# Patient Record
Sex: Male | Born: 1940 | Race: White | Hispanic: No | Marital: Married | Smoking: Former smoker
Health system: Southern US, Community
[De-identification: ages and names within clinical notes are randomized; demographics above are authoritative.]

## PROBLEM LIST (undated history)

## (undated) DIAGNOSIS — E7800 Pure hypercholesterolemia, unspecified: Secondary | ICD-10-CM

## (undated) DIAGNOSIS — Z973 Presence of spectacles and contact lenses: Secondary | ICD-10-CM

## (undated) DIAGNOSIS — I2510 Atherosclerotic heart disease of native coronary artery without angina pectoris: Secondary | ICD-10-CM

## (undated) DIAGNOSIS — I10 Essential (primary) hypertension: Secondary | ICD-10-CM

## (undated) DIAGNOSIS — G4700 Insomnia, unspecified: Secondary | ICD-10-CM

## (undated) DIAGNOSIS — J449 Chronic obstructive pulmonary disease, unspecified: Secondary | ICD-10-CM

## (undated) DIAGNOSIS — E785 Hyperlipidemia, unspecified: Secondary | ICD-10-CM

## (undated) DIAGNOSIS — R7301 Impaired fasting glucose: Secondary | ICD-10-CM

## (undated) DIAGNOSIS — E119 Type 2 diabetes mellitus without complications: Secondary | ICD-10-CM

## (undated) DIAGNOSIS — C61 Malignant neoplasm of prostate: Secondary | ICD-10-CM

## (undated) DIAGNOSIS — Z972 Presence of dental prosthetic device (complete) (partial): Secondary | ICD-10-CM

## (undated) DIAGNOSIS — C801 Malignant (primary) neoplasm, unspecified: Secondary | ICD-10-CM

## (undated) DIAGNOSIS — N400 Enlarged prostate without lower urinary tract symptoms: Secondary | ICD-10-CM

## (undated) DIAGNOSIS — N419 Inflammatory disease of prostate, unspecified: Secondary | ICD-10-CM

## (undated) DIAGNOSIS — I4891 Unspecified atrial fibrillation: Secondary | ICD-10-CM

## (undated) DIAGNOSIS — N179 Acute kidney failure, unspecified: Secondary | ICD-10-CM

## (undated) HISTORY — PX: HX TURP: SHX73

## (undated) HISTORY — DX: Malignant neoplasm of prostate (CMS HCC): C61

## (undated) HISTORY — PX: KNEE MASS EXCISION: SHX1896

## (undated) HISTORY — DX: Inflammatory disease of prostate, unspecified: N41.9

## (undated) HISTORY — DX: Benign prostatic hyperplasia without lower urinary tract symptoms: N40.0

## (undated) HISTORY — DX: Essential (primary) hypertension: I10

## (undated) HISTORY — DX: Type 2 diabetes mellitus without complications (CMS HCC): E11.9

## (undated) HISTORY — DX: Acute kidney failure, unspecified (CMS HCC): N17.9

## (undated) HISTORY — PX: HX CORONARY ARTERY BYPASS GRAFT: SHX141

## (undated) HISTORY — DX: Impaired fasting glucose: R73.01

## (undated) HISTORY — DX: Hyperlipidemia, unspecified: E78.5

## (undated) HISTORY — DX: Insomnia, unspecified: G47.00

## (undated) HISTORY — DX: Atherosclerotic heart disease of native coronary artery without angina pectoris: I25.10

## (undated) HISTORY — DX: Chronic obstructive pulmonary disease, unspecified: J44.9

---

## 1997-09-16 ENCOUNTER — Inpatient Hospital Stay
Admission: AD | Admit: 1997-09-16 | Disposition: A | Discharge status: Home / Self Care | Payer: Self-pay | Source: Other Acute Inpatient Hospital

## 2001-09-29 ENCOUNTER — Ambulatory Visit: Payer: Self-pay

## 2002-04-13 ENCOUNTER — Ambulatory Visit: Payer: Self-pay

## 2005-10-18 ENCOUNTER — Ambulatory Visit (HOSPITAL_COMMUNITY): Payer: Self-pay | Admitting: Family Medicine

## 2009-11-01 ENCOUNTER — Inpatient Hospital Stay
Admission: RE | Admit: 2009-11-01 | Discharge: 2009-11-05 | Disposition: A | Discharge status: Home / Self Care | Payer: Self-pay | Attending: Internal Medicine | Admitting: Internal Medicine

## 2009-11-01 ENCOUNTER — Other Ambulatory Visit: Payer: Self-pay

## 2009-11-01 ENCOUNTER — Emergency Department: Admit: 2009-11-01 | Payer: Self-pay

## 2009-11-02 LAB — ACTIVATED COAGULATION TIME - BMC ONLY
ACTIVATED COAGULATION TIME: 207 s — ABNORMAL HIGH (ref 76–180)
ACTIVATED COAGULATION TIME: 247 s — ABNORMAL HIGH (ref 76–180)

## 2009-11-03 ENCOUNTER — Other Ambulatory Visit: Payer: Self-pay | Admitting: Internal Medicine

## 2009-11-03 LAB — COMPREHENSIVE METABOLIC PROFILE - BMC/JMC ONLY
ALBUMIN: 3.1 g/dL — ABNORMAL LOW (ref 3.2–5.0)
ALKALINE PHOSPHATASE: 77 IU/L (ref 35–120)
ALT (SGPT): 26 IU/L (ref 0–63)
AST (SGOT): 61 IU/L — ABNORMAL HIGH (ref 0–45)
BILIRUBIN, TOTAL: 0.7 mg/dL (ref 0.0–1.3)
BUN: 10 mg/dL (ref 6–22)
CALCIUM: 9 mg/dL (ref 8.5–10.5)
CARBON DIOXIDE: 31 mmol/L (ref 22–32)
CHLORIDE: 102 mmol/L (ref 101–111)
CREATININE: 0.89 mg/dL (ref 0.72–1.30)
ESTIMATED GLOMERULAR FILTRATION RATE: >60 mL/min (ref >60)
GLUCOSE: 94 mg/dL (ref 70–110)
POTASSIUM: 4.6 mmol/L (ref 3.5–5.0)
SODIUM: 138 mmol/L (ref 136–145)
TOTAL PROTEIN: 5.8 g/dL — ABNORMAL LOW (ref 6.0–8.0)

## 2009-11-03 LAB — CBC
BASOPHIL #: 0.09 K/uL (ref 0.00–0.10)
BASOPHILS %: 0.9 % (ref 0.0–1.4)
EOSINOPHIL #: 0.31 K/uL (ref 0.00–0.50)
EOSINOPHIL %: 3.2 % (ref 0.0–5.2)
HCT: 39.9 % (ref 39.0–50.0)
HGB: 13.4 g/dL — ABNORMAL LOW (ref 13.5–18.0)
LYMPHOCYTE #: 1.85 K/uL (ref 0.70–3.20)
LYMPHOCYTE %: 18.9 % (ref 15.0–43.0)
MCH: 30.6 pg (ref 28.0–34.0)
MCHC: 33.7 g/dL (ref 33.0–37.0)
MCV: 90.8 fL (ref 83.0–97.0)
MONOCYTE #: 0.71 K/uL (ref 0.20–0.90)
MONOCYTE %: 7.2 % (ref 4.8–12.0)
MPV: 10.6 fL — ABNORMAL HIGH (ref 7.0–9.4)
PLATELET COUNT: 166 K/uL (ref 150–400)
PMN #: 6.85 K/uL — ABNORMAL HIGH (ref 1.50–6.50)
PMN %: 69.8 % (ref 43.0–76.0)
RBC: 4.39 M/uL (ref 4.30–5.40)
RDW: 11.8 % (ref 11.0–13.0)
WBC: 9.8 K/uL (ref 4.0–11.0)

## 2009-11-03 LAB — CREATINE KINASE (CK), TOTAL, SERUM OR PLASMA: CREATINE KINASE (CK): 389 IU/L (ref 0–250)

## 2009-11-03 LAB — PHOSPHORUS: PHOSPHORUS: 2.7 mg/dL (ref 2.4–4.7)

## 2009-11-03 LAB — PT/INR
INR NORMALIZED: 1.16
PROTHROMBIN TIME: 12 s — ABNORMAL HIGH (ref 9.8–11.0)

## 2009-11-03 LAB — TROPONIN-I: TROPONIN-I: 13.58 ng/mL — ABNORMAL HIGH (ref 0.00–0.06)

## 2009-11-03 LAB — MAGNESIUM: MAGNESIUM: 1.6 mg/dL — ABNORMAL LOW (ref 1.7–2.5)

## 2009-11-03 LAB — CREATINE KINASE (CK), MB FRACTION, SERUM: CK-MB: 45.7 ng/mL (ref 0.0–6.3)

## 2009-11-03 LAB — PTT (PARTIAL THROMBOPLASTIN TIME): APTT: 30.6 s (ref 24.1–32.3)

## 2009-11-04 ENCOUNTER — Other Ambulatory Visit: Payer: Self-pay | Admitting: Internal Medicine

## 2009-11-04 ENCOUNTER — Other Ambulatory Visit: Payer: Self-pay

## 2009-11-04 LAB — CBC
BASOPHIL #: 0.06 K/uL (ref 0.00–0.10)
BASOPHILS %: 0.6 % (ref 0.0–1.4)
EOSINOPHIL #: 0.35 K/uL (ref 0.00–0.50)
EOSINOPHIL %: 3.6 % (ref 0.0–5.2)
HCT: 38.5 % — ABNORMAL LOW (ref 39.0–50.0)
HGB: 13.5 g/dL (ref 13.5–18.0)
LYMPHOCYTE #: 1.78 K/uL (ref 0.70–3.20)
LYMPHOCYTE %: 18.5 % (ref 15.0–43.0)
MCH: 31.3 pg (ref 28.0–34.0)
MCHC: 35 g/dL (ref 33.0–37.0)
MCV: 89.4 fL (ref 83.0–97.0)
MONOCYTE #: 0.87 K/uL (ref 0.20–0.90)
MONOCYTE %: 9.1 % (ref 4.8–12.0)
MPV: 10 fL — ABNORMAL HIGH (ref 7.0–9.4)
PLATELET COUNT: 149 K/uL — ABNORMAL LOW (ref 150–400)
PMN #: 6.57 K/uL — ABNORMAL HIGH (ref 1.50–6.50)
PMN %: 68.3 % (ref 43.0–76.0)
RBC: 4.3 M/uL (ref 4.30–5.40)
RDW: 11.5 % (ref 11.0–13.0)
WBC: 9.6 K/uL (ref 4.0–11.0)

## 2009-11-04 LAB — BASIC METABOLIC PROFILE - BMC/JMC ONLY
BUN: 12 mg/dL (ref 6–22)
CALCIUM: 8.7 mg/dL (ref 8.5–10.5)
CARBON DIOXIDE: 30 mmol/L (ref 22–32)
CHLORIDE: 102 mmol/L (ref 101–111)
CREATININE: 0.88 mg/dL (ref 0.72–1.30)
ESTIMATED GLOMERULAR FILTRATION RATE: >60 mL/min (ref >60)
GLUCOSE: 104 mg/dL (ref 70–110)
POTASSIUM: 4.1 mmol/L (ref 3.5–5.0)
SODIUM: 138 mmol/L (ref 136–145)

## 2009-11-04 LAB — CREATINE KINASE (CK), MB FRACTION, SERUM: CK-MB: 7.7 ng/mL (ref 0.0–6.3)

## 2009-11-04 LAB — PT/INR
INR NORMALIZED: 1.16
PROTHROMBIN TIME: 12 s — ABNORMAL HIGH (ref 9.8–11.0)

## 2009-11-04 LAB — B-TYPE NATRIURETIC PEPTIDE (BNP),PLASMA: B-TYPE NATRIURETIC PEPTIDE: 215 pg/mL — ABNORMAL HIGH (ref 0–100)

## 2009-11-04 LAB — PTT (PARTIAL THROMBOPLASTIN TIME): APTT: 31.5 s (ref 24.1–32.3)

## 2009-11-04 LAB — TROPONIN-I: TROPONIN-I: 8.18 ng/mL (ref 0.00–0.06)

## 2009-11-04 LAB — CREATINE KINASE (CK), TOTAL, SERUM OR PLASMA: CREATINE KINASE (CK): 183 IU/L — AB (ref 0–250)

## 2009-11-05 ENCOUNTER — Other Ambulatory Visit: Payer: Self-pay | Admitting: Internal Medicine

## 2009-11-05 ENCOUNTER — Other Ambulatory Visit: Payer: Self-pay

## 2009-11-05 LAB — CBC
BASOPHIL #: 0.06 K/uL (ref 0.00–0.10)
BASOPHILS %: 0.8 % (ref 0.0–1.4)
EOSINOPHIL #: 0.32 K/uL (ref 0.00–0.50)
EOSINOPHIL %: 4.3 % (ref 0.0–5.2)
HCT: 38.7 % — ABNORMAL LOW (ref 39.0–50.0)
HGB: 13.5 g/dL (ref 13.5–18.0)
LYMPHOCYTE #: 1.56 K/uL (ref 0.70–3.20)
LYMPHOCYTE %: 21.1 % (ref 15.0–43.0)
MCH: 31 pg (ref 28.0–34.0)
MCHC: 34.9 g/dL (ref 33.0–37.0)
MCV: 88.8 fL (ref 83.0–97.0)
MONOCYTE #: 0.72 K/uL (ref 0.20–0.90)
MONOCYTE %: 9.7 % (ref 4.8–12.0)
MPV: 9.9 fL — ABNORMAL HIGH (ref 7.0–9.4)
PLATELET COUNT: 147 K/uL — ABNORMAL LOW (ref 150–400)
PMN #: 4.76 K/uL (ref 1.50–6.50)
PMN %: 64.2 % (ref 43.0–76.0)
RBC: 4.36 M/uL (ref 4.30–5.40)
RDW: 11.3 % (ref 11.0–13.0)
WBC: 7.4 K/uL (ref 4.0–11.0)

## 2009-11-05 LAB — TROPONIN-I: TROPONIN-I: 4.58 ng/mL (ref 0.00–0.06)

## 2009-11-05 LAB — CREATINE KINASE (CK), MB FRACTION, SERUM: CK-MB: 4.2 ng/mL — AB (ref 0.0–6.3)

## 2009-11-05 LAB — CREATINE KINASE (CK), TOTAL, SERUM OR PLASMA: CREATINE KINASE (CK): 126 IU/L (ref 0–250)

## 2013-05-29 ENCOUNTER — Ambulatory Visit (INDEPENDENT_AMBULATORY_CARE_PROVIDER_SITE_OTHER): Payer: Self-pay | Admitting: Urology

## 2015-04-21 DIAGNOSIS — R339 Retention of urine, unspecified: Secondary | ICD-10-CM

## 2016-01-30 ENCOUNTER — Ambulatory Visit (HOSPITAL_COMMUNITY): Payer: Self-pay | Admitting: Radiation Oncology

## 2016-02-25 DIAGNOSIS — I10 Essential (primary) hypertension: Secondary | ICD-10-CM

## 2016-02-25 DIAGNOSIS — C61 Malignant neoplasm of prostate: Secondary | ICD-10-CM

## 2016-02-25 DIAGNOSIS — E119 Type 2 diabetes mellitus without complications: Secondary | ICD-10-CM

## 2016-03-23 ENCOUNTER — Encounter (INDEPENDENT_AMBULATORY_CARE_PROVIDER_SITE_OTHER): Payer: Self-pay | Admitting: Family Medicine

## 2016-04-13 ENCOUNTER — Encounter (INDEPENDENT_AMBULATORY_CARE_PROVIDER_SITE_OTHER): Payer: Self-pay

## 2016-04-15 ENCOUNTER — Encounter (INDEPENDENT_AMBULATORY_CARE_PROVIDER_SITE_OTHER): Payer: Self-pay | Admitting: Family Medicine

## 2016-04-23 ENCOUNTER — Encounter (HOSPITAL_BASED_OUTPATIENT_CLINIC_OR_DEPARTMENT_OTHER): Payer: Self-pay

## 2016-04-23 ENCOUNTER — Emergency Department (HOSPITAL_BASED_OUTPATIENT_CLINIC_OR_DEPARTMENT_OTHER)
Admission: EM | Admit: 2016-04-23 | Discharge: 2016-04-23 | Disposition: A | Discharge status: Home / Self Care | Payer: Commercial Managed Care - PPO | Attending: Emergency Medicine | Admitting: Emergency Medicine

## 2016-04-23 DIAGNOSIS — F17200 Nicotine dependence, unspecified, uncomplicated: Secondary | ICD-10-CM | POA: Insufficient documentation

## 2016-04-23 DIAGNOSIS — Z716 Tobacco abuse counseling: Secondary | ICD-10-CM | POA: Insufficient documentation

## 2016-04-23 DIAGNOSIS — Z79899 Other long term (current) drug therapy: Secondary | ICD-10-CM | POA: Insufficient documentation

## 2016-04-23 DIAGNOSIS — M545 Low back pain: Secondary | ICD-10-CM | POA: Insufficient documentation

## 2016-04-23 DIAGNOSIS — Z7902 Long term (current) use of antithrombotics/antiplatelets: Secondary | ICD-10-CM | POA: Insufficient documentation

## 2016-04-23 DIAGNOSIS — I2510 Atherosclerotic heart disease of native coronary artery without angina pectoris: Secondary | ICD-10-CM | POA: Insufficient documentation

## 2016-04-23 DIAGNOSIS — I10 Essential (primary) hypertension: Secondary | ICD-10-CM | POA: Insufficient documentation

## 2016-04-23 DIAGNOSIS — Z951 Presence of aortocoronary bypass graft: Secondary | ICD-10-CM | POA: Insufficient documentation

## 2016-04-23 DIAGNOSIS — E7800 Pure hypercholesterolemia, unspecified: Secondary | ICD-10-CM | POA: Insufficient documentation

## 2016-04-23 HISTORY — DX: Pure hypercholesterolemia, unspecified: E78.00

## 2016-04-23 HISTORY — DX: Presence of spectacles and contact lenses: Z97.3

## 2016-04-23 HISTORY — DX: Essential (primary) hypertension: I10

## 2016-04-23 HISTORY — DX: Atherosclerotic heart disease of native coronary artery without angina pectoris: I25.10

## 2016-04-23 MED ORDER — BACLOFEN 10 MG TABLET: 10 mg | Tab | Freq: Two times a day (BID) | ORAL | 0 refills | 0 days | Status: AC | PRN

## 2016-04-23 NOTE — ED Provider Notes (Signed)
Larry Kanner, PA-C  [facility]  Emergency Department Visit Note    Date: 04/23/2016  Primary care provider: Sid Falcon, MD  Means of arrival: private car  History obtained by: patient  History limited by: none      Chief Complaint: low back pain    History of Present Illness     [name], date of birth 08/02/1940, is a 76 y.o. male who presents to the Emergency Department complaining of right low back pain that was present when he woke and he attributed to a muscle strain. He does acknowledge that he had been lifting heavy objects 2 days ago. He states the pain was mild, local to right low back without radiation or distal numbness or tingling. He denies changes in his bowel or bladder pattern or saddle paresthesia.  The patient states his pain is now resolved.    Denies fever, chills, sore throat, ear drainage or pain, cough, sob, wheezing, hemoptysis, chest pain, nausea, vomiting, abdominal pain, diarrhea, urinary symptoms, back or flank pain, calf pain, peripheral edema or rash.    Review of Systems     The pertinent positive and negative symptoms are as per HPI. All other systems reviewed and are negative.    Patient History      Past Medical History:  Past Medical History:   Diagnosis Date   . Coronary artery disease    . High cholesterol    . HTN (hypertension)    . Wears glasses      Past Surgical History:  Past Surgical History:   Procedure Laterality Date   . HX CORONARY ARTERY BYPASS GRAFT       Family History:  Family Medical History     None        Social History:  Social History   Substance Use Topics   . Smoking status: Current Every Day Smoker     Packs/day: 1.00   . Smokeless tobacco: Never Used   . Alcohol use No     History   Drug Use No       Medications:  Outpatient Prescriptions Marked as Taking for the 04/23/16 encounter [facility] Encounter)   Medication Sig   . baclofen (LIORESAL) 10 mg Oral Tablet Take 1 Tab (10 mg total) by mouth Twice per day as needed (muscle spasm) for up  to 2 days   . carvedilol (COREG) 12.5 mg Oral Tablet Take 12.5 mg by mouth Twice daily with food   . clopidogrel (PLAVIX) 75 mg Oral Tablet Take 75 mg by mouth Once a day   . lisinopril (PRINIVIL) 20 mg Oral Tablet Take 20 mg by mouth Once a day   . simvastatin (ZOCOR) 40 mg Oral Tablet Take 40 mg by mouth Every evening       Allergies:   No Known Allergies    Physical Exam     Vital Signs:  ED Triage Vitals   Enc Vitals Group      BP (Non-Invasive) 04/23/16 1822 191/69      Heart Rate 04/23/16 1822 71      Respiratory Rate 04/23/16 1822 20      Temperature 04/23/16 1822 36.5 C (97.7 F)      Temp src --       SpO2-1 04/23/16 1822 97 %      Weight 04/23/16 1822 67.6 kg (149 lb)      Height --       Head Cir --  Peak Flow --       Pain Score --       Pain Loc --       Pain Edu? --       Excl. in GC? --        The initial visit vital signs are reviewed as above.     Pulse Ox: 97% on None (Room Air); interpreted by me as:  Normal  GENERAL:  This is a well appearing  76 y.o.  male  who is interactive, appropriate and showing no outward signs of distress.    HEENT:  Atraumatic, normocephalic.  Anicteric.  PERRL.  Conjunctiva normal in appearance.  Oropharynx is clear.  Mucous membranes are moist.     NECK:  Supple, no nuchal rigidity or meningeal signs. Trachea is midline. No anterior cervical adenopathy.  CHEST:  No signs of trauma.  Normal and equal expansion of the thorax while breathing.  HEART:  Regular rate and rhythm without murmurs, rubs, or gallops.  LUNGS:  Clear to ascultation bilaterally without any adventitious sounds.  ABDOMEN:  Non-distended.  Bowel sounds present. Soft, non-tender to palpation.  BACK:  Right lumbar paravertebral muscle spasm. Non-tender to palpation.  No CVAT. Full lumbar flexion, extension and rotation.  SKIN:  Warm, good color.  No obvious significant rashes noted. No diaphoresis.  EXTREMITY:  Good range of motion.  Normal strength throughout.  No calf tenderness or peripheral  edema noted.  NEURO/PSYCH:  Patient is interactive, appropriate and no gross abnormal neurological findings. Patella and Achilles tendon reflexes are brisk and symmetric.     Diagnostics     Labs:  No results found for any visits on 04/23/16.    Radiology:   None:      ED Progress Note/Medical Decision Making     Orders Placed This Encounter   . baclofen (LIORESAL) 10 mg Oral Tablet       1915: Patient was initially evaluated by me, possible etiologies for symptoms were discussed with the patient. He was educated that currently there is no indication for further testing and that his symptoms may be managed with a muscle relaxer and nsaids. The patient verbalized understanding and was in agreement with the proposed care plan at this time.      I have screened the patient for tobacco use in the patient is a tobacco user.  I have counseled the patient for less than 3 minutes to quit using tobacco products due to the multiple adverse health effects.      Pre-hypertension/Hypertension:  The patient has been informed that they may have pre-hypertension or Hypertension based on elevated blood pressure reading in the ED.  I recommend that the patient call the provider listed on the discharge instructions or a physician of their choice to arrange follow-up for further evaluation of possible pre-hypertension or Hypertension within the next week.      Pre-Disposition Vitals:  Filed Vitals:    04/23/16 1822   BP: (!) 191/69   Pulse: 71   Resp: 20   Temp: 36.5 C (97.7 F)   SpO2: 97%       Clinical Impression      1. Acute low back pain  2. Tobacco Abuse - Smoking Cessation Advisement  3.   Pre-Hypertension/Hypertension Advisement    Plan/Disposition     The Emergency Department impression was discussed in detail. There is no reasonable probability of an unstable emergency medical condition.  There is no indication for further evaluation or  testing at this time.  The patient is currently stable for discharge.  All questions and  concerns were answered to their satisfaction and they agree with the plan.  Indications for immediate return to the emergency department and the importance of timely follow up were discussed.       Discharged    Prescriptions:  Discharge Medication List as of 04/23/2016  7:21 PM      START taking these medications    Details   baclofen (LIORESAL) 10 mg Oral Tablet Take 1 Tab (10 mg total) by mouth Twice per day as needed (muscle spasm) for up to 2 days, Disp-4 Tab, R-0, Print             Follow Up:  Sid Falcon, MD  [address]  [place]  [place] [address]  [phone]    Call in 3 days      [facility]  [address]  [place] [address]  [phone]    If symptoms worsen      Condition on Disposition: Stable

## 2016-04-23 NOTE — ED Triage Notes (Signed)
Woke up today with low back pain - denies injury

## 2016-04-23 NOTE — ED Nurses Note (Signed)
Current Discharge Medication List      START taking these medications.       Details    baclofen 10 mg Tablet   Commonly known as:  LIORESAL    10 mg, Oral, 2x/day PRN   Qty:  4 Tab   Refills:  0         CONTINUE these medications - NO CHANGES were made during your visit.       Details    carvedilol 12.5 mg Tablet   Commonly known as:  COREG    12.5 mg, Oral, 2x/day-Food   Refills:  0       clopidogrel 75 mg Tablet   Commonly known as:  PLAVIX    75 mg, Oral, Daily   Refills:  0       lisinopril 20 mg Tablet   Commonly known as:  PRINIVIL    20 mg, Oral, Daily   Refills:  0       simvastatin 40 mg Tablet   Commonly known as:  ZOCOR    40 mg, Oral, QPM   Refills:  0         Patient discharged home with family.  AVS reviewed with patient/care giver.  A written copy of the AVS and discharge instructions was given to the patient/care giver.  Questions sufficiently answered as needed.  Patient/care giver encouraged to follow up with PCP as indicated.  In the event of an emergency, patient/care giver instructed to call 911 or go to the nearest emergency room.

## 2016-06-01 ENCOUNTER — Other Ambulatory Visit (INDEPENDENT_AMBULATORY_CARE_PROVIDER_SITE_OTHER): Payer: Self-pay | Admitting: Family Medicine

## 2016-06-01 MED ORDER — LOSARTAN 100 MG TABLET
100.0000 mg | ORAL_TABLET | Freq: Every day | ORAL | 3 refills | Status: DC
Start: 2016-06-01 — End: 2016-08-23

## 2016-08-23 ENCOUNTER — Other Ambulatory Visit (INDEPENDENT_AMBULATORY_CARE_PROVIDER_SITE_OTHER): Payer: Self-pay | Admitting: Family Medicine

## 2016-08-23 MED ORDER — LOSARTAN 100 MG TABLET
100.0000 mg | ORAL_TABLET | Freq: Every day | ORAL | 3 refills | Status: DC
Start: 2016-08-23 — End: 2017-05-14

## 2016-08-23 MED ORDER — AMLODIPINE 5 MG TABLET
5.0000 mg | ORAL_TABLET | Freq: Every day | ORAL | 3 refills | Status: DC
Start: 2016-08-23 — End: 2017-05-14

## 2016-11-30 ENCOUNTER — Other Ambulatory Visit (INDEPENDENT_AMBULATORY_CARE_PROVIDER_SITE_OTHER): Payer: Self-pay | Admitting: Family Medicine

## 2016-11-30 MED ORDER — JANUMET 50 MG-500 MG TABLET
ORAL_TABLET | ORAL | 3 refills | Status: AC
Start: 2016-11-30 — End: ?

## 2017-05-14 ENCOUNTER — Encounter (HOSPITAL_COMMUNITY): Payer: Self-pay

## 2017-05-14 ENCOUNTER — Emergency Department (HOSPITAL_COMMUNITY): Payer: Medicare Other

## 2017-05-14 ENCOUNTER — Inpatient Hospital Stay (HOSPITAL_COMMUNITY): Payer: Medicare Other | Admitting: Internal Medicine

## 2017-05-14 ENCOUNTER — Inpatient Hospital Stay
Admission: EM | Admit: 2017-05-14 | Discharge: 2017-05-20 | DRG: 312 | Disposition: A | Discharge status: Home / Self Care | Payer: Medicare Other | Attending: Internal Medicine | Admitting: Internal Medicine

## 2017-05-14 ENCOUNTER — Other Ambulatory Visit (HOSPITAL_BASED_OUTPATIENT_CLINIC_OR_DEPARTMENT_OTHER): Payer: Self-pay | Admitting: EXTERNAL

## 2017-05-14 ENCOUNTER — Ambulatory Visit (HOSPITAL_BASED_OUTPATIENT_CLINIC_OR_DEPARTMENT_OTHER)
Admission: RE | Admit: 2017-05-14 | Discharge: 2017-05-14 | Disposition: A | Discharge status: Home / Self Care | Payer: Commercial Managed Care - PPO | Source: Ambulatory Visit | Attending: EXTERNAL | Admitting: EXTERNAL

## 2017-05-14 DIAGNOSIS — R05 Cough: Principal | ICD-10-CM

## 2017-05-14 DIAGNOSIS — J849 Interstitial pulmonary disease, unspecified: Secondary | ICD-10-CM | POA: Insufficient documentation

## 2017-05-14 DIAGNOSIS — R059 Cough, unspecified: Secondary | ICD-10-CM

## 2017-05-14 DIAGNOSIS — E119 Type 2 diabetes mellitus without complications: Secondary | ICD-10-CM

## 2017-05-14 DIAGNOSIS — Z87891 Personal history of nicotine dependence: Secondary | ICD-10-CM

## 2017-05-14 DIAGNOSIS — R338 Other retention of urine: Secondary | ICD-10-CM

## 2017-05-14 DIAGNOSIS — C61 Malignant neoplasm of prostate: Secondary | ICD-10-CM | POA: Diagnosis present

## 2017-05-14 DIAGNOSIS — R55 Syncope and collapse: Secondary | ICD-10-CM

## 2017-05-14 DIAGNOSIS — E1143 Type 2 diabetes mellitus with diabetic autonomic (poly)neuropathy: Secondary | ICD-10-CM | POA: Diagnosis present

## 2017-05-14 DIAGNOSIS — C7951 Secondary malignant neoplasm of bone: Secondary | ICD-10-CM | POA: Diagnosis present

## 2017-05-14 DIAGNOSIS — I480 Paroxysmal atrial fibrillation: Secondary | ICD-10-CM | POA: Diagnosis present

## 2017-05-14 DIAGNOSIS — R339 Retention of urine, unspecified: Secondary | ICD-10-CM

## 2017-05-14 DIAGNOSIS — Z7984 Long term (current) use of oral hypoglycemic drugs: Secondary | ICD-10-CM

## 2017-05-14 DIAGNOSIS — Z7901 Long term (current) use of anticoagulants: Secondary | ICD-10-CM

## 2017-05-14 DIAGNOSIS — N401 Enlarged prostate with lower urinary tract symptoms: Secondary | ICD-10-CM | POA: Diagnosis present

## 2017-05-14 DIAGNOSIS — Z66 Do not resuscitate: Secondary | ICD-10-CM | POA: Diagnosis present

## 2017-05-14 DIAGNOSIS — I951 Orthostatic hypotension: Principal | ICD-10-CM | POA: Diagnosis present

## 2017-05-14 DIAGNOSIS — N390 Urinary tract infection, site not specified: Secondary | ICD-10-CM | POA: Diagnosis not present

## 2017-05-14 DIAGNOSIS — E871 Hypo-osmolality and hyponatremia: Secondary | ICD-10-CM

## 2017-05-14 HISTORY — DX: Malignant (primary) neoplasm, unspecified (CMS HCC): C80.1

## 2017-05-14 HISTORY — DX: Unspecified atrial fibrillation (CMS HCC): I48.91

## 2017-05-14 HISTORY — DX: Presence of spectacles and contact lenses: Z97.3

## 2017-05-14 HISTORY — DX: Presence of dental prosthetic device (complete) (partial): Z97.2

## 2017-05-14 LAB — URINALYSIS, MACRO/MICRO
GLUCOSE: NEGATIVE mg/dL
NITRITE: NEGATIVE
PH: 6 (ref 5.0–7.0)
SPECIFIC GRAVITY: 1.016 (ref 1.010–1.025)
UROBILINOGEN: 1 mg/dL

## 2017-05-14 LAB — MANUAL DIFFERENTIAL
LYMPHOCYTE ABSOLUTE: 0.47 10*3/uL
METAMYELOCYTE %: 6 %
METAMYELOCYTE %: 6 %
METAMYELOCYTE ABSOLUTE: 0.28 10*3/uL
MONOCYTE %: 8 % (ref 2–11)
MONOCYTE ABSOLUTE: 0.38 10*3/uL
MYELOCYTE %: 3 %
MYELOCYTE ABSOLUTE: 0.14 10*3/uL
NEUTROPHIL %: 73 % — ABNORMAL HIGH (ref 50–70)
NEUTROPHIL ABSOLUTE: 3.43 10*3/uL
WBC MORPHOLOGY COMMENT: NORMAL
WBC: 4.7 10*3/uL

## 2017-05-14 LAB — TROPONIN-I
TROPONIN I: 0.02 ng/mL (ref <=0.04)
TROPONIN I: 0.02 ng/mL (ref <=0.04)

## 2017-05-14 LAB — CBC WITH DIFF
BASOPHIL #: 0 10*3/uL (ref 0.00–0.20)
BASOPHIL %: 0 %
EOSINOPHIL #: 0 10*3/uL (ref 0.00–0.50)
EOSINOPHIL %: 0 %
HCT: 30.5 % — ABNORMAL LOW (ref 38.9–50.5)
HGB: 10.1 g/dL — ABNORMAL LOW (ref 13.4–17.3)
LYMPHOCYTE #: 0.7 10*3/uL — ABNORMAL LOW (ref 0.80–3.20)
LYMPHOCYTE %: 15 %
MCH: 32.6 pg (ref 27.9–33.1)
MCH: 32.6 pg (ref 27.9–33.1)
MCHC: 33 g/dL (ref 32.8–36.0)
MCV: 98.7 fL — ABNORMAL HIGH (ref 82.4–95.0)
MONOCYTE #: 0.5 10*3/uL (ref 0.20–0.80)
MONOCYTE %: 11 %
MPV: 7.5 fL (ref 6.0–10.2)
NEUTROPHIL #: 3.4 10*3/uL (ref 1.60–5.50)
NEUTROPHIL %: 73 %
NEUTROPHIL %: 73 %
PLATELETS: 231 10*3/uL (ref 140–440)
RBC: 3.09 10*6/uL — ABNORMAL LOW (ref 4.40–5.68)
RBC: 3.09 x10?6/uL — ABNORMAL LOW (ref 4.40–5.68)
RDW: 21.4 % — ABNORMAL HIGH (ref 10.9–15.1)
WBC: 4.7 10*3/uL (ref 3.3–9.3)

## 2017-05-14 LAB — BASIC METABOLIC PANEL
ANION GAP: 10 mmol/L
BUN/CREA RATIO: 32
BUN: 21 mg/dL (ref 10–25)
CALCIUM: 8.7 mg/dL — ABNORMAL LOW (ref 8.8–10.3)
CHLORIDE: 91 mmol/L — ABNORMAL LOW (ref 98–111)
CO2 TOTAL: 22 mmol/L (ref 21–35)
CREATININE: 0.66 mg/dL (ref <=1.30)
ESTIMATED GFR: >60 mL/min/{1.73_m2}
GLUCOSE: 155 mg/dL — ABNORMAL HIGH (ref 70–110)
POTASSIUM: 4.9 mmol/L (ref 3.5–5.0)
SODIUM: 123 mmol/L — ABNORMAL LOW (ref 135–145)

## 2017-05-14 LAB — OSMOLALITY, RANDOM URINE: OSMOLALITY URINE: 610 mOsm/kg (ref 250–900)

## 2017-05-14 LAB — OSMOLALITY: OSMOLALITY, BLOOD: 278 mOsm/kg — ABNORMAL LOW (ref 280–300)

## 2017-05-14 LAB — B-TYPE NATRIURETIC PEPTIDE
BNP: 68 pg/mL (ref 0–100)
BNP: 68 pg/mL (ref 0–100)

## 2017-05-14 LAB — PTT (PARTIAL THROMBOPLASTIN TIME): APTT: 27.7 seconds (ref 25.0–37.0)

## 2017-05-14 LAB — SODIUM, RANDOM URINE: SODIUM RANDOM URINE: 18 mmol/L

## 2017-05-14 MED ORDER — ENOXAPARIN 40 MG/0.4 ML SUBCUTANEOUS SYRINGE
40.0000 mg | INJECTION | SUBCUTANEOUS | Status: DC
Start: 2017-05-14 — End: 2017-05-20
  Administered 2017-05-14 – 2017-05-19 (×6): 0 mg via SUBCUTANEOUS
  Filled 2017-05-14 (×9): qty 0.4

## 2017-05-14 MED ORDER — SODIUM CHLORIDE 0.9 % (FLUSH) INJECTION SYRINGE
3.0000 mL | INJECTION | Freq: Three times a day (TID) | INTRAMUSCULAR | Status: DC
Start: 2017-05-14 — End: 2017-05-20
  Administered 2017-05-14 – 2017-05-15 (×2): 0 mL
  Administered 2017-05-15: 3 mL
  Administered 2017-05-15 – 2017-05-16 (×2): 0 mL
  Administered 2017-05-16: 3 mL
  Administered 2017-05-17 – 2017-05-20 (×9): 0 mL

## 2017-05-14 MED ORDER — SITAGLIPTIN PHOSPHATE 50 MG TABLET
50.00 mg | ORAL_TABLET | Freq: Two times a day (BID) | ORAL | Status: DC
Start: 2017-05-15 — End: 2017-05-18
  Administered 2017-05-15 – 2017-05-17 (×6): 50 mg via ORAL
  Administered 2017-05-18: 08:00:00 0 mg via ORAL
  Filled 2017-05-14 (×10): qty 1

## 2017-05-14 MED ORDER — METOPROLOL TARTRATE 25 MG TABLET
12.50 mg | ORAL_TABLET | Freq: Two times a day (BID) | ORAL | Status: DC
Start: 2017-05-14 — End: 2017-05-14
  Filled 2017-05-14 (×3): qty 0.5

## 2017-05-14 MED ORDER — SITAGLIPTIN PHOSPHATE 50 MG-METFORMIN 500 MG TABLET
1.00 | ORAL_TABLET | Freq: Two times a day (BID) | ORAL | Status: DC
Start: 2017-05-15 — End: 2017-05-14

## 2017-05-14 MED ORDER — PREDNISONE 5 MG TABLET
5.00 mg | ORAL_TABLET | Freq: Two times a day (BID) | ORAL | Status: DC
Start: 2017-05-15 — End: 2017-05-20
  Administered 2017-05-15 – 2017-05-20 (×12): 5 mg via ORAL
  Filled 2017-05-14 (×14): qty 1

## 2017-05-14 MED ORDER — TAMSULOSIN 0.4 MG CAPSULE
0.4000 mg | ORAL_CAPSULE | Freq: Every day | ORAL | Status: DC
Start: 2017-05-15 — End: 2017-05-18
  Administered 2017-05-15 – 2017-05-18 (×4): 0 mg via ORAL
  Filled 2017-05-14 (×5): qty 1

## 2017-05-14 MED ORDER — DEXTROSE 50 % IN WATER (D50W) INTRAVENOUS SYRINGE
25.0000 g | INJECTION | INTRAVENOUS | Status: DC | PRN
Start: 2017-05-14 — End: 2017-05-20

## 2017-05-14 MED ORDER — INSULIN LISPRO 100 UNIT/ML SUBCUTANEOUS SSIP - ~~LOC~~/GRMC
1.0000 [IU] | Freq: Four times a day (QID) | SUBCUTANEOUS | Status: DC
Start: 2017-05-14 — End: 2017-05-20
  Administered 2017-05-14 – 2017-05-15 (×5): 0 [IU] via SUBCUTANEOUS
  Administered 2017-05-16: 2 [IU] via SUBCUTANEOUS
  Administered 2017-05-16 – 2017-05-20 (×17): 0 [IU] via SUBCUTANEOUS
  Filled 2017-05-14: qty 300

## 2017-05-14 MED ORDER — SODIUM CHLORIDE 0.9 % (FLUSH) INJECTION SYRINGE
3.0000 mL | INJECTION | INTRAMUSCULAR | Status: DC | PRN
Start: 2017-05-14 — End: 2017-05-20

## 2017-05-14 MED ORDER — ASPIRIN 81 MG CHEWABLE TABLET
81.0000 mg | CHEWABLE_TABLET | Freq: Every day | ORAL | Status: DC
Start: 2017-05-15 — End: 2017-05-20
  Administered 2017-05-15 – 2017-05-20 (×3): 81 mg via ORAL
  Filled 2017-05-14 (×7): qty 1

## 2017-05-14 MED ORDER — ACETAMINOPHEN 325 MG TABLET
650.0000 mg | ORAL_TABLET | Freq: Four times a day (QID) | ORAL | Status: DC | PRN
Start: 2017-05-14 — End: 2017-05-20

## 2017-05-14 MED ORDER — METFORMIN 500 MG TABLET
500.00 mg | ORAL_TABLET | Freq: Two times a day (BID) | ORAL | Status: DC
Start: 2017-05-15 — End: 2017-05-18
  Administered 2017-05-15 – 2017-05-17 (×6): 500 mg via ORAL
  Administered 2017-05-18: 0 mg via ORAL
  Filled 2017-05-14 (×10): qty 1

## 2017-05-14 NOTE — ED Nurses Note (Signed)
Patient refused his CT head because he was already scanned at his home hospital.

## 2017-05-14 NOTE — H&P (Signed)
[place]  Hospitalist  History and Physical    Joe [place]  Date of Admission:  05/14/2017  Date of Birth:  1940-08-12    PCP: Joe Sours, MD  Chief Complaint:    Chief Complaint   Patient presents with   . Syncope       HPI: Joe [place] is a 77 y.o., White male with a past medical history of paroxysmal atrial fibrillation diagnosed 3-4 months ago who presents with complaints of recurrent syncope.  Syncopal episode was experience today after ambulating 20 ft when attempting to get into his [place].  The patient reports position changes such as standing and ambulating are directly related to his episodes.  He does believe that his heart rate is significantly elevated and his blood pressure is low during these episodes.  He has had dose adjustments made to his metoprolol from 50 mg twice daily to 75 mg twice daily with worsening of his syncopal episodes in relation to this medication change.  The patient reports he is unable to walk more than 10 or 20 feet at any given time without suffering a "blackout".  He has been seen by electrophysiologist in [place] who recommended discontinuing Flomax to avoid any further hypotension, as a result the patient has suffered urinary retention and has had to have a Foley catheter inserted last week at [place].  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  Due to his urinary issues, the patient reports that he drinks copious amounts of water daily to keep his ureteral stent and and Foley catheter draining well.  He denies dizziness, confusion, or headaches. We discussed the possibility of hypervolemic hyponatremia at the bedside as well as consulting Cardiology for evaluation of alternate treatments to beta-blockers for his underlying paroxysmal atrial fibrillation.  Will check orthostatic vital signs and reduce the dose of his metoprolol and  resume Flomax once daily.        Past Medical History:   Diagnosis Date   . Acute renal failure (CMS HCC)    . Atrial fibrillation (CMS HCC)     paroxismal   . BPH (benign prostatic hyperplasia)    . Diabetes mellitus, type 2 (CMS HCC)    . Essential hypertension    . Prostate CA (CMS [place])    . Prostatitis            Past Surgical History:   Procedure Laterality Date   . HX TURP     . KNEE MASS EXCISION             Medications Prior to Admission     Prescriptions    aspirin 81 mg Oral Tablet, Chewable    Take 81 mg by mouth Once a day    JANUMET 50-500 mg Oral Tablet    TAKE 1 TABLET BY MOUTH TWICE A DAY    metoprolol tartrate (LOPRESSOR) 50 mg Oral Tablet    Take 50 mg by mouth Twice daily    tamsulosin (FLOMAX) 0.4 mg Oral Capsule, Sust. Release 24 hr    TAKE ONE CAPSULE BY MOUTH TWICE A DAY          Allergies   Allergen Reactions   . Ciprofloxacin        Social History     Tobacco Use   . Smoking status: Former Research scientist (life sciences)   . Smokeless tobacco: Never Used  Substance Use Topics   . Alcohol use: Not Currently       Family History:   Family Medical History:     Problem Relation (Age of Onset)    Coronary Artery Disease Mother    Diabetes Mother              ROS:   Constitutional:  No fevers or chills.  Weakness associated with recurrent syncopal episodes and decreased level of activity  Skin: No rash or diaphoresis  HENT: No headaches or congestion  Eyes: No vision changes   Cardio: No chest pain, palpitations or leg swelling   Respiratory: No cough, wheezing or SOB  GI:  No nausea, vomiting or stool changes  GU:  Urinary retention requiring Foley catheter secondary to BPH/prostate cancer, scheduled to see Urology in [place] on April 1st.  Has chronic microscopic hematuria  MSK: No joint or back pain  Neuro:  Syncopal episode today without acute injury, recurrent episodes of syncope over the last 3-4 months, see HPI  Psychiatric: No depression, SI or substance abuse  All other systems reviewed and are  negative.    EXAM:  Vitals: BP (!) 131/59   Pulse 93   Temp 36.7 C (98.1 F)   Resp (!) 22   Ht 1.727 m (5\' 8" )   Wt 80.7 kg (178 lb)   SpO2 100%   BMI 27.06 kg/m       Nursing note and vitals reviewed.   Constitutional: Patient is in no acute distress.    HEENT: Head normocephalic and atraumatic.   Eyes: PERRL, conjunctiva is without erythema or discharge.  Neck: Supple.  Lungs: Clear to auscultation bilaterally without wheezes, rales or rhonchi.  Cardiovascular: Regular rate and rhythm.  Currently in sinus rhythm on telemetry. No murmur, rub, or gallop. Pulses strong and equal bilaterally.  No peripheral edema.  Abdomen: Normal bowel sounds. Soft, nontender, nondistended. No rebound, Guarding, or masses noted.   Genitourinary:  Foley catheter present to bedside drainage with medium yellow urine in collection bag  MSK/Extremities: Moves all extremities. 5/5 strength throughout.  Skin: No lesions noted.  No rashes.  Neuro: . Normal coordination. CNs 2-12 grossly intact. No facial droop noted.    Psych: Alert and oriented to person, place, and time. Normal affect    Labs:    CBC  (Last 24 hours)    Date/Time WBC HGB HCT MCV PLATELETS    05/14/17 1523 4.7    10.1 (L)    30.5 (L)    98.7 (H)    231       05/14/17 1523 4.7    -- -- -- --      WBC/Diff  (Last 24 hours)    Date/Time WBC Band PMN Lymp Mono Eos Baso Prom Myelo Meta Blasts    05/14/17 1523 4.7    -- 73    15    11    0    0    -- -- -- --    05/14/17 1523 4.7    -- 73 (H)    10 (L)    8    -- -- -- 3    6    --          BMP  (Last 24 hours)    Date/Time Na K Cl CO2 BUN CREAT Calcium Glucose    05/14/17 1523 123 (L)    4.9    91 (L)    22    21  0.66    8.7 (L)    155 (H)             Micro: No results found for any visits on 05/14/17 (from the past 96 hour(s)).    Radiology:    Results for orders placed or performed during the [place] encounter of 05/14/17 (from the past 24 hour(s))   XR CHEST PA AND LATERAL     Status: None    Narrative    [name]    PROCEDURE DESCRIPTION: XR CHEST PA AND LATERAL    PROCEDURE PERFORMED DATE AND TIME: 05/14/2017 3:33 PM    CLINICAL INDICATION: syncope    TECHNIQUE: 2 views / 2 images submitted.    COMPARISON: No prior studies were compared.      FINDINGS: No evidence of pneumonia, pneumothorax. No pulmonary edema. Trace  bilateral pleural effusions. Cardiac mediastinal silhouette is within  normal limits. Right IJ chest port with tip terminating in expected  location of the central SVC.      Impression    Trace bilateral pleural effusions.        Radiologist location ID: [id number]         Assessment/ Plan:   [place] Problems    Diagnosis   . Hyponatremia   . Syncope   . Paroxysmal atrial fibrillation (CMS HCC)   . Urinary retention due to benign prostatic hyperplasia   . Diabetes (CMS [place])       Syncope  -likely orthostatic hypotension versus postural orthostatic tachycardia  -decrease metoprolol to 12.5 mg p.o. B.i.d.  -monitor on telemetry  -check orthostatic vital signs  -consult Cardiology  -high fall risk precautions    Hyponatremia  -likely hypervolemic, patient does not appear volume depleted urine specific gravity is within normal limits  -check serum osmolality, urine osmolality and urine sodium  -fluid restrictions  -repeat BMP at 10:00 p.m. And follow closely    Paroxysmal atrial fibrillation  -as above, decrease baseline metoprolol dose to 12.5 mg p.o. B.i.d. And await recommendations from Cardiology  -monitor on telemetry  -continue aspirin    Urinary retention/BPH/prostate cancer  -maintain indwelling Foley catheter  -recommend resuming Flomax 0.4 mg p.o. At least daily  -continue follow-up with primary Urology as scheduled    Diabetes mellitus type 2  -continue baseline Janumet  -place on sliding scale insulin coverage per protocol  -diabetic diet    DVT/PE Prophylaxis: Enoxaparin  DNR Status:  Full code    Shelbie Hutching, NP  05/14/2017, 19:13      I personally saw and evaluated the patient  on 05/14/17. See mid-level's note for additional details. My findings/participation are documented in the H&P    Azucena Fallen, DO

## 2017-05-14 NOTE — ED Provider Notes (Signed)
[facility] Emergency Department      Encounter Diagnoses   Name Primary?   . Syncope, unspecified syncope type Yes   . Hyponatremia        ED Course/MDM:    77 year old male per after an episode syncope.  He has been having more frequent episodes of syncope in the past week. Recent increased dosing in his metoprolol for atrial fibrillation.    Discussion was had with attending physician, Dr. Coralee North, whom also saw and evaluated the pt.       Differential diagnosis includes, but is not limited to,  Vasovagal vs orthostatic hypotension vs cardiogenic syncope. Patient is adamant that the syncope has to do with his atrial fibrillation.  On arrival to the emergency department, patient is in sinus rhythm with a normal BP.  Will perform cardiac workup.  During initial interview, patient is adamant that he is admitted at the end of our encounter.     Patient sees multiple specialists at [place], and in [place] so gathering of history from external sources is difficult.    ED Course as of May 17 1323   Sat May 14, 2017   1527 EKG showing sinus rhythm.  No acute ischemic changes.  [MA]   9417 TROPONIN-I: 0.02 [MA]   1652 SODIUM: (!) 123 [MA]   1652 HGB: (!) 10.1 [MA]   1652 HCT: (!) 30.5 [MA]   1652 Chest x-ray showing trace bilateral pleural effusions.  [MA]   [place] contacted for admission for further syncope workup and medication adjustment.   I think the most likely etiology of his symptoms is that his beta-blocker is causing him to syncopize.  Patient refused CT brain.  [MA]      ED Course User Index  [MA] Joni Fears, MD       Patient admitted without further incident in the emergency department.     Medications given during ED stay include:  Medications   aspirin chewable tablet 81 mg (81 mg Oral Given 05/16/17 0837)   tamsulosin (FLOMAX) capsule (0 mg Oral Not Given 05/16/17 0837)   NS flush syringe (3 mL Intracatheter Given 05/16/17 0839)   NS flush syringe (not  administered)   enoxaparin PF (LOVENOX) 40 mg/0.4 mL SubQ injection (0 mg Subcutaneous Not Given 05/15/17 2100)   dextrose 50% (0.5 g/mL) injection - syringe (not administered)   SSIP insulin lispro (HUMALOG) 100 units/mL injection (2 Units Subcutaneous Given 05/16/17 1224)   acetaminophen (TYLENOL) tablet (not administered)   sitaGLIPtin (JANUVIA) tablet (50 mg Oral Given 05/16/17 0838)     And   metFORMIN (GLUCOPHAGE) tablet (500 mg Oral Given 05/16/17 0837)   predniSONE (DELTASONE) tablet (5 mg Oral Given 05/16/17 0837)   NS premix infusion ( Intravenous New Bag/New Syringe 05/15/17 2117)   docusate sodium (COLACE) capsule (100 mg Oral Given 05/16/17 0837)   metoprolol tartrate (LOPRESSOR) tablet (12.5 mg Oral Given 05/30/12 4818)   salicylic acid-sulfur 5%-6% topical shampoo ( Apply Topically Not Given 05/15/17 1700)   ondansetron (ZOFRAN) 2 mg/mL injection (4 mg Intravenous Given 05/16/17 1230)   amiodarone (CORDARONE) tablet (400 mg Oral Given 05/16/17 1224)   predniSONE (DELTASONE) tablet (5 mg Oral Given 05/15/17 0058)   digoxin (LANOXIN) 250 mcg/mL injection (250 mcg Intravenous Given 05/15/17 1445)           Disposition: Admitted        Chief Complaint:  Patient presents with     Chief Complaint  Patient presents with   . Syncope       HPI    Joe Carroll, date of birth 06-19-1940, is a 77 y.o. male with PMHx of a-fib and HTN who presents to the Emergency Department with c/o syncopal episode.  He was reaching for the handle in a "leisure [employer]i", when he lost consciousness and his son, whom was standing behind him, lowered him tot he ground. He did not strike said, no seizure-like activity.   He has had about 4 of these  Episodes all since his metoprolol dosage was increased from 50 mg BID to 75 mg BID 3 weeks ago.    Pt feels a prodrome of "neck weakness" prior to it happening.  He denies chest pain or shortness of breath. He has been seen at [facility] for the same, with a reported negative workup.     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 He has been travelling to [facility] to have his indwelling urinary catheter replaced.     The patient sees multiple specialists in a   Variety of geographic locations including the [employer], [place], and a facility in [place].      ROS:   Constitutional: No fever, chills +Fall +Weakness  Skin: No rash or diaphoresis  HENT: No headaches, or congestion  Eyes: No vision changes or photophobia   Cardio: No chest pain or leg swelling +tachycardia, syncope  Respiratory: No cough, wheezing or SOB  GI:  No nausea, vomiting or stool changes  GU:  No dysuria, hematuria, or increased frequency  MSK: No muscle aches, or back pain +Neck discomfort  Neuro: No seizures, numbness, tingling, or focal weakness +LOC  All other systems reviewed and are negative.    PE:   ED Triage Vitals [05/14/17 1337]   Enc Vitals Group      BP (Non-Invasive) 121/68      Heart Rate 89      Respiratory Rate 16      Temperature 36.7 C (98.1 F)      Temp src       SpO2 100 %      Weight 80.7 kg (178 lb)      Height 1.727 m (5\' 8" )      Head Circumference       Peak Flow       Pain Score       Pain Loc       Pain Edu?       Excl. in [place]?        Nursing notes and vital signs reviewed.    Constitutional: 77 y.o. male appears stated age in poor health, in no acute distress, normal color, no cyanosis.   HENT:   Head: Normocephalic and atraumatic.   Mouth/Throat: Oropharynx is clear and moist.   Eyes: EOMI, PERRL   Neck: Trachea midline. Neck supple.  Cardiovascular: RRR, No murmurs, rubs or gallops. Intact distal pulses.  Pulmonary/Chest: BS equal bilaterally. No respiratory distress. No wheezes, rales or chest tenderness.   Abdominal: BS +. Abdomen soft, no tenderness, rebound or guarding.  Back: No midline spinal tenderness, no paraspinal tenderness, no CVA tenderness.           Musculoskeletal: No edema, tenderness or deformity.  Skin: warm and dry. No rash, erythema, pallor or  cyanosis  Psychiatric: normal mood and affect. Behavior is normal.   Neurological: Patient keenly alert and responsive, CN II-XII grossly intact  Past Medical History:  Diagnosis     Past Medical History:   Diagnosis Date   . Acute renal failure (CMS HCC)    . Atrial fibrillation (CMS HCC)     paroxismal   . BPH (benign prostatic hyperplasia)    . Cancer (CMS [place])    . Diabetes mellitus, type 2 (CMS HCC)    . Essential hypertension    . Prostate CA (CMS [place])    . Prostatitis    . Wears dentures    . Wears glasses        Past Surgical History:  Past Surgical History:   Procedure Laterality Date   . Hx turp     . Knee mass excision         Family History:   Family History   Problem Relation Age of Onset   . Diabetes Mother    . Coronary Artery Disease Mother        Social History     Social History     Tobacco Use   . Smoking status: Former Research scientist (life sciences)   . Smokeless tobacco: Never Used   Substance Use Topics   . Alcohol use: Never     Frequency: Never   . Drug use: Never       Social History     Substance and Sexual Activity   Drug Use Never       Prescilla Sours, MD    Allergies   Allergen Reactions   . Ciprofloxacin Hives/ Urticaria           Diagnostics:    Labs:    Results for orders placed or performed during the hospital encounter of 05/14/17   URINE CULTURE,ROUTINE   Result Value Ref Range    URINE CULTURE No Growth 18-24 hrs.    BASIC METABOLIC PANEL   Result Value Ref Range    SODIUM 123 (L) 135 - 145 mmol/L    POTASSIUM 4.9 3.5 - 5.0 mmol/L    CHLORIDE 91 (L) 98 - 111 mmol/L    CO2 TOTAL 22 21 - 35 mmol/L    ANION GAP 10 mmol/L    CALCIUM 8.7 (L) 8.8 - 10.3 mg/dL    GLUCOSE 155 (H) 70 - 110 mg/dL    BUN 21 10 - 25 mg/dL    CREATININE 0.66 <=1.30 mg/dL    BUN/CREA RATIO 32     ESTIMATED GFR >60 Avg: 75 mL/min/1.68m^2   PTT (PARTIAL THROMBOPLASTIN TIME)   Result Value Ref Range    APTT 27.7 25.0 - 37.0 seconds   PT/INR   Result Value Ref Range    INR 1.18 (H) 0.80 - 1.10   TROPONIN-I   Result Value Ref Range     TROPONIN I 0.02 <=0.04 ng/mL   CBC WITH DIFF   Result Value Ref Range    WBC 4.7 3.3 - 9.3 x10^3/uL    RBC 3.09 (L) 4.40 - 5.68 x10^6/uL    HGB 10.1 (L) 13.4 - 17.3 g/dL    HCT 30.5 (L) 38.9 - 50.5 %    MCV 98.7 (H) 82.4 - 95.0 fL    MCH 32.6 27.9 - 33.1 pg    MCHC 33.0 32.8 - 36.0 g/dL    RDW 21.4 (H) 10.9 - 15.1 %    PLATELETS 231 140 - 440 x10^3/uL    MPV 7.5 6.0 - 10.2 fL    NEUTROPHIL % 73 %    LYMPHOCYTE % 15 %  MONOCYTE % 11 %    EOSINOPHIL % 0 %    BASOPHIL % 0 %    NEUTROPHIL # 3.40 1.60 - 5.50 x10^3/uL    LYMPHOCYTE # 0.70 (L) 0.80 - 3.20 x10^3/uL    MONOCYTE # 0.50 0.20 - 0.80 x10^3/uL    EOSINOPHIL # 0.00 0.00 - 0.50 x10^3/uL    BASOPHIL # 0.00 0.00 - 0.20 x10^3/uL   URINALYSIS, MACRO/MICRO   Result Value Ref Range    COLOR Yellow Yellow, Straw, Colorless    APPEARANCE Cloudy Clear, Cloudy    SPECIFIC GRAVITY 1.016 1.010 - 1.025    PH 6.0 5.0 - 7.0    LEUKOCYTES Small (A) Negative WBCs/uL    NITRITE Negative Negative    PROTEIN Small (A) Negative, Trace mg/dL    GLUCOSE Negative Negative mg/dL    KETONES Trace (A) Negative mg/dL    UROBILINOGEN 1.0 normal, 0.2 , 1.0 mg/dL    BILIRUBIN Small (A) Negative mg/dL    BLOOD Large (A) Negative mg/dL    URINALYSIS COMMENTS STOP     RBCS TNTC (A) None /hpf    WBCS 10-20 (A) None /hpf    BACTERIA None None /hpf    SQUAMOUS EPITHELIAL None None, Rare, Occasional /hpf   B-TYPE NATRIURETIC PEPTIDE   Result Value Ref Range    BNP 68 0 - 100 pg/mL   MANUAL DIFFERENTIAL   Result Value Ref Range    NEUTROPHIL % 73 (H) 50 - 70 %    LYMPHOCYTE % 10 (L) 20 - 40 %    MONOCYTE % 8 2 - 11 %    METAMYELOCYTE % 6 %    MYELOCYTE % 3 %    NEUTROPHIL ABSOLUTE 3.43 x10^3/uL    LYMPHOCYTE ABSOLUTE 0.47 x10^3/uL    MONOCYTE ABSOLUTE 0.38 x10^3/uL    METAMYELOCYTE ABSOLUTE 0.28 x10^3/uL    MYELOCYTE ABSOLUTE 0.14 x10^3/uL    NUCLEATED RBC MANUAL 3.0     MACROCYTOSIS Slight     POLYCHROMASIA Slight     TEARDROP CELLS Occasional     WBC MORPHOLOGY COMMENT Normal     WBC 4.7 x10^3/uL      SODIUM, RANDOM URINE   Result Value Ref Range    SODIUM RANDOM URINE 18 No known normals mmol/L   OSMOLALITY, RANDOM URINE   Result Value Ref Range    OSMOLALITY URINE 610 250 - 900 mOsm/kg   OSMOLALITY   Result Value Ref Range    OSMOLALITY, BLOOD 278 (L) 280 - 300 mOsm/kg   TROPONIN-I   Result Value Ref Range    TROPONIN I 0.02 <=0.04 ng/mL   BASIC METABOLIC PANEL   Result Value Ref Range    SODIUM 124 (L) 135 - 145 mmol/L    POTASSIUM 4.5 3.5 - 5.0 mmol/L    CHLORIDE 92 (L) 98 - 111 mmol/L    CO2 TOTAL 22 21 - 35 mmol/L    ANION GAP 10 mmol/L    CALCIUM 8.4 (L) 8.8 - 10.3 mg/dL    GLUCOSE 149 (H) 70 - 110 mg/dL    BUN 20 10 - 25 mg/dL    CREATININE 0.68 <=1.30 mg/dL    BUN/CREA RATIO 29     ESTIMATED GFR >60 Avg: 75 mL/min/1.68m^2   MAGNESIUM   Result Value Ref Range    MAGNESIUM 1.7 (L) 1.8 - 2.3 mg/dL   PHOSPHORUS   Result Value Ref Range    PHOSPHORUS 3.4 2.5 - 4.5 mg/dL   PT/INR  Result Value Ref Range    INR 1.17 (H) 0.80 - 1.10   CBC WITH DIFF   Result Value Ref Range    WBC 4.1 3.3 - 9.3 x10^3/uL    RBC 2.76 (L) 4.40 - 5.68 x10^6/uL    HGB 8.9 (L) 13.4 - 17.3 g/dL    HCT 27.1 (L) 38.9 - 50.5 %    MCV 98.2 (H) 82.4 - 95.0 fL    MCH 32.4 27.9 - 33.1 pg    MCHC 33.0 32.8 - 36.0 g/dL    RDW 21.0 (H) 10.9 - 15.1 %    PLATELETS 203 140 - 440 x10^3/uL    MPV 7.2 6.0 - 10.2 fL    NEUTROPHIL % 73 %    LYMPHOCYTE % 13 %    MONOCYTE % 13 %    EOSINOPHIL % 0 %    BASOPHIL % 1 %    NEUTROPHIL # 3.00 1.60 - 5.50 x10^3/uL    LYMPHOCYTE # 0.50 (L) 0.80 - 3.20 x10^3/uL    MONOCYTE # 0.50 0.20 - 0.80 x10^3/uL    EOSINOPHIL # 0.00 0.00 - 0.50 x10^3/uL    BASOPHIL # 0.00 0.00 - 0.20 Q76^1/PJ   BASIC METABOLIC PANEL, NON-FASTING   Result Value Ref Range    SODIUM 125 (L) 135 - 145 mmol/L    POTASSIUM 4.6 3.5 - 5.0 mmol/L    CHLORIDE 93 (L) 98 - 111 mmol/L    CO2 TOTAL 22 21 - 35 mmol/L    ANION GAP 10 mmol/L    CALCIUM 8.4 (L) 8.8 - 10.3 mg/dL    GLUCOSE 145 (H) 70 - 110 mg/dL    BUN 18 10 - 25 mg/dL    CREATININE 0.66  <=1.30 mg/dL    BUN/CREA RATIO 27     ESTIMATED GFR >60 Avg: 75 KD/TOI/7.12W^5   BASIC METABOLIC PANEL, NON-FASTING   Result Value Ref Range    SODIUM 126 (L) 135 - 145 mmol/L    POTASSIUM 5.0 3.5 - 5.0 mmol/L    CHLORIDE 93 (L) 98 - 111 mmol/L    CO2 TOTAL 20 (L) 21 - 35 mmol/L    ANION GAP 13 mmol/L    CALCIUM 8.7 (L) 8.8 - 10.3 mg/dL    GLUCOSE 155 (H) 70 - 110 mg/dL    BUN 17 10 - 25 mg/dL    CREATININE 0.74 <=1.30 mg/dL    BUN/CREA RATIO 23     ESTIMATED GFR >60 Avg: 75 mL/min/1.10m^2   TROPONIN-I   Result Value Ref Range    TROPONIN I 0.02 <=0.04 ng/mL   BASIC METABOLIC PANEL, NON-FASTING   Result Value Ref Range    SODIUM 125 (L) 135 - 145 mmol/L    POTASSIUM 4.7 3.5 - 5.0 mmol/L    CHLORIDE 93 (L) 98 - 111 mmol/L    CO2 TOTAL 22 21 - 35 mmol/L    ANION GAP 10 mmol/L    CALCIUM 8.3 (L) 8.8 - 10.3 mg/dL    GLUCOSE 165 (H) 70 - 110 mg/dL    BUN 18 10 - 25 mg/dL    CREATININE 0.70 <=1.30 mg/dL    BUN/CREA RATIO 26     ESTIMATED GFR >60 Avg: 75 mL/min/1.29m^2   MANUAL DIFFERENTIAL   Result Value Ref Range    NEUTROPHIL % 62 50 - 70 %    LYMPHOCYTE % 10 (L) 20 - 40 %    MONOCYTE % 7 2 - 11 %    BAND %  10 (H) 0 - 6 %    METAMYELOCYTE % 5 %    MYELOCYTE % 5 %    BLAST % 1 %    NEUTROPHIL ABSOLUTE 2.95 x10^3/uL    LYMPHOCYTE ABSOLUTE 0.41 x10^3/uL    MONOCYTE ABSOLUTE 0.29 x10^3/uL    METAMYELOCYTE ABSOLUTE 0.21 x10^3/uL    MYELOCYTE ABSOLUTE 0.21 x10^3/uL    BLAST ABSOLUTE 0.04 x10^3/uL    NUCLEATED RBC MANUAL 1.0     PLATELET ESTIMATE Adequate     ANISOCYTOSIS 4+     POLYCHROMASIA 2+     TEARDROP CELLS Rare     DOHLE BODIES Occasional     TOXIC GRANULATION 1+     WBC 4.1 D62^2/WL   BASIC METABOLIC PANEL, NON-FASTING   Result Value Ref Range    SODIUM 126 (L) 135 - 145 mmol/L    POTASSIUM 4.8 3.5 - 5.0 mmol/L    CHLORIDE 94 (L) 98 - 111 mmol/L    CO2 TOTAL 22 21 - 35 mmol/L    ANION GAP 10 mmol/L    CALCIUM 8.5 (L) 8.8 - 10.3 mg/dL    GLUCOSE 126 (H) 70 - 110 mg/dL    BUN 19 10 - 25 mg/dL    CREATININE 0.73 <=1.30  mg/dL    BUN/CREA RATIO 26     ESTIMATED GFR >60 Avg: 75 NL/GXQ/1.19E^1   BASIC METABOLIC PANEL, NON-FASTING   Result Value Ref Range    SODIUM 126 (L) 135 - 145 mmol/L    POTASSIUM 4.6 3.5 - 5.0 mmol/L    CHLORIDE 94 (L) 98 - 111 mmol/L    CO2 TOTAL 22 21 - 35 mmol/L    ANION GAP 10 mmol/L    CALCIUM 8.5 (L) 8.8 - 10.3 mg/dL    GLUCOSE 138 (H) 70 - 110 mg/dL    BUN 19 10 - 25 mg/dL    CREATININE 0.71 <=1.30 mg/dL    BUN/CREA RATIO 27     ESTIMATED GFR >60 Avg: 75 DE/YCX/4.48J^8   BASIC METABOLIC PANEL, NON-FASTING   Result Value Ref Range    SODIUM 128 (L) 135 - 145 mmol/L    POTASSIUM 4.5 3.5 - 5.0 mmol/L    CHLORIDE 95 (L) 98 - 111 mmol/L    CO2 TOTAL 23 21 - 35 mmol/L    ANION GAP 10 mmol/L    CALCIUM 8.7 (L) 8.8 - 10.3 mg/dL    GLUCOSE 150 (H) 70 - 110 mg/dL    BUN 17 10 - 25 mg/dL    CREATININE 0.61 <=1.30 mg/dL    BUN/CREA RATIO 28     ESTIMATED GFR >60 Avg: 75 HU/DJS/9.70Y^6   BASIC METABOLIC PANEL   Result Value Ref Range    SODIUM 127 (L) 135 - 145 mmol/L    POTASSIUM 4.6 3.5 - 5.0 mmol/L    CHLORIDE 95 (L) 98 - 111 mmol/L    CO2 TOTAL 21 21 - 35 mmol/L    ANION GAP 11 mmol/L    CALCIUM 8.8 8.8 - 10.3 mg/dL    GLUCOSE 164 (H) 70 - 110 mg/dL    BUN 17 10 - 25 mg/dL    CREATININE 0.63 <=1.30 mg/dL    BUN/CREA RATIO 27     ESTIMATED GFR >60 Avg: 75 mL/min/1.33m^2   MAGNESIUM   Result Value Ref Range    MAGNESIUM 1.8 1.8 - 2.3 mg/dL   PHOSPHORUS   Result Value Ref Range    PHOSPHORUS 3.8 2.5 - 4.5 mg/dL  CBC WITH DIFF   Result Value Ref Range    WBC 3.8 3.3 - 9.3 x10^3/uL    RBC 2.84 (L) 4.40 - 5.68 x10^6/uL    HGB 9.1 (L) 13.4 - 17.3 g/dL    HCT 28.4 (L) 38.9 - 50.5 %    MCV 99.9 (H) 82.4 - 95.0 fL    MCH 32.1 27.9 - 33.1 pg    MCHC 32.2 (L) 32.8 - 36.0 g/dL    RDW 21.5 (H) 10.9 - 15.1 %    PLATELETS 213 140 - 440 x10^3/uL    MPV 7.3 6.0 - 10.2 fL    NEUTROPHIL % 76 %    LYMPHOCYTE % 10 %    MONOCYTE % 13 %    EOSINOPHIL % 0 %    BASOPHIL % 1 %    NEUTROPHIL # 2.90 1.60 - 5.50 x10^3/uL    LYMPHOCYTE # 0.40  (L) 0.80 - 3.20 x10^3/uL    MONOCYTE # 0.50 0.20 - 0.80 x10^3/uL    EOSINOPHIL # 0.00 0.00 - 0.50 x10^3/uL    BASOPHIL # 0.00 0.00 - 0.20 x10^3/uL   MANUAL DIFFERENTIAL   Result Value Ref Range    NEUTROPHIL % 71 (H) 50 - 70 %    LYMPHOCYTE % 10 (L) 20 - 40 %    MONOCYTE % 12 (H) 2 - 11 %    BAND % 4 0 - 6 %    METAMYELOCYTE % 1 %    MYELOCYTE % 2 %    NEUTROPHIL ABSOLUTE 2.85 x10^3/uL    LYMPHOCYTE ABSOLUTE 0.38 x10^3/uL    MONOCYTE ABSOLUTE 0.46 x10^3/uL    METAMYELOCYTE ABSOLUTE 0.04 x10^3/uL    MYELOCYTE ABSOLUTE 0.08 x10^3/uL    PLATELET ESTIMATE Adequate     ANISOCYTOSIS 3+     WBC MORPHOLOGY COMMENT Normal     WBC 3.8 x10^3/uL   COMPREHENSIVE METABOLIC PANEL, NON-FASTING   Result Value Ref Range    SODIUM 128 (L) 135 - 145 mmol/L    POTASSIUM 4.5 3.5 - 5.0 mmol/L    CHLORIDE 98 98 - 111 mmol/L    CO2 TOTAL 23 21 - 35 mmol/L    ANION GAP 7 mmol/L    BUN 18 10 - 25 mg/dL    CREATININE 0.61 <=1.30 mg/dL    BUN/CREA RATIO 30     ESTIMATED GFR >60 Avg: 75 mL/min/1.16m^2    ALBUMIN 2.8 (L) 3.2 - 4.6 g/dL    CALCIUM 8.6 (L) 8.8 - 10.3 mg/dL    GLUCOSE 142 (H) 70 - 110 mg/dL    ALKALINE PHOSPHATASE 355 (H) 20 - 130 U/L    ALT (SGPT) 86 (H) <=52 U/L    AST (SGOT) 154 (H) <=35 U/L    BILIRUBIN TOTAL 0.9 0.3 - 1.2 mg/dL    PROTEIN TOTAL 5.1 (L) 6.0 - 8.3 g/dL   THYROID STIMULATING HORMONE WITH FREE T4 REFLEX   Result Value Ref Range    TSH 1.805 0.450 - 5.330 uIU/mL   ECG 12 LEAD - ED USE   Result Value Ref Range    Heart Rate 85 BPM    PR Interval 152 ms    QRS Duration 91 ms    QT Interval 371 ms    QTC Calculation 442 ms    Calculated P Axis 40 deg    QRS Axis -7 deg    Calculated T Axis 57 deg    I 40 Axis -11 deg    T 40 Axis -  17 deg    ST Axis 74 deg    EKG Severity - NORMAL ECG -    ECG 12 LEAD - ADULT   Result Value Ref Range    Heart Rate 103 BPM    PR Interval 144 ms    QRS Duration 86 ms    QT Interval 320 ms    QTC Calculation 419 ms    Calculated P Axis 44 deg    QRS Axis -18 deg    Calculated T Axis 50 deg     I 40 Axis -55 deg    T 40 Axis -23 deg    ST Axis 92 deg    EKG Severity - NORMAL ECG -    POC BLOOD GLUCOSE (RESULTS)   Result Value Ref Range    GLUCOSE, POC 146 (H) 70 - 110 mg/dl   POC BLOOD GLUCOSE (RESULTS)   Result Value Ref Range    GLUCOSE, POC 133 (H) 70 - 110 mg/dl   POC BLOOD GLUCOSE (RESULTS)   Result Value Ref Range    GLUCOSE, POC 140 (H) 70 - 110 mg/dl   POC BLOOD GLUCOSE (RESULTS)   Result Value Ref Range    GLUCOSE, POC 151 (H) 70 - 110 mg/dl   POC BLOOD GLUCOSE (RESULTS)   Result Value Ref Range    GLUCOSE, POC 166 (H) 70 - 110 mg/dl   POC BLOOD GLUCOSE (RESULTS)   Result Value Ref Range    GLUCOSE, POC 157 (H) 70 - 110 mg/dl     Labs reviewed and interpreted by me.    Radiology:    Results for orders placed or performed during the hospital encounter of 05/14/17   XR CHEST PA AND LATERAL     Status: None    Narrative    Ingvald D Pokorski    PROCEDURE DESCRIPTION: XR CHEST PA AND LATERAL    PROCEDURE PERFORMED DATE AND TIME: 05/14/2017 3:33 PM    CLINICAL INDICATION: syncope    TECHNIQUE: 2 views / 2 images submitted.    COMPARISON: No prior studies were compared.      FINDINGS: No evidence of pneumonia, pneumothorax. No pulmonary edema. Trace  bilateral pleural effusions. Cardiac mediastinal silhouette is within  normal limits. Right IJ chest port with tip terminating in expected  location of the central SVC.      Impression    Trace bilateral pleural effusions.        Radiologist location ID: [id number]         EKG:  12 lead EKG interpreted by me shows normal EKG, normal sinus rhythm      Orders:  Orders Placed This Encounter   . URINE CULTURE,ROUTINE   . XR CHEST PA AND LATERAL   . CANCELED: CT BRAIN WO IV CONTRAST   . CBC/DIFF   . BASIC METABOLIC PANEL   . PTT (PARTIAL THROMBOPLASTIN TIME)   . PT/INR   . TROPONIN-I   . URINALYSIS WITH REFLEX MICROSCOPIC AND CULTURE IF POSITIVE   . CBC WITH DIFF   . URINALYSIS, MACRO/MICRO   . B-TYPE NATRIURETIC PEPTIDE   . MANUAL DIFFERENTIAL   . CBC/DIFF   .  CANCELED: BASIC METABOLIC PANEL, NON-FASTING   . MAGNESIUM   . PHOSPHORUS   . PT/INR   . SODIUM, RANDOM URINE   . OSMOLALITY, RANDOM URINE   . OSMOLALITY   . TROPONIN-I   . BASIC METABOLIC PANEL   . CANCELED: CORTISOL, SERUM   . CBC  WITH DIFF   . CANCELED: BASIC METABOLIC PANEL, NON-FASTING   . TROPONIN-I   . MANUAL DIFFERENTIAL   . BASIC METABOLIC PANEL, NON-FASTING   . BASIC METABOLIC PANEL   . CBC/DIFF   . MAGNESIUM   . PHOSPHORUS   . CBC WITH DIFF   . MANUAL DIFFERENTIAL   . COMPREHENSIVE METABOLIC PANEL, NON-FASTING   . THYROID STIMULATING HORMONE WITH FREE T4 REFLEX   . OXYGEN - NASAL CANNULA   . ECG 12 LEAD - ED USE   . ECG 12 LEAD - ADULT   . PERFORM POC WHOLE BLOOD GLUCOSE   . CANCELED: EEG AWAKE/DROWSY HYPERVENTILATION/PHOTOSTIMULATION   . INSERT & MAINTAIN PERIPHERAL IV ACCESS   . PATIENT CLASS/LEVEL OF CARE DESIGNATION - [facility]   . aspirin chewable tablet 81 mg   . tamsulosin (FLOMAX) capsule   . NS flush syringe   . NS flush syringe   . enoxaparin PF (LOVENOX) 40 mg/0.4 mL SubQ injection   . dextrose 50% (0.5 g/mL) injection - syringe   . SSIP insulin lispro (HUMALOG) 100 units/mL injection   . acetaminophen (TYLENOL) tablet   . AND Linked Order Group    . sitaGLIPtin (JANUVIA) tablet    . metFORMIN (GLUCOPHAGE) tablet   . predniSONE (DELTASONE) tablet   . predniSONE (DELTASONE) tablet   . NS premix infusion   . docusate sodium (COLACE) capsule   . digoxin (LANOXIN) 250 mcg/mL injection   . metoprolol tartrate (LOPRESSOR) tablet   . salicylic acid-sulfur 1%-3% topical shampoo   . ondansetron (ZOFRAN) 2 mg/mL injection   . amiodarone (CORDARONE) tablet         I am scribing for, and in the presence of, Dr. Jonn Shingles for services provided on 05/14/2017.  Vickii Penna, SCRIBE   Vickii Penna, SCRIBE  05/14/2017, 14:25    I personally performed the services described in this documentation, as scribed  in my presence, and it is both accurate  and complete.       Joni Fears, MD 05/16/2017 14:25  PGY-2,  Department of Emergency [place] [facility]  Pager: [phone]      Parts of this patients chart were completed in a retrospective fashion due to simultaneous direct patient care activities in the Emergency Department. This note was partially generated using MModal Fluency Direct System, and there may be some incorrect words, spellings, and punctuation that were not noted in proofreading the note prior to saving.

## 2017-05-14 NOTE — ED Attending Note (Signed)
I was physically present and directly supervised this patient's care seen on 05/14/2017 Patient was seen and examined by me. The resident, Dr. Langston Masker history and exam were reviewed. Key elements in addition to and/or correction of that documentation are as follows:    Chief Complaint   Patient presents with   . Syncope       History of Present Illness  Joe Carroll is a 77 y.o. male brought by EMS accompanied by wife and son presents with episode of passing out.  Patient reports he was stepping up into "a leisure bus" when he passed out.  Patient's son reports that he caught him and the patient was unresponsive for approximately 60 seconds.  Patient had no loss of bowel or bladder.  No tongue biting.  No seizure activity was iwtnessed.  Patient was not confused after the episode.  According to the patient and his family, this is his fourth episode of passing out this week.  Patient had an episode of passing out while at [facility] Emergency Department triage.  The patient reports that he was at [facility] Emergency Department secondary to a urological issue when he passed out last weekend.  He reports that he had a negative workup and was discharged from the emergency department.  Patient strongly believes that he is having these episodes of passing out secondary to atrial fibrillation.  He reports that when his neck is positioned a certain way he goes into atrial fibrillation and then he "cardioverts"on his own, going back into normal rhythm and symptoms resolve.  Patient reports that he takes metoprolol.    Patient denies any chest pain.  He denies any shortness of breath.  He denies any focal weakness of his arms or legs.  He denies any sensory changes of his arms or legs.  He denies any lightheadedness.  He denies any acute visual changes.  Denies any acute changes in his hearing.      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    Patient reports he is a [employer] patient as well as having physicians in [place] where he resides part of the year.    Past Medical History:  Past Medical History:   Diagnosis Date   . Acute renal failure (CMS HCC)    . BPH (benign prostatic hyperplasia)    . Diabetes mellitus, type 2 (CMS HCC)    . Essential hypertension    . Prostate CA (CMS [place])    . Prostatitis        Past Surgical History:  History reviewed. No pertinent surgical history.    Social History:  Social History     Tobacco Use   . Smoking status: Former Research scientist (life sciences)   . Smokeless tobacco: Never Used   Substance Use Topics   . Alcohol use: Not Currently   . Drug use: Never     Social History     Substance and Sexual Activity   Drug Use Never       Family History:  No family history on file.    Medications Prior to Admission     Prescriptions    amLODIPine (NORVASC) 5 mg Oral Tablet    Take 1 Tab (5 mg total) by mouth Once a day    JANUMET 50-500 mg Oral Tablet    TAKE 1 TABLET BY MOUTH TWICE A DAY    losartan (COZAAR) 100 mg Oral Tablet    Take 1 Tab (100 mg  total) by mouth Once a day    tamsulosin (FLOMAX) 0.4 mg Oral Capsule, Sust. Release 24 hr    TAKE ONE CAPSULE BY MOUTH TWICE A DAY        Above history reviewed with patient.    Physical Examination  Vitals:    05/14/17 1337 05/14/17 1617 05/14/17 1745   BP: 121/68 132/68 (!) 131/59   Pulse: 89 90 93   Resp: 16 17 (!) 22   Temp: 36.7 C (98.1 F)     SpO2: 100% 100% 100%   Weight: 80.7 kg (178 lb)     Height: 1.727 m (5\' 8" )     BMI: 27.12       No acute distress  CN II-X II grossly intact  HENT-PERRL, TMs are intact and clear without injection bilaterally.  No nystagmus bilaterally.  Extraocular movements are intact and non-painful bilaterally  HEART-RRR  LUNGS-CTAB  ABD-soft, non tender, chronic indwelling Foley catheter  EXT-no pedal edema  5/5 motor strength of the upper extremities  5/5 motor strength of lower extremities  No significant focal sensory deficits of the upper extremities  No significant focal sensory  deficits of the lower extremities  SKIN- warm, dry, no signs of acute cellulitis or ischemia    Laboratory Studies  Results for orders placed or performed during the hospital encounter of 62/22/97   BASIC METABOLIC PANEL   Result Value Ref Range    SODIUM 123 (L) 135 - 145 mmol/L    POTASSIUM 4.9 3.5 - 5.0 mmol/L    CHLORIDE 91 (L) 98 - 111 mmol/L    CO2 TOTAL 22 21 - 35 mmol/L    ANION GAP 10 mmol/L    CALCIUM 8.7 (L) 8.8 - 10.3 mg/dL    GLUCOSE 155 (H) 70 - 110 mg/dL    BUN 21 10 - 25 mg/dL    CREATININE 0.66 <=1.30 mg/dL    BUN/CREA RATIO 32     ESTIMATED GFR >60 Avg: 75 mL/min/1.79m^2   PTT (PARTIAL THROMBOPLASTIN TIME)   Result Value Ref Range    APTT 27.7 25.0 - 37.0 seconds   PT/INR   Result Value Ref Range    INR 1.18 (H) 0.80 - 1.10   TROPONIN-I   Result Value Ref Range    TROPONIN I 0.02 <=0.04 ng/mL   CBC WITH DIFF   Result Value Ref Range    WBC 4.7 3.3 - 9.3 x10^3/uL    RBC 3.09 (L) 4.40 - 5.68 x10^6/uL    HGB 10.1 (L) 13.4 - 17.3 g/dL    HCT 30.5 (L) 38.9 - 50.5 %    MCV 98.7 (H) 82.4 - 95.0 fL    MCH 32.6 27.9 - 33.1 pg    MCHC 33.0 32.8 - 36.0 g/dL    RDW 21.4 (H) 10.9 - 15.1 %    PLATELETS 231 140 - 440 x10^3/uL    MPV 7.5 6.0 - 10.2 fL    NEUTROPHIL % 73 %    LYMPHOCYTE % 15 %    MONOCYTE % 11 %    EOSINOPHIL % 0 %    BASOPHIL % 0 %    NEUTROPHIL # 3.40 1.60 - 5.50 x10^3/uL    LYMPHOCYTE # 0.70 (L) 0.80 - 3.20 x10^3/uL    MONOCYTE # 0.50 0.20 - 0.80 x10^3/uL    EOSINOPHIL # 0.00 0.00 - 0.50 x10^3/uL    BASOPHIL # 0.00 0.00 - 0.20 x10^3/uL   URINALYSIS, MACRO/MICRO   Result Value Ref Range  COLOR Yellow Yellow, Straw, Colorless    APPEARANCE Cloudy Clear, Cloudy    SPECIFIC GRAVITY 1.016 1.010 - 1.025    PH 6.0 5.0 - 7.0    LEUKOCYTES Small (A) Negative WBCs/uL    NITRITE Negative Negative    PROTEIN Small (A) Negative, Trace mg/dL    GLUCOSE Negative Negative mg/dL    KETONES Trace (A) Negative mg/dL    UROBILINOGEN 1.0 normal, 0.2 , 1.0 mg/dL    BILIRUBIN Small (A) Negative mg/dL    BLOOD Large  (A) Negative mg/dL    URINALYSIS COMMENTS STOP     RBCS TNTC (A) None /hpf    WBCS 10-20 (A) None /hpf    BACTERIA None None /hpf    SQUAMOUS EPITHELIAL None None, Rare, Occasional /hpf   B-TYPE NATRIURETIC PEPTIDE   Result Value Ref Range    BNP 68 0 - 100 pg/mL   MANUAL DIFFERENTIAL   Result Value Ref Range    NEUTROPHIL % 73 (H) 50 - 70 %    LYMPHOCYTE % 10 (L) 20 - 40 %    MONOCYTE % 8 2 - 11 %    METAMYELOCYTE % 6 %    MYELOCYTE % 3 %    NEUTROPHIL ABSOLUTE 3.43 x10^3/uL    LYMPHOCYTE ABSOLUTE 0.47 x10^3/uL    MONOCYTE ABSOLUTE 0.38 x10^3/uL    METAMYELOCYTE ABSOLUTE 0.28 x10^3/uL    MYELOCYTE ABSOLUTE 0.14 x10^3/uL    NUCLEATED RBC MANUAL 3.0     MACROCYTOSIS Slight     POLYCHROMASIA Slight     TEARDROP CELLS Occasional     WBC MORPHOLOGY COMMENT Normal     WBC 4.7 x10^3/uL   ECG 12 LEAD - ED USE   Result Value Ref Range    Heart Rate 85 BPM    PR Interval 152 ms    QRS Duration 91 ms    QT Interval 371 ms    QTC Calculation 442 ms    Calculated P Axis 40 deg    QRS Axis -7 deg    Calculated T Axis 57 deg    I 40 Axis -11 deg    T 40 Axis -17 deg    ST Axis 74 deg    EKG Severity - NORMAL ECG -        Imaging Studies  XR CHEST PA AND LATERAL   Final Result   Trace bilateral pleural effusions.            Radiologist location ID: [id number]           ECG:  Please refer to trace master for reading.  No previous ECGs in system for comparison.  Sinus rhythm, rate 85. No significant acute ST elevation.    Previous medical records reviewed.    All labs, imaging studies and UA reviewed.    Medical intervention during emergency department stay:  Medications - No data to display    Medical Decision Making/ED Course  Patient underwent-ECG, blood work, urinalysis, chest x-ray  Repeat examination-unchanged  Results reviewed.   All results were discussed with patient and patient's family. Patient denies any history hyponatremia.   Patient voiced understanding and comfort with plan of care.  Patient capable of medical  decision making.    Consults  On call hospitalist-Dr. Kandice Robinsons    Impressions   Encounter Diagnoses   Name Primary?   . Syncope, unspecified syncope type Yes   . Hyponatremia         Disposition  Admission  [place], DO 05/14/2017, 17:58  Emergency Medicine Attending    This chart may have been completed after the conclusion of this patient's care due to the time constraints of simultaneous responsiltibites of direct patient care activities during the clinical shift in the emergency department.   This note was partially generated using MModal Fluency Direct system, and there may be some incorrect words, spellings, and punctuation that were not noted in checking the note before saving.

## 2017-05-15 ENCOUNTER — Encounter (HOSPITAL_COMMUNITY): Payer: Self-pay

## 2017-05-15 DIAGNOSIS — R55 Syncope and collapse: Secondary | ICD-10-CM

## 2017-05-15 DIAGNOSIS — I480 Paroxysmal atrial fibrillation: Secondary | ICD-10-CM

## 2017-05-15 DIAGNOSIS — Z7901 Long term (current) use of anticoagulants: Secondary | ICD-10-CM

## 2017-05-15 DIAGNOSIS — E871 Hypo-osmolality and hyponatremia: Secondary | ICD-10-CM

## 2017-05-15 DIAGNOSIS — C61 Malignant neoplasm of prostate: Secondary | ICD-10-CM

## 2017-05-15 DIAGNOSIS — Z87891 Personal history of nicotine dependence: Secondary | ICD-10-CM

## 2017-05-15 DIAGNOSIS — E119 Type 2 diabetes mellitus without complications: Secondary | ICD-10-CM

## 2017-05-15 DIAGNOSIS — R339 Retention of urine, unspecified: Secondary | ICD-10-CM

## 2017-05-15 DIAGNOSIS — Z7984 Long term (current) use of oral hypoglycemic drugs: Secondary | ICD-10-CM

## 2017-05-15 DIAGNOSIS — N401 Enlarged prostate with lower urinary tract symptoms: Secondary | ICD-10-CM

## 2017-05-15 LAB — POC BLOOD GLUCOSE (RESULTS)
GLUCOSE, POC: 146 mg/dl — ABNORMAL HIGH (ref 70–110)
GLUCOSE, POC: 133 mg/dl — ABNORMAL HIGH (ref 70–110)
GLUCOSE, POC: 140 mg/dl — ABNORMAL HIGH (ref 70–110)
GLUCOSE, POC: 151 mg/dl — ABNORMAL HIGH (ref 70–110)

## 2017-05-15 LAB — CBC WITH DIFF
BASOPHIL #: 0 x10ˆ3/uL (ref 0.00–0.20)
BASOPHIL %: 1 %
EOSINOPHIL #: 0 x10ˆ3/uL (ref 0.00–0.50)
EOSINOPHIL %: 0 %
HCT: 27.1 % — ABNORMAL LOW (ref 38.9–50.5)
HGB: 8.9 g/dL — ABNORMAL LOW (ref 13.4–17.3)
LYMPHOCYTE #: 0.5 x10ˆ3/uL — ABNORMAL LOW (ref 0.80–3.20)
LYMPHOCYTE %: 13 %
MCH: 32.4 pg (ref 27.9–33.1)
MCHC: 33 g/dL (ref 32.8–36.0)
MCV: 98.2 fL — ABNORMAL HIGH (ref 82.4–95.0)
MONOCYTE #: 0.5 x10ˆ3/uL (ref 0.20–0.80)
MONOCYTE %: 13 %
MPV: 7.2 fL (ref 6.0–10.2)
NEUTROPHIL #: 3 x10ˆ3/uL (ref 1.60–5.50)
NEUTROPHIL %: 73 %
PLATELETS: 203 x10ˆ3/uL (ref 140–440)
RBC: 2.76 x10ˆ6/uL — ABNORMAL LOW (ref 4.40–5.68)
RDW: 21 % — ABNORMAL HIGH (ref 10.9–15.1)
WBC: 4.1 x10ˆ3/uL (ref 3.3–9.3)

## 2017-05-15 LAB — BASIC METABOLIC PANEL
ANION GAP: 10 mmol/L
ANION GAP: 10 mmol/L
ANION GAP: 13 mmol/L
ANION GAP: 10 mmol/L
ANION GAP: 10 mmol/L
ANION GAP: 10 mmol/L
ANION GAP: 10 mmol/L
BUN/CREA RATIO: 29
BUN/CREA RATIO: 27
BUN/CREA RATIO: 23
BUN/CREA RATIO: 26
BUN/CREA RATIO: 26
BUN/CREA RATIO: 27
BUN: 20 mg/dL (ref 10–25)
BUN: 18 mg/dL (ref 10–25)
BUN: 18 mg/dL (ref 10–25)
BUN: 17 mg/dL (ref 10–25)
BUN: 18 mg/dL (ref 10–25)
BUN: 19 mg/dL (ref 10–25)
BUN: 19 mg/dL (ref 10–25)
CALCIUM: 8.4 mg/dL — ABNORMAL LOW (ref 8.8–10.3)
CALCIUM: 8.4 mg/dL — ABNORMAL LOW (ref 8.8–10.3)
CALCIUM: 8.7 mg/dL — ABNORMAL LOW (ref 8.8–10.3)
CALCIUM: 8.3 mg/dL — ABNORMAL LOW (ref 8.8–10.3)
CALCIUM: 8.5 mg/dL — ABNORMAL LOW (ref 8.8–10.3)
CALCIUM: 8.5 mg/dL — ABNORMAL LOW (ref 8.8–10.3)
CHLORIDE: 92 mmol/L — ABNORMAL LOW (ref 98–111)
CHLORIDE: 93 mmol/L — ABNORMAL LOW (ref 98–111)
CHLORIDE: 93 mmol/L — ABNORMAL LOW (ref 98–111)
CHLORIDE: 93 mmol/L — ABNORMAL LOW (ref 98–111)
CHLORIDE: 93 mmol/L — ABNORMAL LOW (ref 98–111)
CHLORIDE: 94 mmol/L — ABNORMAL LOW (ref 98–111)
CHLORIDE: 94 mmol/L — ABNORMAL LOW (ref 98–111)
CO2 TOTAL: 22 mmol/L (ref 21–35)
CO2 TOTAL: 22 mmol/L (ref 21–35)
CO2 TOTAL: 20 mmol/L — ABNORMAL LOW (ref 21–35)
CO2 TOTAL: 22 mmol/L (ref 21–35)
CO2 TOTAL: 22 mmol/L (ref 21–35)
CO2 TOTAL: 22 mmol/L (ref 21–35)
CREATININE: 0.68 mg/dL (ref <=1.30)
CREATININE: 0.66 mg/dL (ref <=1.30)
CREATININE: 0.74 mg/dL (ref <=1.30)
CREATININE: 0.7 mg/dL (ref <=1.30)
CREATININE: 0.73 mg/dL (ref <=1.30)
CREATININE: 0.71 mg/dL (ref <=1.30)
ESTIMATED GFR: >60 mL/min/1.73m?2
ESTIMATED GFR: >60 mL/min/{1.73_m2}
ESTIMATED GFR: >60 mL/min/1.73mˆ2
ESTIMATED GFR: >60 mL/min/1.73mˆ2
ESTIMATED GFR: >60 mL/min/{1.73_m2}
ESTIMATED GFR: >60 mL/min/1.73mˆ2
ESTIMATED GFR: >60 mL/min/1.73mˆ2
ESTIMATED GFR: >60 mL/min/1.73mˆ2
GLUCOSE: 149 mg/dL — ABNORMAL HIGH (ref 70–110)
GLUCOSE: 145 mg/dL — ABNORMAL HIGH (ref 70–110)
GLUCOSE: 155 mg/dL — ABNORMAL HIGH (ref 70–110)
GLUCOSE: 165 mg/dL — ABNORMAL HIGH (ref 70–110)
GLUCOSE: 126 mg/dL — ABNORMAL HIGH (ref 70–110)
GLUCOSE: 138 mg/dL — ABNORMAL HIGH (ref 70–110)
POTASSIUM: 4.5 mmol/L (ref 3.5–5.0)
POTASSIUM: 4.6 mmol/L (ref 3.5–5.0)
POTASSIUM: 5 mmol/L (ref 3.5–5.0)
POTASSIUM: 4.7 mmol/L (ref 3.5–5.0)
POTASSIUM: 4.8 mmol/L (ref 3.5–5.0)
POTASSIUM: 4.6 mmol/L (ref 3.5–5.0)
SODIUM: 124 mmol/L — ABNORMAL LOW (ref 135–145)
SODIUM: 125 mmol/L — ABNORMAL LOW (ref 135–145)
SODIUM: 126 mmol/L — ABNORMAL LOW (ref 135–145)
SODIUM: 126 mmol/L — ABNORMAL LOW (ref 135–145)
SODIUM: 125 mmol/L — ABNORMAL LOW (ref 135–145)
SODIUM: 126 mmol/L — ABNORMAL LOW (ref 135–145)
SODIUM: 126 mmol/L — ABNORMAL LOW (ref 135–145)

## 2017-05-15 LAB — ECG 12 LEAD - ED USE
Calculated P Axis: 40 deg
Calculated T Axis: 57 deg
EKG Severity: NORMAL
EKG Severity: NORMAL
Heart Rate: 85 {beats}/min
I 40 Axis: -11 deg
PR Interval: 152 ms
QRS Axis: -7 deg
QRS Duration: 91 ms
QT Interval: 371 ms
QTC Calculation: 442 ms
ST Axis: 74 deg
T 40 Axis: -17 deg

## 2017-05-15 LAB — MANUAL DIFFERENTIAL
BAND %: 10 % — ABNORMAL HIGH (ref 0–6)
BLAST %: 1 %
BLAST ABSOLUTE: 0.04 x10ˆ3/uL
LYMPHOCYTE %: 10 % — ABNORMAL LOW (ref 20–40)
LYMPHOCYTE ABSOLUTE: 0.41 x10ˆ3/uL
METAMYELOCYTE %: 5 %
METAMYELOCYTE ABSOLUTE: 0.21 x10ˆ3/uL
MONOCYTE %: 7 % (ref 2–11)
MONOCYTE %: 7 % (ref 2–11)
MONOCYTE ABSOLUTE: 0.29 x10ˆ3/uL
MYELOCYTE %: 5 %
MYELOCYTE ABSOLUTE: 0.21 x10ˆ3/uL
NEUTROPHIL %: 62 % (ref 50–70)
NEUTROPHIL ABSOLUTE: 2.95 x10?3/uL
NEUTROPHIL ABSOLUTE: 2.95 x10ˆ3/uL
NUCLEATED RBC MANUAL: 1
PLATELET ESTIMATE: ADEQUATE
WBC: 4.1 x10ˆ3/uL

## 2017-05-15 LAB — TROPONIN-I
TROPONIN I: 0.02 ng/mL (ref <=0.04)
TROPONIN I: 0.02 ng/mL (ref <=0.04)

## 2017-05-15 LAB — PHOSPHORUS
PHOSPHORUS: 3.4 mg/dL (ref 2.5–4.5)
PHOSPHORUS: 3.4 mg/dL (ref 2.5–4.5)

## 2017-05-15 LAB — PT/INR: INR: 1.17 — ABNORMAL HIGH (ref 0.80–1.10)

## 2017-05-15 LAB — MAGNESIUM: MAGNESIUM: 1.7 mg/dL — ABNORMAL LOW (ref 1.8–2.3)

## 2017-05-15 MED ORDER — SODIUM CHLORIDE 0.9 % INTRAVENOUS SOLUTION
INTRAVENOUS | Status: DC
Start: 2017-05-15 — End: 2017-05-19

## 2017-05-15 MED ORDER — DIGOXIN 250 MCG/ML (0.25 MG/ML) INJECTION SOLUTION
250.0000 ug | INTRAMUSCULAR | Status: AC
Start: 2017-05-15 — End: 2017-05-15
  Administered 2017-05-15: 250 ug via INTRAVENOUS
  Filled 2017-05-15: qty 2

## 2017-05-15 MED ORDER — METOPROLOL TARTRATE 25 MG TABLET
12.50 mg | ORAL_TABLET | Freq: Two times a day (BID) | ORAL | Status: DC
Start: 2017-05-15 — End: 2017-05-20
  Administered 2017-05-15 – 2017-05-20 (×9): 12.5 mg via ORAL
  Filled 2017-05-15 (×15): qty 0.5

## 2017-05-15 MED ORDER — SALICYLIC ACID-SULFUR 2 %-2 % SHAMPOO
MEDICATED_SHAMPOO | Freq: Once | CUTANEOUS | Status: DC
Start: 2017-05-15 — End: 2017-05-20
  Administered 2017-05-15: 0 via TOPICAL
  Filled 2017-05-15: qty 118

## 2017-05-15 MED ORDER — DOCUSATE SODIUM 100 MG CAPSULE
100.00 mg | ORAL_CAPSULE | Freq: Two times a day (BID) | ORAL | Status: DC
Start: 2017-05-15 — End: 2017-05-20
  Administered 2017-05-15: 0 mg via ORAL
  Administered 2017-05-15 – 2017-05-16 (×2): 100 mg via ORAL
  Administered 2017-05-16: 0 mg via ORAL
  Administered 2017-05-17 – 2017-05-20 (×6): 100 mg via ORAL
  Filled 2017-05-15 (×15): qty 1

## 2017-05-15 MED ORDER — PREDNISONE 5 MG TABLET
5.00 mg | ORAL_TABLET | ORAL | Status: AC
Start: 2017-05-15 — End: 2017-05-15
  Administered 2017-05-15 (×2): 5 mg via ORAL
  Filled 2017-05-15: qty 1

## 2017-05-15 MED ORDER — ONDANSETRON HCL (PF) 4 MG/2 ML INJECTION SOLUTION
4.00 mg | Freq: Three times a day (TID) | INTRAMUSCULAR | Status: DC | PRN
Start: 2017-05-15 — End: 2017-05-20
  Administered 2017-05-16 – 2017-05-17 (×2): 4 mg via INTRAVENOUS
  Filled 2017-05-15 (×3): qty 2

## 2017-05-15 MED ADMIN — losartan 50 mg tablet: INTRAVENOUS | @ 21:00:00

## 2017-05-15 MED ADMIN — enoxaparin 30 mg/0.3 mL subcutaneous syringe: ORAL | @ 08:00:00

## 2017-05-15 NOTE — Nurses Notes (Signed)
This patient currently meets requirements for low to mid level nursing care. The patient will continue to be monitored and re-evaluated for any changes in condition. Gerron Guidotti, RN

## 2017-05-15 NOTE — Consults (Signed)
PATIENT NAME: Joe Carroll NUMBER:  [id number]  DATE OF SERVICE: 05/15/2017  DATE OF BIRTH:  18-Jan-1941    CONSULTATION    REQUESTING PHYSICIAN:  Azucena Fallen, DO    REASON FOR CONSULT:  Syncope, atrial fibrillation.    HISTORY OF PRESENT ILLNESS:  This is a 77 year old white male with a history of paroxysmal atrial fibrillation and also recurrent syncopal episodes, thought secondary to drop in blood pressure.  The patient has prostate cancer.  The patient is also DNR.  The patient went to [facility], had extensive workup for prostate cancer.  Also he was told he has atrial fibrillation.  They put him on blood thinner.  He stopped this on his own because of bleeding.  The patient was seen at [facility] recently and they did increase metoprolol from 50-75 b.i.d.  The patient yesterday while he was walking, he got dizzy and he passed out.  He has dizzy spells mostly when he does stand.  When I saw him, he was alert and awake. The whole family was at the bedside.  The patient does have chronic prostate problem.  He was told to stop Flomax.    PAST MEDICAL HISTORY:  1. History of atrial fibrillation.    2. Diabetes.  3. Prostate cancer.    PAST SURGERY HISTORY:  History of knee mass excision.    HOME MEDICATIONS:  1. Aspirin.  2. Janumet.  3. Metoprolol.  4. Flomax.    ALLERGIES:  CIPRO.    SOCIAL HISTORY:  He used to smoke in the past.    FAMILY HISTORY:  No history of CAD.    REVIEW OF SYSTEM:  HEENT showed no hearing problem.  Lungs:  History of COPD.  Cardiovascular:  History of atrial fibrillation.  No MI.  GI:  History of acid reflux disease.  CNS:  History of recurrent dizzy spells.  Vascular:  No history of any carotid disease, abdominal aneurysm.  Urology:  History of prostate cancer.  Musculoskeletal:  History of arthritis.    PHYSICAL EXAMINATION:  General:  Elderly white male, alert, awake, oriented x3.  Not in significant distress.  Vital signs:  Heart rate 130-140, blood  pressure 140/80, respirations 20.  HEENT showed no JVD.  Lungs:  Good air entry.  No obvious crackles.  Cardiovascular:  Heart sounds are irregular.  He has a systolic murmur at the apex.  Abdomen:  Soft, nontender.  Extremities:  No edema.  CNS:  No obvious focal deficits.    LABORATORY AND X-RAY DATA:  White count 4100, hemoglobin 8.9, platelet count 203,000, potassium 4.7, creatinine 0.7.  Troponin 0.02.    IMPRESSION:  1. Syncope.   history of drop in blood pressure, autonomic nervous dysfunction.  2. Atrial fibrillation.  Rate is rapid.   3. History of prostate cancer.  4. History of anemia.    PLAN:  The patient refused anticoagulation.  He stopped on his own.  The patient does not want to take too much medication, which will help to control the heart rate, but he thinks did drop blood pressure.  I told him if he has issues with rapid heartbeat and blood pressure is , he should consider AV nodal ablation and pacemaker.  He did not want to jump into that.  We will try digoxin.  We will give metoprolol.  The patient is a DNR.  He does not want anticoagulation.  He did understand the risks of stroke.  Con Memos, MD                DD:  05/15/2017 12:24:44  DT:  05/15/2017 14:55:40 ES  D#:  [id number]

## 2017-05-15 NOTE — Care Plan (Signed)
Problem: Adult Inpatient Plan of Care  Goal: Plan of Care Review  Outcome: Ongoing (see interventions/notes)  Goal: Patient-Specific Goal (Individualization)  Outcome: Ongoing (see interventions/notes)  Goal: Absence of Hospital-Acquired Illness or Injury  Outcome: Ongoing (see interventions/notes)  Goal: Optimal Comfort and Wellbeing  Outcome: Ongoing (see interventions/notes)  Goal: Rounds/Family Conference  Outcome: Ongoing (see interventions/notes)     Problem: Syncope  Goal: Absence of Syncopal Symptoms  Outcome: Ongoing (see interventions/notes)     Problem: Fall Injury Risk  Goal: Absence of Fall and Fall-Related Injury  Outcome: Ongoing (see interventions/notes)     Problem: Pain Acute  Goal: Optimal Pain Control  Outcome: Ongoing (see interventions/notes)

## 2017-05-15 NOTE — Consults (Signed)
Syncopal episodes secondary to drop in blood pressure all this episodes happened while standing suggestive of diabetic autonomic dysfunction    Atrial fibrillation patient refused anticoagulation he was seen at [facility] this cine home anticoagulation he stopped on his own because of bleeding    Metastatic prostate cancer  Plan as per urology doctor they do go to [place] spoke to the patient and family at length for a long time I told if he have trouble controlling the heart rate and can give medication to start on he may have to consider AV nodal ablation and a pacemaker he does not want at this time.  He does not want anticoagulation he do understand risk of stroke.  Will give digoxin and add metoprolol watch his blood pressure in heart rate

## 2017-05-15 NOTE — Progress Notes (Signed)
[facility]  HOSPITALIST PROGRESS NOTE      Joe Carroll, Joe Carroll, 77 y.o. male  Date of Admission:  05/14/2017  Date of service: 05/15/2017  Date of Birth:  1941-01-04    Assessment/Plan:    Syncope  -still having symptoms   -likely orthostatic hypotension, orthostatics grossly positive on admission   -metoprolol 12.5 mg p.o. B.i.d.  -monitor on telemetry  -consult Cardiology and neurology   -high fall risk precautions    Hyponatremia  -slowly improving with saline  -continue to monitor Q6H   -continue NS    Paroxysmal atrial fibrillation  -HR has been elevated all day into 150's  -restart metoprolol 12.5 mg p.o. B.i.d.  -IV digoxin 250 mcg once per cardiology   -monitor on telemetry   -continue aspirin    Urinary retention/BPH/prostate cancer  -maintain indwelling Foley catheter  -recommend resuming Flomax 0.4 mg p.o. At least daily  -continue follow-up with primary Urology as scheduled    Diabetes mellitus type 2  -continue baseline Janumet  -place on sliding scale insulin coverage per protocol  -diabetic diet      Subjective   Having some dizziness and lightheadedness today when moving around. Has no passed out. No chest pain. No shortness of breath. Has been in Afib with RVR today most of the day but he hasn't noticed it.       Current Medications:    Current Facility-Administered Medications:  acetaminophen (TYLENOL) tablet 650 mg Oral Q6H PRN   aspirin chewable tablet 81 mg 81 mg Oral Daily   dextrose 50% (0.5 g/mL) injection - syringe 25 g Intravenous Q15 Min PRN   digoxin (LANOXIN) 250 mcg/mL injection 250 mcg Intravenous Now   docusate sodium (COLACE) capsule 100 mg Oral 2x/day   enoxaparin PF (LOVENOX) 40 mg/0.4 mL SubQ injection 40 mg Subcutaneous Q24H   sitaGLIPtin (JANUVIA) tablet 50 mg Oral 2x/day-Food   And      metFORMIN (GLUCOPHAGE) tablet 500 mg Oral 2x/day-Food   NS flush syringe 3 mL Intracatheter Q8HRS   NS flush syringe 3 mL Intracatheter Q1H PRN   NS premix infusion  Intravenous  Continuous   predniSONE (DELTASONE) tablet 5 mg Oral 2x/day-Food   SSIP insulin lispro (HUMALOG) 100 units/mL injection 1-12 Units Subcutaneous 4x/day AC   tamsulosin (FLOMAX) capsule 0.4 mg Oral Daily       Objective    Vital Signs:  Temperature: 36.8 C (98.2 F)  Heart Rate: (!) 110  BP (Non-Invasive): (!) 146/88  Respiratory Rate: 18  SpO2: 98 %  Pain Score (Numeric, Faces): 0  Liter Flow (L/Min):       Intake & Output      Intake/Output Summary (Last 24 hours) at 05/15/2017 1304  Last data filed at 05/15/2017 0800  Gross per 24 hour   Intake 720 ml   Output 750 ml   Net -30 ml       Today's Physical Exam  Constitutional:  No distress and alert and oriented.  Eyes:  Conjunctiva clear, Pupils equal and round.   Neck:  Supple, symmetrical, trachea midline.  Respiratory:  Clear to auscultation bilaterally, no wheezes.  Cardiovascular:  Irregularly irregular, no murmurs.  Gastrointestinal:  Soft, non-tender, Bowel sounds normal, non-distended.  Musculoskeletal:  Moves all 4 limbs  Integumentary:  Skin warm and dry, No rashes or lesions.  Neurologic:  CN II - XII grossly intact, No tremor.   Extremities: No BLE edema       Labs   CBC  (  Last 48 hours)    Date/Time WBC HGB HCT MCV PLATELETS    05/15/17 0405 4.1    8.9 (L)    27.1 (L)    98.2 (H)    203       05/15/17 0405 4.1    -- -- -- --    05/14/17 1523 4.7    10.1 (L)    30.5 (L)    98.7 (H)    231       05/14/17 1523 4.7    -- -- -- --            BMP  (Last 48 hours)    Date/Time Na K Cl CO2 BUN CREAT Calcium Glucose    05/15/17 1221 126 (L)    4.8    94 (L)    22    19    0.73    8.5 (L)    126 (H)       05/15/17 1012 125 (L)    4.7    93 (L)    22    18    0.70    8.3 (L)    165 (H)       05/15/17 0623 126 (L)    5.0    93 (L)    20 (L)    17    0.74    8.7 (L)    155 (H)       05/15/17 0405 125 (L)    4.6    93 (L)    22    18    0.66    8.4 (L)    145 (H)       05/14/17 2323 124 (L)    4.5    92 (L)    22    20    0.68    8.4 (L)    149 (H)       05/14/17 1523  123 (L)    4.9    91 (L)    22    21    0.66    8.7 (L)    155 (H)           Evynn Boutelle L. [place]iple[place], [place]   05/15/2017 13:04

## 2017-05-15 NOTE — Care Plan (Signed)
END OF SHIFT NOTE: patient was a new admit to [place] with complaints of syncope. Orthostatic vitals x 1 was ordered. Patient refused to stand. Ramon Dredge was notified. Nurse educated patient on the importance of these vitals. Explained how three staff members would keep patient safe during the procedure. Nurse and PCA held patient in a standing position beside bed while a third staff member stood in front of patient and took the vital signs. 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 He also refused Lovenox for the same reason. Minette Brine was notified and prednisone was ordered. Patient has lab work ordered every two hours due to hyponatremia. Will continue to monitor. Diamond Nickel, RN

## 2017-05-15 NOTE — Consults (Signed)
A/P:  Syncope due to hypotension and Afib  Patient didn't have a sz or a TIA.  All sxs all related to Afib.  On maximal medical Mx.      HPI:   Joe Carroll is a 77 y.o., White male with a past medical history of paroxysmal atrial fibrillation diagnosed 3-4 months ago who presents with complaints of recurrent syncope.  There was no tongue biting or bladder incontinence. The patient reports position changes such as standing and ambulating are directly related to his episodes.  He does believe that his heart rate is significantly elevated and his blood pressure is low during these episodes.      He recently was at the [facility] clinic and brain image showed no evidence and any neurological disease.      The patient reports he is unable to walk more than 10 or 20 feet at any given time without suffering a "blackout".  He has been seen by electrophysiologist in [place] who recommended discontinuing Flomax to avoid any further hypotension, as a result the patient has suffered urinary retention and has had to have a Foley catheter inserted last week at [facility].      No sz or stroke like activity.             Past Medical History:   Diagnosis Date   . Acute renal failure (CMS HCC)    . Atrial fibrillation (CMS HCC)     paroxismal   . BPH (benign prostatic hyperplasia)    . Diabetes mellitus, type 2 (CMS HCC)    . Essential hypertension    . Prostate CA (CMS [place])    . Prostatitis                  Past Surgical History:   Procedure Laterality Date   . HX TURP     . KNEE MASS EXCISION             Medications Prior to Admission     Prescriptions    aspirin 81 mg Oral Tablet, Chewable    Take 81 mg by mouth Once a day    JANUMET 50-500 mg Oral Tablet    TAKE 1 TABLET BY MOUTH TWICE A DAY    metoprolol tartrate (LOPRESSOR) 50 mg Oral Tablet    Take 50 mg by mouth Twice daily    tamsulosin (FLOMAX) 0.4 mg Oral Capsule, Sust. Release 24 hr    TAKE ONE CAPSULE BY MOUTH TWICE A DAY                Allergies   Allergen Reactions   . Ciprofloxacin        Social History          Tobacco Use   . Smoking status: Former Research scientist (life sciences)   . Smokeless tobacco: Never Used   Substance Use Topics   . Alcohol use: Not Currently       Family History:        Family Medical History:        Problem Relation (Age of Onset)    Coronary Artery Disease Mother    Diabetes Mother                 ROS:   Constitutional:  No fevers or chills.  Weakness associated with recurrent syncopal episodes and decreased level of activity  Skin: No rash or diaphoresis  HENT: No headaches or congestion  Eyes: No vision changes   Cardio:  No chest pain, palpitations or leg swelling   Respiratory: No cough, wheezing or SOB  GI:  No nausea, vomiting or stool changes  GU:  Urinary retention requiring Foley catheter secondary to BPH/prostate cancer, scheduled to see Urology in [place] on April 1st.  Has chronic microscopic hematuria  MSK: No joint or back pain  Neuro:  Syncopal episode today without acute injury, recurrent episodes of syncope over the last 3-4 months, see HPI  Psychiatric: No depression, SI or substance abuse  All other systems reviewed and are negative.    EXAM:  Vitals: BP (!) 131/59   Pulse 93   Temp 36.7 C (98.1 F)   Resp (!) 22   Ht 1.727 m (5\' 8" )   Wt 80.7 kg (178 lb)   SpO2 100%   BMI 27.06 kg/m       Nursing note and vitals reviewed.   Constitutional: Patient is in no acute distress.    HEENT: Head normocephalic and atraumatic.   Eyes: PERRL, conjunctiva is without erythema or discharge.  Neck: Supple.  Lungs: Clear to auscultation bilaterally without wheezes, rales or rhonchi.  Cardiovascular: Regular rate and rhythm.  Currently in sinus rhythm on telemetry. No murmur, rub, or gallop. Pulses strong and equal bilaterally.  No peripheral edema.      Objective     Vital Signs:  Temp (24hrs) Max:37 C (69.6 F)      Systolic (78LFY), BOF:751 , Min:98 , WCH:852     Diastolic (77OEU), MPN:36, Min:59,  Max:88    Temp  Avg: 36.9 C (98.4 F)  Min: 36.8 C (98.2 F)  Max: 37 C (98.6 F)  MAP (Non-Invasive)  Avg: 81 mmHG  Min: 81 mmHG  Max: 81 mmHG  Pulse  Avg: 110.2  Min: 93  Max: 134  Resp  Avg: 18.8  Min: 17  Max: 22  SpO2  Avg: 99.2 %  Min: 98 %  Max: 100 %  Pain Score (Numeric, Faces): 0    Today's Physical Exam:  Today's Physical Exam:  General: In non acute distress  Mental status: AAOx3  Language and Speech: No dysarthria or aphasia.  Cranial nerves: EOMI, PERRLA, Face symmetric, no nystagmus, tongue midline, normal hearing, normal facial sensation.  Muscle tone: normal tone.  Motor strength: 5/5 throughout.  Sensory: vibration, proprioception, pinprick and temperature are all normal.   Gait: normal gait, able.  Coordination: normal finger to nose and rapid alternating hand movements.  Reflexes: 2+ throughout.      Labs:              CBC  (Last 24 hours)    Date/Time WBC HGB HCT MCV PLATELETS    05/14/17 1523 4.7    10.1 (L)    30.5 (L)    98.7 (H)    231       05/14/17 1523 4.7    -- -- -- --                                      WBC/Diff  (Last 24 hours)    Date/Time WBC Band PMN Lymp Mono Eos Baso Prom Myelo Meta Blasts    05/14/17 1523 4.7    -- 73    15    11    0    0    -- -- -- --    05/14/17 1523 4.7    --  73 (H)    10 (L)    8    -- -- -- 3    6    --                       BMP  (Last 24 hours)    Date/Time Na K Cl CO2 BUN CREAT Calcium Glucose    05/14/17 1523 123 (L)    4.9    91 (L)    22    21    0.66    8.7 (L)    155 (H)             Micro: No results found for any visits on 05/14/17 (from the past 96 hour(s)).    Radiology:        Results for orders placed or performed during the hospital encounter of 05/14/17 (from the past 24 hour(s))   XR CHEST PA AND LATERAL     Status: None    Narrative    Dodger D Vanriper    PROCEDURE DESCRIPTION: XR CHEST PA AND LATERAL    PROCEDURE PERFORMED DATE AND TIME: 05/14/2017 3:33 PM    CLINICAL INDICATION: syncope    TECHNIQUE: 2 views / 2  images submitted.    COMPARISON: No prior studies were compared.      FINDINGS: No evidence of pneumonia, pneumothorax. No pulmonary edema. Trace  bilateral pleural effusions. Cardiac mediastinal silhouette is within  normal limits. Right IJ chest port with tip terminating in expected  location of the central SVC.      Impression    Trace bilateral pleural effusions.

## 2017-05-15 NOTE — Nurses Notes (Signed)
Pt is very argumentative regarding the meds he is receiving and does not remember which ones he has taken right when he is taking them. Repeatedly showed him the computer screen revealing the meds he has just taken and he will question multiple times. Wife is at bedside and does not attempt to intervene in the conversations.

## 2017-05-16 LAB — BASIC METABOLIC PANEL
ANION GAP: 10 mmol/L
ANION GAP: 11 mmol/L
BUN/CREA RATIO: 28
BUN/CREA RATIO: 27
BUN: 17 mg/dL (ref 10–25)
BUN: 17 mg/dL (ref 10–25)
CALCIUM: 8.7 mg/dL — ABNORMAL LOW (ref 8.8–10.3)
CALCIUM: 8.8 mg/dL (ref 8.8–10.3)
CHLORIDE: 95 mmol/L — ABNORMAL LOW (ref 98–111)
CHLORIDE: 95 mmol/L — ABNORMAL LOW (ref 98–111)
CHLORIDE: 95 mmol/L — ABNORMAL LOW (ref 98–111)
CO2 TOTAL: 23 mmol/L (ref 21–35)
CO2 TOTAL: 21 mmol/L (ref 21–35)
CREATININE: 0.61 mg/dL (ref <=1.30)
CREATININE: 0.61 mg/dL (ref <=1.30)
CREATININE: 0.63 mg/dL (ref <=1.30)
ESTIMATED GFR: >60 mL/min/1.73mˆ2
ESTIMATED GFR: >60 mL/min/1.73mˆ2
GLUCOSE: 150 mg/dL — ABNORMAL HIGH (ref 70–110)
GLUCOSE: 164 mg/dL — ABNORMAL HIGH (ref 70–110)
POTASSIUM: 4.5 mmol/L (ref 3.5–5.0)
POTASSIUM: 4.6 mmol/L (ref 3.5–5.0)
SODIUM: 128 mmol/L — ABNORMAL LOW (ref 135–145)
SODIUM: 127 mmol/L — ABNORMAL LOW (ref 135–145)

## 2017-05-16 LAB — MANUAL DIFFERENTIAL
BAND %: 4 % (ref 0–6)
METAMYELOCYTE %: 1 %
METAMYELOCYTE %: 1 %
METAMYELOCYTE ABSOLUTE: 0.04 10*3/uL
MONOCYTE %: 12 % — ABNORMAL HIGH (ref 2–11)
MYELOCYTE %: 2 %
MYELOCYTE ABSOLUTE: 0.08 10*3/uL
NEUTROPHIL %: 71 % — ABNORMAL HIGH (ref 50–70)
NEUTROPHIL ABSOLUTE: 2.85 10*3/uL
WBC: 3.8 x10ˆ3/uL

## 2017-05-16 LAB — ECG 12 LEAD - ADULT
Calculated P Axis: 44 deg
Calculated T Axis: 50 deg
EKG Severity: NORMAL
Heart Rate: 103 {beats}/min
I 40 Axis: -55 deg
PR Interval: 144 ms
QRS Axis: -18 deg
QRS Duration: 86 ms
QT Interval: 320 ms
QTC Calculation: 419 ms
ST Axis: 92 deg
T 40 Axis: -23 deg

## 2017-05-16 LAB — COMPREHENSIVE METABOLIC PANEL, NON-FASTING
ALBUMIN: 2.8 g/dL — ABNORMAL LOW (ref 3.2–4.6)
ALKALINE PHOSPHATASE: 355 U/L — ABNORMAL HIGH (ref 20–130)
ALKALINE PHOSPHATASE: 355 U/L — ABNORMAL HIGH (ref 20–130)
ALT (SGPT): 86 U/L — ABNORMAL HIGH (ref <=52)
ANION GAP: 7 mmol/L
AST (SGOT): 154 U/L — ABNORMAL HIGH (ref <=35)
BILIRUBIN TOTAL: 0.9 mg/dL (ref 0.3–1.2)
BUN/CREA RATIO: 30
BUN/CREA RATIO: 30
BUN: 18 mg/dL (ref 10–25)
CALCIUM: 8.6 mg/dL — ABNORMAL LOW (ref 8.8–10.3)
CHLORIDE: 98 mmol/L (ref 98–111)
CO2 TOTAL: 23 mmol/L (ref 21–35)
CREATININE: 0.61 mg/dL (ref <=1.30)
ESTIMATED GFR: >60 mL/min/1.73mˆ2
GLUCOSE: 142 mg/dL — ABNORMAL HIGH (ref 70–110)
POTASSIUM: 4.5 mmol/L (ref 3.5–5.0)
PROTEIN TOTAL: 5.1 g/dL — ABNORMAL LOW (ref 6.0–8.3)
SODIUM: 128 mmol/L — ABNORMAL LOW (ref 135–145)

## 2017-05-16 LAB — CBC WITH DIFF
BASOPHIL #: 0 x10ˆ3/uL (ref 0.00–0.20)
BASOPHIL %: 1 %
EOSINOPHIL #: 0 x10ˆ3/uL (ref 0.00–0.50)
EOSINOPHIL %: 0 %
HCT: 28.4 % — ABNORMAL LOW (ref 38.9–50.5)
HGB: 9.1 g/dL — ABNORMAL LOW (ref 13.4–17.3)
LYMPHOCYTE #: 0.4 10*3/uL — ABNORMAL LOW (ref 0.80–3.20)
LYMPHOCYTE %: 10 %
MCH: 32.1 pg (ref 27.9–33.1)
MCHC: 32.2 g/dL — ABNORMAL LOW (ref 32.8–36.0)
MCV: 99.9 fL — ABNORMAL HIGH (ref 82.4–95.0)
MONOCYTE #: 0.5 10*3/uL (ref 0.20–0.80)
MONOCYTE %: 13 %
MPV: 7.3 fL (ref 6.0–10.2)
NEUTROPHIL #: 2.9 x10ˆ3/uL (ref 1.60–5.50)
NEUTROPHIL %: 76 %
PLATELETS: 213 10*3/uL (ref 140–440)
RBC: 2.84 10*6/uL — ABNORMAL LOW (ref 4.40–5.68)
RDW: 21.5 % — ABNORMAL HIGH (ref 10.9–15.1)
WBC: 3.8 10*3/uL (ref 3.3–9.3)

## 2017-05-16 LAB — MAGNESIUM: MAGNESIUM: 1.8 mg/dL (ref 1.8–2.3)

## 2017-05-16 LAB — POC BLOOD GLUCOSE (RESULTS)
GLUCOSE, POC: 166 mg/dl — ABNORMAL HIGH (ref 70–110)
GLUCOSE, POC: 157 mg/dl — ABNORMAL HIGH (ref 70–110)
GLUCOSE, POC: 118 mg/dl — ABNORMAL HIGH (ref 70–110)
GLUCOSE, POC: 140 mg/dl — ABNORMAL HIGH (ref 70–110)

## 2017-05-16 LAB — THYROID STIMULATING HORMONE WITH FREE T4 REFLEX: TSH: 1.805 u[IU]/mL (ref 0.450–5.330)

## 2017-05-16 LAB — PHOSPHORUS: PHOSPHORUS: 3.8 mg/dL (ref 2.5–4.5)

## 2017-05-16 MED ORDER — LACTULOSE 20 GRAM/30 ML ORAL SOLUTION
30.0000 mL | Freq: Once | ORAL | Status: AC
Start: 2017-05-16 — End: 2017-05-16
  Administered 2017-05-16 (×2): 30 mL via ORAL
  Filled 2017-05-16: qty 30

## 2017-05-16 MED ORDER — AMIODARONE 200 MG TABLET
400.00 mg | ORAL_TABLET | Freq: Two times a day (BID) | ORAL | Status: DC
Start: 2017-05-16 — End: 2017-05-19
  Administered 2017-05-16 – 2017-05-18 (×6): 400 mg via ORAL
  Filled 2017-05-16 (×8): qty 2

## 2017-05-16 MED ORDER — SODIUM PHOSPHATES 19 GRAM-7 GRAM/118 ML ENEMA
133.00 mL | ENEMA | Freq: Once | RECTAL | Status: DC | PRN
Start: 2017-05-16 — End: 2017-05-20

## 2017-05-16 NOTE — Care Plan (Signed)
[facility]  Care Management Note    Patient Name: Joe Carroll  Date of Birth: [birth year]  Sex: male  Date/Time of Admission: 05/14/2017  1:38 PM  Room/Bed: 6128/A  Payor: MEDICARE / Plan: MEDICARE PART A AND B / Product Type: Medicare /    LOS: 2 days   PCP: Prescilla Sours, MD    Admitting Diagnosis:  Syncope [R55]    Assessment:   No d/c needs identified at this time. Will continue to monitor for developing needs.    Discharge Plan:    Home    The patient will continue to be evaluated for developing discharge needs.     Case Manager: Joya Gaskins, CASE MANAGER  Phone: [phone]

## 2017-05-16 NOTE — Nurses Notes (Signed)
Pt request a enema paged hospitalist. Order received.Ignatius Specking, RN

## 2017-05-16 NOTE — Consults (Signed)
[facility]  Cardiology   Progress Note      Adarsh, Mundorf  Date of Admission:  05/14/2017  Date of service: 05/16/2017  Date of Birth:  02-16-1941  MRN:  [id number]  Hospital Day:  LOS: 2 days     Subjective:     Patient is feeling much better per denies any chest pain, short of breath, PND, orthopnea    Objective:     Vital Signs:  Filed Vitals:    05/15/17 1445 05/15/17 1500 05/15/17 2108 05/16/17 0000   BP:  (!) 166/81 (!) 159/76 (!) 157/74   Pulse: (!) 132 (!) 107 (!) 116 (!) 112   Resp:  18 18 18    Temp:  36.7 C (98.1 F) 37.2 C (99 F) 37.2 C (99 F)   SpO2:   99% 97%       Physical Exam:    General: Patient not in any acute distress.   HEENT: No JVD, No carotid bruit b/l  Lungs: Clear to auscultation and percussion.  Cardiovascular: Heart sounds are regular, systolic murmur.  [place]  Extremities: No edema, good peripheral pulses.  Neurology: Alert, awake and oriented x3     Telemetry reviewed:  Normal sinus rhythm 70-80    Labs:  No results found for: CHOLESTEROL, HDLCHOL, LDLCHOL, LDLCHOLDIR, TRIG  CBC Results   Recent Labs     05/14/17  1523 05/15/17  0405   WBC 4.7  4.7 4.1  4.1   HGB 10.1* 8.9*   HCT 30.5* 27.1*   PLTCNT 231 203   BANDS  --  10*      BMP Results   Recent Labs     05/15/17  1012 05/15/17  1221 05/15/17  1826 05/16/17  0201   SODIUM 125* 126* 126* 128*   POTASSIUM 4.7 4.8 4.6 4.5   CHLORIDE 93* 94* 94* 95*   CO2 22 22 22 23    BUN 18 19 19 17    CREATININE 0.70 0.73 0.71 0.61   GFR >60 >60 >60 >60   ANIONGAP 10 10 10 10      Recent Labs     05/15/17  0405  05/15/17  1012 05/15/17  1221 05/15/17  1826 05/16/17  0201   CALCIUM 8.4*   < > 8.3* 8.5* 8.5* 8.7*   MAGNESIUM 1.7*  --   --   --   --   --     < > = values in this interval not displayed.      Coag Results   Recent Labs     05/14/17  1523 05/15/17  0404   INR 1.18* 1.17*   APTT 27.7  --       Cardiac Results      Recent Labs     05/14/17  1523 05/14/17  2323 05/15/17  0405   UHCEASTTROPI 0.02 0.02 0.02   BNP 68  --   --         Imaging:  XR CHEST  PA AND LATERAL   Final Result   Trace bilateral pleural effusions.            Radiologist location ID: [id number]             Current Medications:    Current Facility-Administered Medications:  acetaminophen (TYLENOL) tablet 650 mg Oral Q6H PRN   aspirin chewable tablet 81 mg 81 mg Oral Daily   dextrose 50% (0.5 g/mL) injection - syringe 25 g Intravenous Q15 Min PRN   docusate sodium (  COLACE) capsule 100 mg Oral 2x/day   enoxaparin PF (LOVENOX) 40 mg/0.4 mL SubQ injection 40 mg Subcutaneous Q24H   sitaGLIPtin (JANUVIA) tablet 50 mg Oral 2x/day-Food   And      metFORMIN (GLUCOPHAGE) tablet 500 mg Oral 2x/day-Food   metoprolol tartrate (LOPRESSOR) tablet 12.5 mg Oral 2x/day   NS flush syringe 3 mL Intracatheter Q8HRS   NS flush syringe 3 mL Intracatheter Q1H PRN   NS premix infusion  Intravenous Continuous   ondansetron (ZOFRAN) 2 mg/mL injection 4 mg Intravenous Q8H PRN   predniSONE (DELTASONE) tablet 5 mg Oral 2x/day-Food   salicylic acid-sulfur 0%-1% topical shampoo  Apply Topically Once   SSIP insulin lispro (HUMALOG) 100 units/mL injection 1-12 Units Subcutaneous 4x/day AC   tamsulosin (FLOMAX) capsule 0.4 mg Oral Daily     Allergies   Allergen Reactions   . Ciprofloxacin Hives/ Urticaria       Assessment/ Plan:    Atrial fibrillation, paroxysmal patient is back in normal sinus rhythm patient refused anticoagulation he has bleeding he has low hemoglobin.  Spoke risks and benefits of amiodarone he like to try  Plan will give amiodarone 400 p.o. B.i.d. 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

## 2017-05-16 NOTE — Care Plan (Signed)
.  Patient remains alert and oriented. complained of not having BM since 05/13/17 obtained order for lactulose, pt has been tachycardic most of shift, Telemetry monitored.  Medication administered per order.  Patients vitals signs remain stable.  Skin dry.  Patient has been free of falls.  Patients mood has been appropriate.  Education done per patient care plan.Cy Blamer, RN

## 2017-05-16 NOTE — Care Plan (Signed)
Pt is resting in bed. BP (!) 158/72   Pulse (!) 107   Temp 36.8 C (98.2 F)   Resp 18   Ht 1.74 m (5' 8.5")   Wt 82.8 kg (182 lb 9.6 oz)   SpO2 99%   BMI 27.36 kg/m pt assisted to bathroom by staff to reduce fall risk. S/s of syncope assessed to reduce fall risk. Pain assessed to maintain optimal pain level. Pt was fall free throughout the shift. Pt is in no distress. Ignatius Specking, RN    Problem: Adult Inpatient Plan of Care  Goal: Plan of Care Review  Outcome: Ongoing (see interventions/notes)  Goal: Patient-Specific Goal (Individualization)  Outcome: Ongoing (see interventions/notes)  Goal: Absence of Hospital-Acquired Illness or Injury  Outcome: Ongoing (see interventions/notes)  Goal: Optimal Comfort and Wellbeing  Outcome: Ongoing (see interventions/notes)  Goal: Rounds/Family Conference  Outcome: Ongoing (see interventions/notes)     Problem: Syncope  Goal: Absence of Syncopal Symptoms  Outcome: Ongoing (see interventions/notes)     Problem: Fall Injury Risk  Goal: Absence of Fall and Fall-Related Injury  Outcome: Ongoing (see interventions/notes)     Problem: Pain Acute  Goal: Optimal Pain Control  Outcome: Ongoing (see interventions/notes)

## 2017-05-16 NOTE — Care Plan (Signed)
Problem: Adult Inpatient Plan of Care  Goal: Plan of Care Review  Outcome: Ongoing (see interventions/notes)  Goal: Patient-Specific Goal (Individualization)  Outcome: Ongoing (see interventions/notes)  Goal: Absence of Hospital-Acquired Illness or Injury  Outcome: Ongoing (see interventions/notes)  Goal: Optimal Comfort and Wellbeing  Outcome: Ongoing (see interventions/notes)  Goal: Rounds/Family Conference  Outcome: Ongoing (see interventions/notes)

## 2017-05-16 NOTE — Progress Notes (Signed)
[facility]  Neurology Progress Note      Darry, Kelnhofer, 77 y.o. male  Date of Admission:  05/14/2017  Date of service: 05/16/2017  Date of Birth:  12-18-1940    Assessment/Plan:  Syncope due to hypotension and Afib  Patient didn't have a sz or a TIA.  All sxs all related to Afib.  On maximal medical Mx.  Doing well.     Subjective   Patient doing well, without complaints. He denies new neurological symptoms. He has not had any syncope or near syncope. No loss of consciousness or seizure activity. Denies dysphagia, headaches, double vision.     Review of Systems: negative      Objective   Vital Signs:  Temp (24hrs) Max:37.2 C (99 F)      Systolic (16XWR), UEA:540 , Min:140 , JWJ:191     Diastolic (47WGN), FAO:13, Min:71, Max:81    Temp  Avg: 37 C (98.6 F)  Min: 36.7 C (98.1 F)  Max: 37.2 C (99 F)  MAP (Non-Invasive)  Avg: 96.7 mmHG  Min: 94 mmHG  Max: 99 mmHG  Pulse  Avg: 107.7  Min: 97  Max: 116  Resp  Avg: 18.7  Min: 18  Max: 20  SpO2  Avg: 98.2 %  Min: 97 %  Max: 99 %  Pain Score (Numeric, Faces): 0    Today's Physical Exam:  General: In non acute distress  HEENT: Normocephalic, atraumatic, no vision changes, normal hearing, mucous membranes intact, moist.   Neck: Supple, symmetrical, trachea midline  Abdomen: SNTND  Extremities: No cyanosis or edema  Mental status: AAOx3  Language and Speech: No dysarthria or aphasia.  Cranial nerves: EOMI, PERRLA, Face symmetric, no nystagmus, tongue midline, normal hearing, normal facial sensation.  Muscle tone: normal tone.  Motor strength: 5/5 throughout.  Sensory: vibration, proprioception, pinprick and temperature are all normal.   Gait: normal gait, able, Reason:   Coordination: normal finger to nose and rapid alternating hand movements, without tremor  Reflexes: 2+ throughout.    Current Facility-Administered Medications:   .  acetaminophen (TYLENOL) tablet, 650 mg, Oral, Q6H PRN, Shelbie Hutching, NP  .  amiodarone (CORDARONE) tablet, 400 mg, Oral,  2x/day, Riley Churches, MD, 400 mg at 05/16/17 1224  .  aspirin chewable tablet 81 mg, 81 mg, Oral, Daily, Shelbie Hutching, NP, 81 mg at 05/16/17 0865  .  dextrose 50% (0.5 g/mL) injection - syringe, 25 g, Intravenous, Q15 Min PRN, Shelbie Hutching, NP  .  docusate sodium (COLACE) capsule, 100 mg, Oral, 2x/day, Azucena Fallen, DO, 100 mg at 05/16/17 7846  .  enoxaparin PF (LOVENOX) 40 mg/0.4 mL SubQ injection, 40 mg, Subcutaneous, Q24H, Shelbie Hutching, NP, Stopped at 05/14/17 2100  .  lactulose (ENULOSE) 20g per 15mL oral liquid, 30 mL, Oral, Once, Azucena Fallen, DO  .  sitaGLIPtin (JANUVIA) tablet, 50 mg, Oral, 2x/day-Food, 50 mg at 05/16/17 0838 **AND** metFORMIN (GLUCOPHAGE) tablet, 500 mg, Oral, 2x/day-Food, Azucena Fallen, DO, 500 mg at 05/16/17 9629  .  metoprolol tartrate (LOPRESSOR) tablet, 12.5 mg, Oral, 2x/day, Azucena Fallen, DO, 12.5 mg at 05/16/17 5284  .  NS flush syringe, 3 mL, Intracatheter, Q8HRS, Shelbie Hutching, NP, Stopped at 05/16/17 1400  .  NS flush syringe, 3 mL, Intracatheter, Q1H PRN, Shelbie Hutching, NP  .  NS premix infusion, , Intravenous, Continuous, Shelbie Hutching, NP, Last Rate: 50 mL/hr at 05/15/17 2117  .  ondansetron (ZOFRAN)  2 mg/mL injection, 4 mg, Intravenous, Q8H PRN, Azucena Fallen, DO, 4 mg at 05/16/17 1230  .  predniSONE (DELTASONE) tablet, 5 mg, Oral, 2x/day-Food, Shelbie Hutching, NP, 5 mg at 05/16/17 2229  .  salicylic acid-sulfur 7%-9% topical shampoo, , Apply Topically, Once, Gracelyn Nurse, MD, Stopped at 05/15/17 1700  .  SSIP insulin lispro (HUMALOG) 100 units/mL injection, 1-12 Units, Subcutaneous, 4x/day AC, Shelbie Hutching, NP, 2 Units at 05/16/17 1224  .  tamsulosin (FLOMAX) capsule, 0.4 mg, Oral, Daily, Shelbie Hutching, NP, Stopped at 05/15/17 0813    Review of reports and notes reveal: complete.  BMP  (Last 24 hours)    Date/Time Na K Cl CO2 BUN CREAT Calcium Glucose    05/16/17 1223 128 (L)     4.5    98    23    18    0.61    8.6 (L)    142 (H)       05/16/17 0443 127 (L)    4.6    95 (L)    21    17    0.63    8.8    164 (H)       05/16/17 0201 128 (L)    4.5    95 (L)    23    17    0.61    8.7 (L)    150 (H)       05/15/17 1826 126 (L)    4.6    94 (L)    22    19    0.71    8.5 (L)    138 (H)           CBC  (Last 24 hours)    Date/Time WBC HGB HCT MCV PLATELETS    05/16/17 0443 3.8    9.1 (L)    28.4 (L)    99.9 (H)    213       05/16/17 0443 3.8    -- -- -- --      WBC/Diff  (Last 24 hours)    Date/Time WBC Band PMN Lymp Mono Eos Baso Prom Myelo Meta Blasts    05/16/17 0443 3.8    -- 76    10    13    0    1    -- -- -- --    05/16/17 0443 3.8    4    71 (H)    10 (L)    12 (H)    -- -- -- 2    1    --          Results for orders placed or performed during the hospital encounter of 05/14/17 (from the past 72 hour(s))   XR CHEST PA AND LATERAL     Status: None    Narrative    Kaelon D Modisette    PROCEDURE DESCRIPTION: XR CHEST PA AND LATERAL    PROCEDURE PERFORMED DATE AND TIME: 05/14/2017 3:33 PM    CLINICAL INDICATION: syncope    TECHNIQUE: 2 views / 2 images submitted.    COMPARISON: No prior studies were compared.      FINDINGS: No evidence of pneumonia, pneumothorax. No pulmonary edema. Trace  bilateral pleural effusions. Cardiac mediastinal silhouette is within  normal limits. Right IJ chest port with tip terminating in expected  location of the central SVC.      Impression    Trace bilateral pleural effusions.  Radiologist location ID: [id number]       No results found for this visit on 05/14/17 (from the past 720 hour(s)).  Recent Results (from the past 720 hour(s))   XR CHEST PA AND LATERAL    Collection Time: 05/14/17  3:33 PM    Narrative    Louann Liv    PROCEDURE DESCRIPTION: XR CHEST PA AND LATERAL    PROCEDURE PERFORMED DATE AND TIME: 05/14/2017 3:33 PM    CLINICAL INDICATION: syncope    TECHNIQUE: 2 views / 2 images submitted.    COMPARISON: No prior studies were  compared.      FINDINGS: No evidence of pneumonia, pneumothorax. No pulmonary edema. Trace  bilateral pleural effusions. Cardiac mediastinal silhouette is within  normal limits. Right IJ chest port with tip terminating in expected  location of the central SVC.      Impression    Trace bilateral pleural effusions.        Radiologist location ID: [id number]         Ledora Bottcher, APRN,NP-C  05/16/2017, 16:24

## 2017-05-16 NOTE — Progress Notes (Signed)
[facility]  HOSPITALIST PROGRESS NOTE      Joe Carroll, 77 y.o. male  Date of Admission:  05/14/2017  Date of service: 05/16/2017  Date of Birth:  12-Dec-1940    Assessment/Plan:    Syncope  -stable  -likely orthostatic hypotension and paroxysmal atrial fibrillation, orthostatics grossly positive on admission   -metoprolol 12.5 mg p.o. B.i.d.  -monitor on telemetry, heart rate improving  -Cardiology and neurology   -high fall risk precautions    Hyponatremia  -slowly improving with saline  -continue to monitor  -continue NS    Paroxysmal atrial fibrillation  -HR improving  -restart metoprolol 12.5 mg p.o. B.i.d.  - s/p IV digoxin 250 mcg once per cardiology   - patient started on amiodarone 400 milligrams b.i.d. per Cardiology today  -monitor on telemetry   -continue aspirin    Urinary retention/BPH/prostate cancer  -maintain indwelling Foley catheter  -recommend resuming Flomax 0.4 mg p.o. daily  -continue follow-up with primary Urology as scheduled    Diabetes mellitus type 2  -continue baseline Janumet  -place on sliding scale insulin coverage per protocol  -diabetic diet      Subjective   Feeling a little better today.  Having some constipation.  No palpitations or lightheadedness.  No nausea vomiting or diarrhea.      Current Medications:    Current Facility-Administered Medications:  acetaminophen (TYLENOL) tablet 650 mg Oral Q6H PRN   amiodarone (CORDARONE) tablet 400 mg Oral 2x/day   aspirin chewable tablet 81 mg 81 mg Oral Daily   dextrose 50% (0.5 g/mL) injection - syringe 25 g Intravenous Q15 Min PRN   docusate sodium (COLACE) capsule 100 mg Oral 2x/day   enoxaparin PF (LOVENOX) 40 mg/0.4 mL SubQ injection 40 mg Subcutaneous Q24H   sitaGLIPtin (JANUVIA) tablet 50 mg Oral 2x/day-Food   And      metFORMIN (GLUCOPHAGE) tablet 500 mg Oral 2x/day-Food   metoprolol tartrate (LOPRESSOR) tablet 12.5 mg Oral 2x/day   NS flush syringe 3 mL Intracatheter Q8HRS   NS flush syringe 3 mL Intracatheter Q1H  PRN   NS premix infusion  Intravenous Continuous   ondansetron (ZOFRAN) 2 mg/mL injection 4 mg Intravenous Q8H PRN   predniSONE (DELTASONE) tablet 5 mg Oral 2x/day-Food   salicylic acid-sulfur 3%-0% topical shampoo  Apply Topically Once   SSIP insulin lispro (HUMALOG) 100 units/mL injection 1-12 Units Subcutaneous 4x/day AC   tamsulosin (FLOMAX) capsule 0.4 mg Oral Daily       Objective    Vital Signs:  Temperature: 36.7 C (98.1 F)  Heart Rate: (!) 104  BP (Non-Invasive): 140/81  Respiratory Rate: 19  SpO2: 98 %  Pain Score (Numeric, Faces): 0  Liter Flow (L/Min):       Intake & Output      Intake/Output Summary (Last 24 hours) at 05/16/2017 1646  Last data filed at 05/16/2017 1633  Gross per 24 hour   Intake 999 ml   Output 1050 ml   Net -51 ml       Today's Physical Exam  Constitutional:  No distress and alert and oriented.  Eyes:  Conjunctiva clear, Pupils equal and round.   Neck:  Supple, symmetrical, trachea midline.  Respiratory:  Clear to auscultation bilaterally, no wheezes.  Cardiovascular:  Irregularly irregular, no murmurs.  Gastrointestinal:  Soft, non-tender, Bowel sounds normal, non-distended.  Musculoskeletal:  Moves all 4 limbs  Integumentary:  Skin warm and dry, No rashes or lesions.  Neurologic:  CN II - XII  grossly intact, No tremor.   Extremities: No BLE edema       Labs   CBC  (Last 48 hours)    Date/Time WBC HGB HCT MCV PLATELETS    05/16/17 0443 3.8    9.1 (L)    28.4 (L)    99.9 (H)    213       05/16/17 0443 3.8    -- -- -- --    05/15/17 0405 4.1    8.9 (L)    27.1 (L)    98.2 (H)    203       05/15/17 0405 4.1    -- -- -- --            BMP  (Last 48 hours)    Date/Time Na K Cl CO2 BUN CREAT Calcium Glucose    05/16/17 1223 128 (L)    4.5    98    23    18    0.61    8.6 (L)    142 (H)       05/16/17 0443 127 (L)    4.6    95 (L)    21    17    0.63    8.8    164 (H)       05/16/17 0201 128 (L)    4.5    95 (L)    23    17    0.61    8.7 (L)    150 (H)       05/15/17 1826 126 (L)    4.6     94 (L)    22    19    0.71    8.5 (L)    138 (H)       05/15/17 1221 126 (L)    4.8    94 (L)    22    19    0.73    8.5 (L)    126 (H)       05/15/17 1012 125 (L)    4.7    93 (L)    22    18    0.70    8.3 (L)    165 (H)       05/15/17 0623 126 (L)    5.0    93 (L)    20 (L)    17    0.74    8.7 (L)    155 (H)       05/15/17 0405 125 (L)    4.6    93 (L)    22    18    0.66    8.4 (L)    145 (H)       05/14/17 2323 124 (L)    4.5    92 (L)    22    20    0.68    8.4 (L)    149 (H)           Joe Carroll Joe Carroll, Joe Carroll   05/16/2017 16:46

## 2017-05-17 LAB — MANUAL DIFFERENTIAL
BAND %: 12 % — ABNORMAL HIGH (ref 0–6)
BAND %: 12 % — ABNORMAL HIGH (ref 0–6)
BASOPHIL %: 2 % (ref 0–2)
BASOPHIL ABSOLUTE: 0.07 x10ˆ3/uL
LYMPHOCYTE %: 8 % — ABNORMAL LOW (ref 20–40)
LYMPHOCYTE ABSOLUTE: 0.3 x10ˆ3/uL
METAMYELOCYTE %: 1 %
METAMYELOCYTE ABSOLUTE: 0.04 x10ˆ3/uL
MONOCYTE %: 7 % (ref 2–11)
MONOCYTE ABSOLUTE: 0.26 10*3/uL
MONOCYTE ABSOLUTE: 0.26 x10ˆ3/uL
MYELOCYTE %: 3 %
MYELOCYTE ABSOLUTE: 0.11 x10ˆ3/uL
NEUTROPHIL %: 67 % (ref 50–70)
NEUTROPHIL ABSOLUTE: 2.92 x10ˆ3/uL
PLATELET ESTIMATE: ADEQUATE
WBC: 3.7 x10ˆ3/uL

## 2017-05-17 LAB — CBC WITH DIFF
BASOPHIL #: 0 x10ˆ3/uL (ref 0.00–0.20)
BASOPHIL %: 1 %
EOSINOPHIL #: 0 x10ˆ3/uL (ref 0.00–0.50)
EOSINOPHIL %: 0 %
HCT: 26.1 % — ABNORMAL LOW (ref 38.9–50.5)
HGB: 8.5 g/dL — ABNORMAL LOW (ref 13.4–17.3)
LYMPHOCYTE #: 0.6 x10ˆ3/uL — ABNORMAL LOW (ref 0.80–3.20)
LYMPHOCYTE %: 16 %
MCH: 32.5 pg (ref 27.9–33.1)
MCHC: 32.4 g/dL — ABNORMAL LOW (ref 32.8–36.0)
MCV: 100.4 fL — ABNORMAL HIGH (ref 82.4–95.0)
MONOCYTE #: 0.4 x10ˆ3/uL (ref 0.20–0.80)
MONOCYTE %: 11 %
MPV: 7.3 fL (ref 6.0–10.2)
NEUTROPHIL #: 2.7 x10ˆ3/uL (ref 1.60–5.50)
NEUTROPHIL %: 73 %
PLATELETS: 192 x10ˆ3/uL (ref 140–440)
RBC: 2.61 x10?6/uL — ABNORMAL LOW (ref 4.40–5.68)
RBC: 2.61 x10ˆ6/uL — ABNORMAL LOW (ref 4.40–5.68)
RDW: 21.8 % — ABNORMAL HIGH (ref 10.9–15.1)
WBC: 3.7 x10ˆ3/uL (ref 3.3–9.3)

## 2017-05-17 LAB — POC BLOOD GLUCOSE (RESULTS)
GLUCOSE, POC: 104 mg/dl (ref 70–110)
GLUCOSE, POC: 98 mg/dl (ref 70–110)
GLUCOSE, POC: 94 mg/dl (ref 70–110)

## 2017-05-17 LAB — BASIC METABOLIC PANEL
ANION GAP: 9 mmol/L
BUN/CREA RATIO: 28
BUN: 17 mg/dL (ref 10–25)
CALCIUM: 8.5 mg/dL — ABNORMAL LOW (ref 8.8–10.3)
CHLORIDE: 98 mmol/L (ref 98–111)
CO2 TOTAL: 21 mmol/L (ref 21–35)
CREATININE: 0.6 mg/dL (ref <=1.30)
ESTIMATED GFR: >60 mL/min/1.73m?2
ESTIMATED GFR: >60 mL/min/{1.73_m2}
GLUCOSE: 103 mg/dL (ref 70–110)
POTASSIUM: 4.3 mmol/L (ref 3.5–5.0)
SODIUM: 128 mmol/L — ABNORMAL LOW (ref 135–145)

## 2017-05-17 LAB — MAGNESIUM: MAGNESIUM: 1.9 mg/dL (ref 1.8–2.3)

## 2017-05-17 LAB — URINE CULTURE,ROUTINE: URINE CULTURE: NO GROWTH

## 2017-05-17 LAB — PHOSPHORUS: PHOSPHORUS: 3.6 mg/dL (ref 2.5–4.5)

## 2017-05-17 MED ADMIN — lactated Ringers intravenous solution: SUBCUTANEOUS | @ 21:00:00 | NDC 00264775000

## 2017-05-17 MED ADMIN — sodium chloride 0.9 % intravenous solution: INTRAVENOUS | @ 11:00:00 | NDC 00338004904

## 2017-05-17 NOTE — Care Plan (Signed)
[place]  Physical Therapy Initial Evaluation    Patient Name: Joe Carroll  Date of Birth: [birth year]  Height: Height: 174 cm (5' 8.5")  Weight: Weight: 82.9 kg (182 lb 12.2 oz)  Room/Bed: 6128/A  Payor: MEDICARE / Plan: MEDICARE PART A AND B / Product Type: Medicare /     Assessment:      (P) PT eval completed as allowed by pt anxiety. Pt refuses ambulation d/t fear of syncope however does march in place w/ RN monitoring telemetry and w/o LOB or assist required. Pt does not demonstrate strength or balance deficits and no PT needs are anticipated at d/c. PT to f/u 1-2 visits to ensure pt begins ambulating while admitted and to assess if FWW is truly required as his strength and balance do not currently indicate a need.    Discharge Needs:    Equipment Recommendation: (P) front wheeled walker(if continuing to use at d/c)        Discharge Disposition: (P) home    JUSTIFICATION OF DISCHARGE RECOMMENDATION   Based on current diagnosis, functional performance prior to admission, and current functional performance, this patient requires continued PT services in (P) home in order to achieve significant functional improvements in these deficit areas:  .        Plan:   Current Intervention: (P) gait training, home exercise program, patient/family education  To provide physical therapy services (P) minimum of 2x/week, minimum of 3x/week  for duration of (P) other (comment)(1-2 f/u visits).    The risks/benefits of therapy have been discussed with the patient/caregiver and he/she is in agreement with the established plan of care.       Subjective & Objective        05/17/17 1546   Therapist Pager   PT Assigned/ Pager # [place] [phone]   Rehab Session   Document Type evaluation   Total PT Minutes: 23   Patient Effort good   Symptoms Noted During/After Treatment other (see comments)   Symptoms Noted Comment pt very anxious and fixated on HR   General Information   Pertinent History of  Current Functional Problem 77 y/o male admitted d/t syncopal episode w/ recently dx PAF and suspected orthostatic hypotension w/ pt now extremely fearful of falling/syncope. PMH: PAF, acute renal failure (follows at [facility] in [place] per pt w/ chronic indwelling catheter), BPH, T2DM, prostate CA   Respiratory Status room air   Existing Precautions/Restrictions fall precautions   Mutuality/Individual Preferences   Individualized Care Needs assist of one w/FWW   Living Environment   Lives With spouse   Living Arrangements house   Home Accessibility stairs to enter home   Number of Stairs to [place] 2   Transportation Available car   Functional Level Prior   Ambulation 0 - independent   Transferring 0 - independent   Toileting 0 - independent   Bathing 0 - independent   Dressing 0 - independent   Eating 0 - independent   Communication 0 - understands/communicates without difficulty   Swallowing 0-->swallows foods/liquids without difficulty   Self-Care   Current Activity Tolerance fair   Equipment Currently Used at Home no   Pre Treatment Status   Pre Treatment Patient Status Patient supine in bed;Call light within reach;Telephone within reach   Support Present Pre Treatment  None   Communication Pre Treatment  Nurse   Communication Pre Treatment Comment pt very anxious about HR and possibility of syncope and insistent that MD  does not want him OOB. RN enters to assist PT and assures pt, as PT has, that MD ordered PT and that OOB w/ staff supervision is appropriate and safe   Cognitive Assessment/Interventions   Behavior/Mood Observations anxious;cooperative   Orientation Status oriented x 4   Attention WNL/WFL   Follows Commands WNL   Vital Signs   Vitals Comment RN monitors HR t/o session, pt asymptomatic however HR does elevate into the 140's w/ marching   Pain Assessment   Pain Scale: Numbers, Pretreatment 0/10 - no pain   Pain Scale: Numbers, Post-Treatment 0/10 - no pain   General Extremity Assessment   Comment  grossly wfl   RUE Assessment   RUE Assessment WFL for stated baseline   LUE Assessment   LUE Assessment WFL for stated baseline   RLE Assessment   RLE Assessment WFL for stated baseline   LLE Assessment   LLE Assessment WFL for stated baseline   Trunk Assessment   Trunk Assessment WFL for stated baseline   Bed Mobility Assessment/Treatment   Supine-Sit Independence independent   Transfer Assessment/Treatment   Sit-Stand Independence modified independence   Stand-Sit Independence modified independence   Transfer Comment initially w/ stedy d/t pt request, subsequently w/ FWW, pt demonstrates strength and balance to attempt w/o device however is extremely anxious about this reulting in syncope or fall therefore not attempted this date   Gait Assessment/Treatment   Comment pt refuses ambulation d/t fear of falling/syncope however is able to march in place ww/ stedy then FWW w/o assist 2 reps of 5 min w/ seated rest between   Balance Skill Training   Standing Balance: Dynamic good balance   Therapeutic Exercise/Activity   Comment reviewed PT tx rationale, educated t/o that MD has requested PT and feels pt is stable for PT, encouraged t/o that all safety precautions possible have been taken to prevent falling/syncope   Post Treatment Status   Post Treatment Patient Status Patient sitting in bedside chair or w/c;Call light within reach;Telephone within reach   Support Present Post Treatment  None   Communication [place] Management  (nurse manager who is seated by care management)   Communication Post Treatment Comment no PT needs however extremely anxious about another syncopal episode which is limiting mobility   Physical Therapy Clinical Impression   Assessment PT eval completed as allowed by pt anxiety. Pt refuses ambulation d/t fear of syncope however does march in place w/ RN monitoring telemetry and w/o LOB or assist required. Pt does not demonstrate strength or balance deficits and no PT needs are  anticipated at d/c. PT to f/u 1-2 visits to ensure pt begins ambulating while admitted and to assess if FWW is truly required as his strength and balance do not currently indicate a need.   Rehab Potential good, to achieve stated therapy goals   Therapy Frequency minimum of 2x/week;minimum of 3x/week   Predicted Duration of Therapy Intervention (days/wks) other (comment)  (1-2 f/u visits)   Anticipated Equipment Needs at Discharge (PT) front wheeled walker  (if continuing to use at d/c)   Anticipated Discharge Disposition home   Evaluation Complexity Justification   Patient History: Co-morbidity/factors that impact Plan of Care 3 or more that impact Plan of Care   Examination Components 3 or more Exam elements addressed   Presentation Stable: Uncomplicated, straight-forward, problem focused   Clinical Decision Making Low complexity   Care Plan Goals   PT Rehab Goals Gait Training Goal;Physical Therapy Goal  Physical Therapy Goal   PT  Goal, Date Established 05/17/17   PT Goal, Time to Achieve by discharge   PT Goal, Activity Type HEP   PT Goal, Independence Level independent   Gait Training  Goal, Distance to Achieve   Gait Training  Goal, Date Established 05/17/17   Gait Training  Goal, Time to Achieve by discharge   Gait Training  Goal, Independence Level independent   Gait Training  Goal, Distance to Achieve 300   Planned Therapy Interventions, PT Eval   Planned Therapy Interventions (PT) gait training;home exercise program;patient/family education   [place] Patient Lifting and Movement Equipment    Lifting equipment used (see row detail for guideline): None       Therapist:   Vincent Peyer, PT 05/17/2017 16:00  Phone: [phone]

## 2017-05-17 NOTE — Care Plan (Addendum)
Problem: Adult Inpatient Plan of Care  Goal: Plan of Care Review  Outcome: Ongoing (see interventions/notes)  Goal: Patient-Specific Goal (Individualization)  Outcome: Ongoing (see interventions/notes)  Goal: Absence of Hospital-Acquired Illness or Injury  Outcome: Ongoing (see interventions/notes)  Goal: Optimal Comfort and Wellbeing  Outcome: Ongoing (see interventions/notes)  Goal: Rounds/Family Conference  Outcome: Ongoing (see interventions/notes)     Problem: Syncope  Goal: Absence of Syncopal Symptoms  Outcome: Ongoing (see interventions/notes)     Problem: Fall Injury Risk  Goal: Absence of Fall and Fall-Related Injury  Outcome: Ongoing (see interventions/notes)     Problem: Pain Acute  Goal: Optimal Pain Control  Outcome: Ongoing (see interventions/notes)     Problem: Discharge Needs Assessment  Goal: Discharge Needs Assessment  Outcome: Ongoing (see interventions/notes)     Problem: Skin Injury Risk Increased  Goal: Skin Health and Integrity  Outcome: Ongoing (see interventions/notes)     Pt remains in sinus rhythm throughout the night.  Fall precautions maintained.  Pt denies any c/o pain at this time.  Foley catheter draining to gravity with tea-colored urine with sediment. Anne Fu, RN  05/18/2017, 05:08

## 2017-05-17 NOTE — Progress Notes (Signed)
[facility]  Neurology Progress Note      Jahdiel, Krol, 77 y.o. male  Date of Admission:  05/14/2017  Date of service: 05/17/2017  Date of Birth:  1940/06/20    Assessment/Plan:  Syncope due to hypotension and Afib  Patient didn't have a sz or a TIA.  All sxs all related to Afib.  On maximal medical Mx.  Doing well    Subjective   Patient tells me he is feeling well without new neurological complaints. He denies syncope or near syncope. No loss of consciousness or seizure activity. Denies dysphagia, headaches, double vision.      Objective   Vital Signs:  Temp (24hrs) Max:36.9 C (44.3 F)      Systolic (15QMG), QQP:619 , Min:140 , JKD:326     Diastolic (71IWP), YKD:98, Min:68, Max:91    Temp  Avg: 36.7 C (98.1 F)  Min: 36.6 C (97.9 F)  Max: 36.9 C (98.4 F)  Pulse  Avg: 96.3  Min: 72  Max: 109  Resp  Avg: 18.3  Min: 18  Max: 19  SpO2  Avg: 98.4 %  Min: 98 %  Max: 99 %  Pain Score (Numeric, Faces): 0    Today's Physical Exam:  General: In non acute distress  HEENT: Normocephalic, atraumatic, no vision changes, normal hearing, mucous membranes intact, moist.   Neck: Supple, symmetrical, trachea midline  Abdomen: SNTND  Extremities: No cyanosis or edema  Mental status: AAOx3  Language and Speech: No dysarthria or aphasia.  Cranial nerves: EOMI, PERRLA, Face symmetric, no nystagmus, tongue midline, normal hearing, normal facial sensation.  Muscle tone: normal tone.  Motor strength: 5/5 throughout.  Sensory: vibration, proprioception, pinprick and temperature are all normal.   Gait: normal gait, able, Reason:   Coordination: normal finger to nose and rapid alternating hand movements, without tremor  Reflexes: 2+ throughout.    Current Facility-Administered Medications:   .  acetaminophen (TYLENOL) tablet, 650 mg, Oral, Q6H PRN, Shelbie Hutching, NP  .  amiodarone (CORDARONE) tablet, 400 mg, Oral, 2x/day, Riley Churches, MD, 400 mg at 05/17/17 0837  .  aspirin chewable tablet 81 mg, 81 mg, Oral,  Daily, Shelbie Hutching, NP, 81 mg at 05/17/17 3382  .  dextrose 50% (0.5 g/mL) injection - syringe, 25 g, Intravenous, Q15 Min PRN, Shelbie Hutching, NP  .  docusate sodium (COLACE) capsule, 100 mg, Oral, 2x/day, Azucena Fallen, DO, 100 mg at 05/17/17 5053  .  enoxaparin PF (LOVENOX) 40 mg/0.4 mL SubQ injection, 40 mg, Subcutaneous, Q24H, Shelbie Hutching, NP, Stopped at 05/14/17 2100  .  sitaGLIPtin (JANUVIA) tablet, 50 mg, Oral, 2x/day-Food, 50 mg at 05/17/17 0837 **AND** metFORMIN (GLUCOPHAGE) tablet, 500 mg, Oral, 2x/day-Food, Azucena Fallen, DO, 500 mg at 05/17/17 9767  .  metoprolol tartrate (LOPRESSOR) tablet, 12.5 mg, Oral, 2x/day, Azucena Fallen, DO, 12.5 mg at 05/17/17 3419  .  NS flush syringe, 3 mL, Intracatheter, Q8HRS, Shelbie Hutching, NP, Stopped at 05/16/17 1400  .  NS flush syringe, 3 mL, Intracatheter, Q1H PRN, Shelbie Hutching, NP  .  NS premix infusion, , Intravenous, Continuous, Shelbie Hutching, NP, Last Rate: 50 mL/hr at 05/15/17 2117  .  ondansetron (ZOFRAN) 2 mg/mL injection, 4 mg, Intravenous, Q8H PRN, Azucena Fallen, DO, 4 mg at 05/16/17 1230  .  predniSONE (DELTASONE) tablet, 5 mg, Oral, 2x/day-Food, Shelbie Hutching, NP, 5 mg at 05/17/17 0837  .  salicylic acid-sulfur 3%-7% topical shampoo, ,  Apply Topically, Once, Gracelyn Nurse, MD, Stopped at 05/15/17 1700  .  sodium phosphates (FLEET) 133 mL enema, 133 mL, Rectal, Once PRN, Tedra Coupe, MD  .  SSIP insulin lispro (HUMALOG) 100 units/mL injection, 1-12 Units, Subcutaneous, 4x/day AC, Shelbie Hutching, NP, Stopped at 05/16/17 1600  .  tamsulosin (FLOMAX) capsule, 0.4 mg, Oral, Daily, Shelbie Hutching, NP, Stopped at 05/15/17 0813    Review of reports and notes reveal: complete.  BMP  (Last 24 hours)    Date/Time Na K Cl CO2 BUN CREAT Calcium Glucose    05/17/17 0443 128 (L)    4.3    98    21    17    0.60    8.5 (L)    103       05/16/17 1223 128 (L)    4.5    98    23    18     0.61    8.6 (L)    142 (H)           CBC  (Last 24 hours)    Date/Time WBC HGB HCT MCV PLATELETS    05/17/17 0443 3.7    8.5 (L)    26.1 (L)    100.4 (H)    192       05/17/17 0443 3.7    -- -- -- --      WBC/Diff  (Last 24 hours)    Date/Time WBC Band PMN Lymp Mono Eos Baso Prom Myelo Meta Blasts    05/17/17 0443 3.7    -- 73    16    11    0    1    -- -- -- --    05/17/17 0443 3.7    12 (H)    67    8 (L)    7    -- 2    -- 3    1    --          Results for orders placed or performed during the hospital encounter of 05/14/17 (from the past 72 hour(s))   XR CHEST PA AND LATERAL     Status: None    Narrative    Nkosi D Beehler    PROCEDURE DESCRIPTION: XR CHEST PA AND LATERAL    PROCEDURE PERFORMED DATE AND TIME: 05/14/2017 3:33 PM    CLINICAL INDICATION: syncope    TECHNIQUE: 2 views / 2 images submitted.    COMPARISON: No prior studies were compared.      FINDINGS: No evidence of pneumonia, pneumothorax. No pulmonary edema. Trace  bilateral pleural effusions. Cardiac mediastinal silhouette is within  normal limits. Right IJ chest port with tip terminating in expected  location of the central SVC.      Impression    Trace bilateral pleural effusions.        Radiologist location ID: [id number]       No results found for this visit on 05/14/17 (from the past 720 hour(s)).  Recent Results (from the past 720 hour(s))   XR CHEST PA AND LATERAL    Collection Time: 05/14/17  3:33 PM    Narrative    Louann Liv    PROCEDURE DESCRIPTION: XR CHEST PA AND LATERAL    PROCEDURE PERFORMED DATE AND TIME: 05/14/2017 3:33 PM    CLINICAL INDICATION: syncope    TECHNIQUE: 2 views / 2 images submitted.    COMPARISON: No prior studies were compared.  FINDINGS: No evidence of pneumonia, pneumothorax. No pulmonary edema. Trace  bilateral pleural effusions. Cardiac mediastinal silhouette is within  normal limits. Right IJ chest port with tip terminating in expected  location of the central SVC.      Impression    Trace bilateral  pleural effusions.        Radiologist location ID: [id number]         Ledora Bottcher, APRN,NP-C  05/17/2017, 10:00

## 2017-05-17 NOTE — Consults (Signed)
[facility]  Cardiology   Progress Note      Joe Carroll, Joe Carroll  Date of Admission:  05/14/2017  Date of service: 05/17/2017  Date of Birth:  1940-11-12  MRN:  [id number]  Hospital Day:  LOS: 3 days     Subjective:     Patient has no symptoms he slept well he is very happy .Marland Kitchen  He wants the name of the pill [place] went Administration minute CAD this medication era so that he can get from [facility].  I told     Objective:     Vital Signs:  Filed Vitals:    05/16/17 1500 05/16/17 2041 05/17/17 0000 05/17/17 0339   BP: 140/81 (!) 165/91 (!) 157/75 (!) 151/69   Pulse: (!) 104 72 95 (!) 101   Resp: 19 18 18 18    Temp: 36.7 C (98.1 F) 36.7 C (98.1 F) 36.7 C (98.1 F) 36.9 C (98.4 F)   SpO2:  98% 98% 99%       Physical Exam:    General: Patient not in any acute distress.   HEENT: No JVD, No carotid bruit b/l  Lungs: Clear to auscultation and percussion.  Cardiovascular: Heart sounds are regular, systolic murmur.  [place]  Extremities: No edema, good peripheral pulses.  Neurology: Alert, awake and oriented x3     Telemetry reviewed:  Normal sinus rhythm 70-80    Labs:  No results found for: CHOLESTEROL, HDLCHOL, LDLCHOL, LDLCHOLDIR, TRIG  CBC Results   Recent Labs     05/14/17  1523 05/15/17  0405 05/16/17  0443   WBC 4.7  4.7 4.1  4.1 3.8  3.8   HGB 10.1* 8.9* 9.1*   HCT 30.5* 27.1* 28.4*   PLTCNT 231 203 213   BANDS  --  10* 4      BMP Results   Recent Labs     05/15/17  1826 05/16/17  0201 05/16/17  0443 05/16/17  1223   SODIUM 126* 128* 127* 128*   POTASSIUM 4.6 4.5 4.6 4.5   CHLORIDE 94* 95* 95* 98   CO2 22 23 21 23    BUN 19 17 17 18    CREATININE 0.71 0.61 0.63 0.61   GFR >60 >60 >60 >60   ANIONGAP 10 10 11 7      Recent Labs     05/15/17  0405  05/15/17  1826 05/16/17  0201 05/16/17  0443 05/16/17  1223   CALCIUM 8.4*   < > 8.5* 8.7* 8.8 8.6*   ALBUMIN  --   --   --   --   --  2.8*   MAGNESIUM 1.7*  --   --   --  1.8  --     < > = values in this interval not displayed.      Coag Results   Recent  Labs     05/14/17  1523 05/15/17  0404   INR 1.18* 1.17*   APTT 27.7  --       Cardiac Results      Recent Labs     05/14/17  1523 05/14/17  2323 05/15/17  0405   UHCEASTTROPI 0.02 0.02 0.02   BNP 68  --   --         Imaging:  XR CHEST PA AND LATERAL   Final Result   Trace bilateral pleural effusions.            Radiologist location ID: [id number]  Current Medications:    Current Facility-Administered Medications:  acetaminophen (TYLENOL) tablet 650 mg Oral Q6H PRN   amiodarone (CORDARONE) tablet 400 mg Oral 2x/day   aspirin chewable tablet 81 mg 81 mg Oral Daily   dextrose 50% (0.5 g/mL) injection - syringe 25 g Intravenous Q15 Min PRN   docusate sodium (COLACE) capsule 100 mg Oral 2x/day   enoxaparin PF (LOVENOX) 40 mg/0.4 mL SubQ injection 40 mg Subcutaneous Q24H   sitaGLIPtin (JANUVIA) tablet 50 mg Oral 2x/day-Food   And      metFORMIN (GLUCOPHAGE) tablet 500 mg Oral 2x/day-Food   metoprolol tartrate (LOPRESSOR) tablet 12.5 mg Oral 2x/day   NS flush syringe 3 mL Intracatheter Q8HRS   NS flush syringe 3 mL Intracatheter Q1H PRN   NS premix infusion  Intravenous Continuous   ondansetron (ZOFRAN) 2 mg/mL injection 4 mg Intravenous Q8H PRN   predniSONE (DELTASONE) tablet 5 mg Oral 2x/day-Food   salicylic acid-sulfur 3%-7% topical shampoo  Apply Topically Once   sodium phosphates (FLEET) 133 mL enema 133 mL Rectal Once PRN   SSIP insulin lispro (HUMALOG) 100 units/mL injection 1-12 Units Subcutaneous 4x/day AC   tamsulosin (FLOMAX) capsule 0.4 mg Oral Daily     Allergies   Allergen Reactions   . Ciprofloxacin Hives/ Urticaria       Assessment/ Plan:    Atrial fibrillation, paroxysmal patient is back in normal sinus rhythm .  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

## 2017-05-17 NOTE — Care Plan (Signed)
Pt is resting in bed. BP (!) 151/69   Pulse (!) 101   Temp 36.9 C (98.4 F)   Resp 18   Ht 1.74 m (5' 8.5")   Wt 82.8 kg (182 lb 9.6 oz)   SpO2 99%   BMI 27.36 kg/m . Pain assessed to maintain optimal pain control. S/s of syncope assessed. Frequently used items kept within reach to reduce fall risk. Ignatius Specking, RN    Problem: Adult Inpatient Plan of Care  Goal: Plan of Care Review  Outcome: Ongoing (see interventions/notes)  Goal: Patient-Specific Goal (Individualization)  Outcome: Ongoing (see interventions/notes)  Goal: Absence of Hospital-Acquired Illness or Injury  Outcome: Ongoing (see interventions/notes)  Goal: Optimal Comfort and Wellbeing  Outcome: Ongoing (see interventions/notes)  Goal: Rounds/Family Conference  Outcome: Ongoing (see interventions/notes)     Problem: Syncope  Goal: Absence of Syncopal Symptoms  Outcome: Ongoing (see interventions/notes)     Problem: Fall Injury Risk  Goal: Absence of Fall and Fall-Related Injury  Outcome: Ongoing (see interventions/notes)     Problem: Pain Acute  Goal: Optimal Pain Control  Outcome: Ongoing (see interventions/notes)     Problem: Discharge Needs Assessment  Goal: Discharge Needs Assessment  Outcome: Ongoing (see interventions/notes)

## 2017-05-17 NOTE — Progress Notes (Signed)
[facility]  HOSPITALIST PROGRESS NOTE      Joe Carroll, Joe Carroll, 77 y.o. male  Date of Admission:  05/14/2017  Date of service: 05/17/2017  Date of Birth:  11-Feb-1941    Assessment/Plan:    Syncope  -stable  -likely secondary to orthostatic hypotension and paroxysmal atrial fibrillation, orthostatics grossly positive on admission   -metoprolol 12.5 mg p.o. B.i.d., tolerating   -Cardiology and neurology   -high fall risk precautions    Hyponatremia  -stable, 128 today  -slowly improving with saline  -continue to monitor  -continue NS    Paroxysmal atrial fibrillation  -HR improving, in NSR today   -continue metoprolol 12.5 mg p.o. B.i.d., s/p IV digoxin 250 mcg once per cardiology   -patient started on amiodarone 400 milligrams b.i.d. On 05/16/17 which he is thus far tolerating   -monitor on telemetry   -continue aspirin    Urinary retention/BPH/prostate cancer  -maintain indwelling Foley catheter  -Flomax 0.4 mg p.o. daily  -continue follow-up with primary Urology as scheduled    Diabetes mellitus type 2  -continue baseline Janumet  -place on sliding scale insulin coverage per protocol  -diabetic diet      Subjective   No acute events overnight. No nausea or vomiting. Had a large BM after lactulose. No chest pain or palpitations. No lightheadedness or dizziness. Hasn't been up and moving much thus far.     Current Medications:    Current Facility-Administered Medications:  acetaminophen (TYLENOL) tablet 650 mg Oral Q6H PRN   amiodarone (CORDARONE) tablet 400 mg Oral 2x/day   aspirin chewable tablet 81 mg 81 mg Oral Daily   dextrose 50% (0.5 g/mL) injection - syringe 25 g Intravenous Q15 Min PRN   docusate sodium (COLACE) capsule 100 mg Oral 2x/day   enoxaparin PF (LOVENOX) 40 mg/0.4 mL SubQ injection 40 mg Subcutaneous Q24H   sitaGLIPtin (JANUVIA) tablet 50 mg Oral 2x/day-Food   And      metFORMIN (GLUCOPHAGE) tablet 500 mg Oral 2x/day-Food   metoprolol tartrate (LOPRESSOR) tablet 12.5 mg Oral 2x/day   NS flush  syringe 3 mL Intracatheter Q8HRS   NS flush syringe 3 mL Intracatheter Q1H PRN   NS premix infusion  Intravenous Continuous   ondansetron (ZOFRAN) 2 mg/mL injection 4 mg Intravenous Q8H PRN   predniSONE (DELTASONE) tablet 5 mg Oral 2x/day-Food   salicylic acid-sulfur 7%-6% topical shampoo  Apply Topically Once   sodium phosphates (FLEET) 133 mL enema 133 mL Rectal Once PRN   SSIP insulin lispro (HUMALOG) 100 units/mL injection 1-12 Units Subcutaneous 4x/day AC   tamsulosin (FLOMAX) capsule 0.4 mg Oral Daily       Objective    Vital Signs:  Temperature: 36.7 C (98.1 F)  Heart Rate: 89  BP (Non-Invasive): 139/69  Respiratory Rate: 18  SpO2: 98 %  Pain Score (Numeric, Faces): 0  Liter Flow (L/Min):       Intake & Output      Intake/Output Summary (Last 24 hours) at 05/17/2017 1457  Last data filed at 05/17/2017 1400  Gross per 24 hour   Intake 2017 ml   Output 1020 ml   Net 997 ml       Today's Physical Exam  Constitutional:  No distress and alert and oriented.  Eyes:  Conjunctiva clear, Pupils equal and round.   Neck:  Supple, symmetrical, trachea midline.  Respiratory:  Clear to auscultation bilaterally, no wheezes.  Cardiovascular:  Regular rate and rythym, no murmurs.  Gastrointestinal:  Soft,  non-tender, Bowel sounds normal, non-distended.  Musculoskeletal:  Moves all 4 limbs  Integumentary:  Skin warm and dry, No rashes or lesions.  Neurologic:  CN II - XII grossly intact, No tremor.   Extremities: No BLE edema     Labs   CBC  (Last 48 hours)    Date/Time WBC HGB HCT MCV PLATELETS    05/17/17 0443 3.7    8.5 (L)    26.1 (L)    100.4 (H)    192       05/17/17 0443 3.7    -- -- -- --    05/16/17 0443 3.8    9.1 (L)    28.4 (L)    99.9 (H)    213       05/16/17 0443 3.8    -- -- -- --        BMP  (Last 48 hours)    Date/Time Na K Cl CO2 BUN CREAT Calcium Glucose    05/17/17 0443 128 (L)    4.3    98    21    17    0.60    8.5 (L)    103       05/16/17 1223 128 (L)    4.5    98    23    18    0.61    8.6 (L)    142  (H)       05/16/17 0443 127 (L)    4.6    95 (L)    21    17    0.63    8.8    164 (H)       05/16/17 0201 128 (L)    4.5    95 (L)    23    17    0.61    8.7 (L)    150 (H)       05/15/17 1826 126 (L)    4.6    94 (L)    22    19    0.71    8.5 (L)    138 (H)           [name], [place]   05/17/2017 14:57

## 2017-05-17 NOTE — Care Plan (Addendum)
Up with PT today.  While marching in place, HR reached 148 bpm; returning to 105 after short rest.  Pt. Had no c/o dizziness or feeling light headed.  No c/o pain.  No syncopal episodes.  Anxiety verbalized by pt. And observed by staff; pt. Reassured of cardiac monitoring.  Family at bedside. Dellie Catholic, RN  05/17/2017, 18:42      Problem: Adult Inpatient Plan of Care  Goal: Plan of Care Review  Outcome: Ongoing (see interventions/notes)  Goal: Patient-Specific Goal (Individualization)  Outcome: Ongoing (see interventions/notes)  Goal: Absence of Hospital-Acquired Illness or Injury  Outcome: Ongoing (see interventions/notes)  Goal: Optimal Comfort and Wellbeing  Outcome: Ongoing (see interventions/notes)  Goal: Rounds/Family Conference  Outcome: Ongoing (see interventions/notes)     Problem: Syncope  Goal: Absence of Syncopal Symptoms  Outcome: Ongoing (see interventions/notes)     Problem: Fall Injury Risk  Goal: Absence of Fall and Fall-Related Injury  Outcome: Ongoing (see interventions/notes)     Problem: Pain Acute  Goal: Optimal Pain Control  Outcome: Ongoing (see interventions/notes)     Problem: Discharge Needs Assessment  Goal: Discharge Needs Assessment  Outcome: Ongoing (see interventions/notes)     Problem: Skin Injury Risk Increased  Goal: Skin Health and Integrity  Outcome: Ongoing (see interventions/notes)     Problem: Acute Rehab Services Goal & Intervention Plan  Goal: Gait Training Goal  Description  Stand Alone Therapy Goal  Outcome: Ongoing (see interventions/notes)  Goal: Physical Therapy Goal  Description  Stand Alone Therapy Goal  Outcome: Ongoing (see interventions/notes)

## 2017-05-18 LAB — CBC WITH DIFF
BASOPHIL #: 0 x10ˆ3/uL (ref 0.00–0.20)
BASOPHIL %: 1 %
EOSINOPHIL #: 0 10*3/uL (ref 0.00–0.50)
EOSINOPHIL %: 1 %
HCT: 25.3 % — ABNORMAL LOW (ref 38.9–50.5)
HGB: 8.2 g/dL — ABNORMAL LOW (ref 13.4–17.3)
LYMPHOCYTE #: 0.5 x10ˆ3/uL — ABNORMAL LOW (ref 0.80–3.20)
LYMPHOCYTE %: 13 %
MCH: 32.9 pg (ref 27.9–33.1)
MCHC: 32.4 g/dL — ABNORMAL LOW (ref 32.8–36.0)
MCHC: 32.4 g/dL — ABNORMAL LOW (ref 32.8–36.0)
MCV: 101.5 fL — ABNORMAL HIGH (ref 82.4–95.0)
MONOCYTE #: 0.4 x10ˆ3/uL (ref 0.20–0.80)
MONOCYTE %: 11 %
MPV: 7.2 fL (ref 6.0–10.2)
NEUTROPHIL #: 2.9 x10ˆ3/uL (ref 1.60–5.50)
NEUTROPHIL %: 74 %
PLATELETS: 177 10*3/uL (ref 140–440)
RBC: 2.49 x10ˆ6/uL — ABNORMAL LOW (ref 4.40–5.68)
RDW: 21.8 % — ABNORMAL HIGH (ref 10.9–15.1)
WBC: 3.8 10*3/uL (ref 3.3–9.3)

## 2017-05-18 LAB — URINALYSIS, MACRO/MICRO
GLUCOSE: NEGATIVE mg/dL
NITRITE: NEGATIVE
PH: 5.5 (ref 5.0–7.0)
PROTEIN: 100 mg/dL — AB
SPECIFIC GRAVITY: 1.022 (ref 1.010–1.025)
SPECIFIC GRAVITY: 1.022 (ref 1.010–1.025)
UROBILINOGEN: 1 mg/dL

## 2017-05-18 LAB — MANUAL DIFFERENTIAL
BAND %: 3 % (ref 0–6)
BASOPHIL %: 1 % (ref 0–2)
BASOPHIL ABSOLUTE: 0.04 x10ˆ3/uL
EOSINOPHIL %: 1 % (ref 0–7)
EOSINOPHIL ABSOLUTE: 0.04 x10ˆ3/uL
LYMPHOCYTE %: 6 % — ABNORMAL LOW (ref 20–40)
LYMPHOCYTE ABSOLUTE: 0.23 10*3/uL
LYMPHOCYTE ABSOLUTE: 0.23 x10ˆ3/uL
METAMYELOCYTE %: 1 %
METAMYELOCYTE %: 1 %
METAMYELOCYTE ABSOLUTE: 0.04 x10ˆ3/uL
MONOCYTE %: 10 % (ref 2–11)
MONOCYTE ABSOLUTE: 0.38 x10ˆ3/uL
MYELOCYTE ABSOLUTE: 0.11 x10ˆ3/uL
NEUTROPHIL %: 75 % — ABNORMAL HIGH (ref 50–70)
NEUTROPHIL ABSOLUTE: 2.96 x10ˆ3/uL
NUCLEATED RBC MANUAL: 1
PLATELET ESTIMATE: ADEQUATE
WBC MORPHOLOGY COMMENT: NORMAL
WBC: 3.8 x10ˆ3/uL

## 2017-05-18 LAB — POC BLOOD GLUCOSE (RESULTS)
GLUCOSE, POC: 79 mg/dl (ref 70–110)
GLUCOSE, POC: 143 mg/dl — ABNORMAL HIGH (ref 70–110)
GLUCOSE, POC: 143 mg/dl — ABNORMAL HIGH (ref 70–110)
GLUCOSE, POC: 144 mg/dl — ABNORMAL HIGH (ref 70–110)
GLUCOSE, POC: 133 mg/dl — ABNORMAL HIGH (ref 70–110)

## 2017-05-18 LAB — ECG 12 LEAD - ADULT
Calculated P Axis: 43 deg
Calculated T Axis: 56 deg
EKG Severity: BORDERLINE
Heart Rate: 94 {beats}/min
I 40 Axis: -10 deg
PR Interval: 155 ms
QRS Axis: 12 deg
QRS Duration: 91 ms
QT Interval: 346 ms
QTC Calculation: 433 ms
ST Axis: 89 deg
T 40 Axis: 18 deg
T 40 Axis: 18 deg

## 2017-05-18 LAB — BASIC METABOLIC PANEL
ANION GAP: 9 mmol/L
BUN/CREA RATIO: 33
BUN: 20 mg/dL (ref 10–25)
CALCIUM: 8.2 mg/dL — ABNORMAL LOW (ref 8.8–10.3)
CHLORIDE: 99 mmol/L (ref 98–111)
CO2 TOTAL: 20 mmol/L — ABNORMAL LOW (ref 21–35)
CREATININE: 0.61 mg/dL (ref <=1.30)
ESTIMATED GFR: >60 mL/min/1.73mˆ2
GLUCOSE: 80 mg/dL (ref 70–110)
POTASSIUM: 4.5 mmol/L (ref 3.5–5.0)
SODIUM: 128 mmol/L — ABNORMAL LOW (ref 135–145)

## 2017-05-18 LAB — MAGNESIUM
MAGNESIUM: 1.7 mg/dL — ABNORMAL LOW (ref 1.8–2.3)
MAGNESIUM: 1.7 mg/dL — ABNORMAL LOW (ref 1.8–2.3)

## 2017-05-18 LAB — PHOSPHORUS: PHOSPHORUS: 3.3 mg/dL (ref 2.5–4.5)

## 2017-05-18 MED ORDER — MAGNESIUM OXIDE 400 MG (241.3 MG MAGNESIUM) TABLET
400.00 mg | ORAL_TABLET | Freq: Two times a day (BID) | ORAL | Status: DC
Start: 2017-05-18 — End: 2017-05-20
  Administered 2017-05-18 – 2017-05-20 (×5): 400 mg via ORAL
  Filled 2017-05-18 (×8): qty 1

## 2017-05-18 MED ORDER — PHENAZOPYRIDINE 100 MG TABLET
100.00 mg | ORAL_TABLET | Freq: Three times a day (TID) | ORAL | Status: DC
Start: 2017-05-18 — End: 2017-05-20
  Administered 2017-05-18 – 2017-05-20 (×6): 100 mg via ORAL
  Filled 2017-05-18 (×10): qty 1

## 2017-05-18 MED ORDER — OXYBUTYNIN CHLORIDE 5 MG TABLET
5.00 mg | ORAL_TABLET | Freq: Three times a day (TID) | ORAL | Status: DC
Start: 2017-05-18 — End: 2017-05-20
  Administered 2017-05-18 – 2017-05-20 (×5): 5 mg via ORAL
  Filled 2017-05-18 (×12): qty 1

## 2017-05-18 MED ORDER — MAGNESIUM SULFATE 500 MG/ML (50 %) INJECTION SOLUTION
2.00 g | Freq: Once | INTRAMUSCULAR | Status: AC
Start: 2017-05-18 — End: 2017-05-18
  Administered 2017-05-18: 0 g via INTRAVENOUS
  Administered 2017-05-18: 2 g via INTRAVENOUS
  Filled 2017-05-18: qty 4

## 2017-05-18 MED ADMIN — insulin lispro 100 unit/mL subcutaneous solution: SUBCUTANEOUS | @ 11:00:00

## 2017-05-18 MED ADMIN — erythromycin 5 mg/gram (0.5 %) eye ointment: @ 14:00:00 | NDC 17478007031

## 2017-05-18 NOTE — Consults (Signed)
[facility]  Cardiology   Progress Note      Joe Carroll, Joe Carroll  Date of Admission:  05/14/2017  Date of service: 05/18/2017  Date of Birth:  09/26/1940  MRN:  [id number]  Hospital Day:  LOS: 4 days     Subjective:     Patient is feeling much better.  He is worried about low blood sugars breath patient dizzy spell resolved.  Patient in fact was able to walk with no dizzy spell her drop in blood pressure    Objective:     Vital Signs:  Filed Vitals:    05/17/17 1426 05/17/17 1900 05/17/17 2300 05/18/17 0400   BP: 139/69 (!) 164/76 (!) 163/73 (!) 145/76   Pulse: 89 98 93 80   Resp: 18 18 18 18    Temp: 36.7 C (98.1 F) 36.8 C (98.2 F) 37.1 C (98.8 F) 36.5 C (97.7 F)   SpO2: 98% 100%  98%       Physical Exam:    General: Patient not in any acute distress.   HEENT: No JVD, No carotid bruit b/l  Lungs: Clear to auscultation and percussion.  Cardiovascular: Heart sounds are regular, systolic murmur.  [place]  Extremities: No edema, good peripheral pulses.  Neurology: Alert, awake and oriented x3     Telemetry reviewed:  Sinus rhythm 70-80    Labs:  No results found for: CHOLESTEROL, HDLCHOL, LDLCHOL, LDLCHOLDIR, TRIG  CBC Results   Recent Labs     05/16/17  0443 05/17/17  0443   WBC 3.8  3.8 3.7  3.7   HGB 9.1* 8.5*   HCT 28.4* 26.1*   PLTCNT 213 192   BANDS 4 12*      BMP Results   Recent Labs     05/16/17  0443 05/16/17  1223 05/17/17  0443 05/18/17  0345   SODIUM 127* 128* 128* 128*   POTASSIUM 4.6 4.5 4.3 4.5   CHLORIDE 95* 98 98 99   CO2 21 23 21  20*   BUN 17 18 17 20    CREATININE 0.63 0.61 0.60 0.61   GFR >60 >60 >60 >60   ANIONGAP 11 7 9 9      Recent Labs     05/16/17  0443 05/16/17  1223 05/17/17  0443 05/18/17  0345   CALCIUM 8.8 8.6* 8.5* 8.2*   ALBUMIN  --  2.8*  --   --    MAGNESIUM 1.8  --  1.9 1.7*      Coag Results   No results for input(s): INR, PROTHROMTME, APTT in the last 72 hours.    Invalid input(s): PTT, PT, CREACTPROTIE   Cardiac Results      No results for input(s): UHCEASTTROPI, CKMB, MBINDEX, BNP in the last  72 hours.     Imaging:  XR CHEST PA AND LATERAL   Final Result   Trace bilateral pleural effusions.            Radiologist location ID: [id number]             Current Medications:    Current Facility-Administered Medications:  acetaminophen (TYLENOL) tablet 650 mg Oral Q6H PRN   amiodarone (CORDARONE) tablet 400 mg Oral 2x/day   aspirin chewable tablet 81 mg 81 mg Oral Daily   dextrose 50% (0.5 g/mL) injection - syringe 25 g Intravenous Q15 Min PRN   docusate sodium (COLACE) capsule 100 mg Oral 2x/day   enoxaparin PF (LOVENOX) 40 mg/0.4 mL SubQ injection 40 mg Subcutaneous Q24H  sitaGLIPtin (JANUVIA) tablet 50 mg Oral 2x/day-Food   And      metFORMIN (GLUCOPHAGE) tablet 500 mg Oral 2x/day-Food   metoprolol tartrate (LOPRESSOR) tablet 12.5 mg Oral 2x/day   NS flush syringe 3 mL Intracatheter Q8HRS   NS flush syringe 3 mL Intracatheter Q1H PRN   NS premix infusion  Intravenous Continuous   ondansetron (ZOFRAN) 2 mg/mL injection 4 mg Intravenous Q8H PRN   predniSONE (DELTASONE) tablet 5 mg Oral 2x/day-Food   salicylic acid-sulfur 8%-4% topical shampoo  Apply Topically Once   sodium phosphates (FLEET) 133 mL enema 133 mL Rectal Once PRN   SSIP insulin lispro (HUMALOG) 100 units/mL injection 1-12 Units Subcutaneous 4x/day AC   tamsulosin (FLOMAX) capsule 0.4 mg Oral Daily     Allergies   Allergen Reactions   . Ciprofloxacin Hives/ Urticaria       Assessment/ Plan:    Atrial fibrillation, paroxysmal patient back in normal sinus rhythm he is on amiodarone tolerating very well he does not want anticoagulation because of anemia and also fall risk he quit on his own.  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  Again spoke to patient risks and benefits of Northera he took the name and want to check whether [employer] going to pay.  I told unlikely they have this drug.  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

## 2017-05-18 NOTE — Progress Notes (Signed)
[facility]  Neurology Progress Note      Castin, Donaghue, 77 y.o. male  Date of Admission:  05/14/2017  Date of service: 05/18/2017  Date of Birth:  May 02, 1940    Assessment/Plan:  Syncope due to hypotension and Afib  Patient didn't have a sz or a TIA.  All sxs all related to Afib.  On maximal medical Mx.  Doing well    Subjective   Denies neurological complaints today. He denies syncope or near syncope. No loss of consciousness or seizure activity. Denies dysphagia, headaches, double vision.    Review of Systems: back pain, leaking foley catheter.      Objective   Vital Signs:  Temp (24hrs) Max:37.1 C (64.6 F)      Systolic (80HOZ), YYQ:825 , Min:139 , OIB:704     Diastolic (88QBV), QXI:50, Min:63, Max:76    Temp  Avg: 36.8 C (98.2 F)  Min: 36.5 C (97.7 F)  Max: 37.1 C (98.8 F)  MAP (Non-Invasive)  Avg: 96.5 mmHG  Min: 94 mmHG  Max: 99 mmHG  Pulse  Avg: 91.2  Min: 80  Max: 101  Resp  Avg: 18  Min: 18  Max: 18  SpO2  Avg: 99.2 %  Min: 98 %  Max: 100 %  Pain Score (Numeric, Faces): 0    Today's Physical Exam:  General: In non acute distress  HEENT: Normocephalic, atraumatic, no vision changes, normal hearing, mucous membranes intact, moist.   Neck: Supple, symmetrical, trachea midline  Abdomen: SNTND  Extremities: No cyanosis or edema  Mental status: AAOx3  Language and Speech: No dysarthria or aphasia.  Cranial nerves: EOMI, PERRLA, Face symmetric, no nystagmus, tongue midline, normal hearing, normal facial sensation.  Muscle tone: normal tone.  Motor strength: 5/5 throughout.  Sensory: vibration, proprioception, pinprick and temperature are all normal.   Gait: normal gait, able  Coordination: normal finger to nose and rapid alternating hand movements, without tremor  Reflexes: 2+ throughout.    Current Facility-Administered Medications:   .  acetaminophen (TYLENOL) tablet, 650 mg, Oral, Q6H PRN, Shelbie Hutching, NP  .  amiodarone (CORDARONE) tablet, 400 mg, Oral, 2x/day, Riley Churches,  MD, 400 mg at 05/18/17 0936  .  aspirin chewable tablet 81 mg, 81 mg, Oral, Daily, Shelbie Hutching, NP, 81 mg at 05/18/17 0936  .  dextrose 50% (0.5 g/mL) injection - syringe, 25 g, Intravenous, Q15 Min PRN, Shelbie Hutching, NP  .  docusate sodium (COLACE) capsule, 100 mg, Oral, 2x/day, Azucena Fallen, DO, 100 mg at 05/18/17 0936  .  enoxaparin PF (LOVENOX) 40 mg/0.4 mL SubQ injection, 40 mg, Subcutaneous, Q24H, Shelbie Hutching, NP, Stopped at 05/14/17 2100  .  magnesium oxide (MAG-OX) tablet, 400 mg, Oral, 2x/day, Azucena Fallen, DO, 400 mg at 05/18/17 1217  .  metoprolol tartrate (LOPRESSOR) tablet, 12.5 mg, Oral, 2x/day, Azucena Fallen, DO, 12.5 mg at 05/18/17 0936  .  NS flush syringe, 3 mL, Intracatheter, Q8HRS, Shelbie Hutching, NP, Stopped at 05/16/17 1400  .  NS flush syringe, 3 mL, Intracatheter, Q1H PRN, Shelbie Hutching, NP  .  NS premix infusion, , Intravenous, Continuous, Shelbie Hutching, NP, Last Rate: 50 mL/hr at 05/17/17 2115  .  ondansetron (ZOFRAN) 2 mg/mL injection, 4 mg, Intravenous, Q8H PRN, Azucena Fallen, DO, 4 mg at 05/18/17 1126  .  predniSONE (DELTASONE) tablet, 5 mg, Oral, 2x/day-Food, Shelbie Hutching, NP, 5 mg at 05/18/17 0936  .  salicylic acid-sulfur 4%-6% topical shampoo, , Apply Topically, Once, Gracelyn Nurse, MD, Stopped at 05/15/17 1700  .  sodium phosphates (FLEET) 133 mL enema, 133 mL, Rectal, Once PRN, Tedra Coupe, MD  .  SSIP insulin lispro (HUMALOG) 100 units/mL injection, 1-12 Units, Subcutaneous, 4x/day AC, Shelbie Hutching, NP, Stopped at 05/16/17 1600    Review of reports and notes reveal: complete.  BMP  (Last 24 hours)    Date/Time Na K Cl CO2 BUN CREAT Calcium Glucose    05/18/17 0345 128 (L)    4.5    99    20 (L)    20    0.61    8.2 (L)    80           CBC  (Last 24 hours)    Date/Time WBC HGB HCT MCV PLATELETS    05/18/17 0345 3.8    8.2 (L)    25.3 (L)    101.5 (H)    177       05/18/17 0345 3.8    -- --  -- --      WBC/Diff  (Last 24 hours)    Date/Time WBC Band PMN Lymp Mono Eos Baso Prom Myelo Meta Blasts    05/18/17 0345 3.8    -- 74    13    11    1    1     -- -- -- --    05/18/17 0345 3.8    3    75 (H)    6 (L)    10    1    1     -- 3    1    --             No results found for this visit on 05/14/17 (from the past 720 hour(s)).  Recent Results (from the past 720 hour(s))   XR CHEST PA AND LATERAL    Collection Time: 05/14/17  3:33 PM    Narrative    Louann Liv    PROCEDURE DESCRIPTION: XR CHEST PA AND LATERAL    PROCEDURE PERFORMED DATE AND TIME: 05/14/2017 3:33 PM    CLINICAL INDICATION: syncope    TECHNIQUE: 2 views / 2 images submitted.    COMPARISON: No prior studies were compared.      FINDINGS: No evidence of pneumonia, pneumothorax. No pulmonary edema. Trace  bilateral pleural effusions. Cardiac mediastinal silhouette is within  normal limits. Right IJ chest port with tip terminating in expected  location of the central SVC.      Impression    Trace bilateral pleural effusions.        Radiologist location ID: [id number]         Ledora Bottcher, APRN,NP-C  05/18/2017, 14:16

## 2017-05-18 NOTE — Care Plan (Signed)
[place]  Physical Therapy Progress Note    Patient Name: Joe Carroll  Date of Birth: 1940/09/13  Height:  174 cm (5' 8.5")  Weight:  85.8 kg (189 lb 1.6 oz)  Room/Bed: 6128/A  Payor: MEDICARE / Plan: MEDICARE PART A AND B / Product Type: Medicare /     Assessment:     (P) Pt able to ambulate in hallway MI with FWW, pt limited mainly by anxiety. Will cont to follow    Discharge Needs:   Equipment Recommendation: front wheeled walker(if continuing to use at d/c)    Discharge Disposition: home    [place]   Based on current diagnosis, functional performance prior to admission, and current functional performance, this patient requires continued PT services in home in order to achieve significant functional improvements in these deficit areas:  .      Plan:   Continue to follow patient according to established plan of care.  The risks/benefits of therapy have been discussed with the patient/caregiver and he/she is in agreement with the established plan of care.     Subjective & Objective:        05/18/17 1043   Therapist Pager   PT Assigned/ Pager # [phone]   Rehab Session   Document Type therapy progress note (daily note)   Total PT Minutes: 15   Patient Effort good   Symptoms Noted During/After Treatment other (see comments)  (anxiety)   Symptoms Noted Comment Pt anxious    General Information   Patient Profile Reviewed? yes   Patient/Family/Caregiver Comments/Observations Pt agreeable to PT session however anxious about ambulating in hallway   Respiratory Status room air   Existing Precautions/Restrictions fall precautions   Mutuality/Individual Preferences   Individualized Care Needs assist x 1 with FWW   Pre Treatment Status   Pre Treatment Patient Status Patient supine in bed;Call light within reach;Telephone within reach;Sitter select activated   Support Present Pre Treatment  Family present   Communication Pre Treatment  Nurse      Communication Pre Treatment Comment regarding pt anxiety and if she could unhook his IV so he could ambulate   Cognitive Assessment/Interventions   Behavior/Mood Observations anxious;cooperative   Orientation Status oriented x 4   Attention WNL/WFL   Follows Commands WNL   Vital Signs   Pre-Treatment Heart Rate (beats/min) 118   Post-treatment Heart Rate (beats/min) 141  (following gait)   Vitals Comment monitored on telemonitor throughout session   Pain Assessment   Pain Scale: Numbers, Pretreatment 0/10 - no pain   Pain Scale: Numbers, Post-Treatment 0/10 - no pain   Bed Mobility Assessment/Treatment   Supine-Sit [place] independent   Transfer Assessment/Treatment   Sit-Stand Independence modified independence   Stand-Sit Independence modified independence   Sit-Stand-Sit, Assist Device walker, front wheeled   Transfer Comment transfers with FWW/MI   Gait Assessment/Treatment   Independence  supervision required   Assistive Device  walker, front wheeled   Distance in Feet 140   Deviations  step length decreased  (slow gait at first but becomes functional speed)   Comment pt could have ambulated farther however limited by anxiety. HR elevated to low 140s with ambulation   Post Treatment Status   Post Treatment Patient Status Patient supine in bed;Call light within reach;Telephone within reach   Support Present Post Treatment  Family present   Physical Therapy Clinical Impression   Assessment Pt able to ambulate in hallway MI with FWW,  pt limited mainly by anxiety. Will cont to follow       Therapist:   Lottie Rater, PTA   Pager #:

## 2017-05-18 NOTE — Care Plan (Addendum)
Problem: Adult Inpatient Plan of Care  Goal: Plan of Care Review  Outcome: Ongoing (see interventions/notes)  Goal: Patient-Specific Goal (Individualization)  Outcome: Ongoing (see interventions/notes)  Goal: Absence of Hospital-Acquired Illness or Injury  Outcome: Ongoing (see interventions/notes)  Goal: Optimal Comfort and Wellbeing  Outcome: Ongoing (see interventions/notes)  Goal: Rounds/Family Conference  Outcome: Ongoing (see interventions/notes)     Problem: Syncope  Goal: Absence of Syncopal Symptoms  Outcome: Ongoing (see interventions/notes)     Problem: Fall Injury Risk  Goal: Absence of Fall and Fall-Related Injury  Outcome: Ongoing (see interventions/notes)     Problem: Pain Acute  Goal: Optimal Pain Control  Outcome: Ongoing (see interventions/notes)     Problem: Discharge Needs Assessment  Goal: Discharge Needs Assessment  Outcome: Ongoing (see interventions/notes)     Problem: Skin Injury Risk Increased  Goal: Skin Health and Integrity  Outcome: Ongoing (see interventions/notes)     Fall precautions maintained.  Foley draining redish/tea colored urine to gravity.  Pt still c/o bladder spasms with extreme anxiety regarding foley and foley drainage. B/P remains stable. BP (!) 175/73   Pulse 96   Temp 37.1 C (98.8 F)   Resp 18   Ht 1.74 m (5' 8.5")   Wt 85.8 kg (189 lb 1.6 oz)   SpO2 98%   BMI 28.33 kg/m     Anne Fu, RN  05/19/2017, 05:30

## 2017-05-18 NOTE — Care Plan (Signed)
Remains free of falls.  Ambulated in hall with PT today.  No syncopal episodes.  Pt. C/o bladder spasms and urinating around catheter.  Catheter observed to be secured and patent.  MD notified; orders received and administered; will continue to monitor.  No c/o pain.  Ambulated in room with wife.    Problem: Adult Inpatient Plan of Care  Goal: Plan of Care Review  Outcome: Ongoing (see interventions/notes)  Goal: Patient-Specific Goal (Individualization)  Outcome: Ongoing (see interventions/notes)  Goal: Absence of Hospital-Acquired Illness or Injury  Outcome: Ongoing (see interventions/notes)  Goal: Optimal Comfort and Wellbeing  Outcome: Ongoing (see interventions/notes)  Goal: Rounds/Family Conference  Outcome: Ongoing (see interventions/notes)     Problem: Syncope  Goal: Absence of Syncopal Symptoms  Outcome: Ongoing (see interventions/notes)     Problem: Fall Injury Risk  Goal: Absence of Fall and Fall-Related Injury  Outcome: Ongoing (see interventions/notes)     Problem: Pain Acute  Goal: Optimal Pain Control  Outcome: Ongoing (see interventions/notes)     Problem: Discharge Needs Assessment  Goal: Discharge Needs Assessment  Outcome: Ongoing (see interventions/notes)     Problem: Skin Injury Risk Increased  Goal: Skin Health and Integrity  Outcome: Ongoing (see interventions/notes)     Problem: Acute Rehab Services Goal & Intervention Plan  Goal: Gait Training Goal  Description  Stand Alone Therapy Goal  Outcome: Ongoing (see interventions/notes)  Goal: Physical Therapy Goal  Description  Stand Alone Therapy Goal  Outcome: Ongoing (see interventions/notes)

## 2017-05-18 NOTE — Progress Notes (Signed)
[facility]  HOSPITALIST PROGRESS NOTE      Joe Carroll, Joe Carroll, 77 y.o. male  Date of Admission:  05/14/2017  Date of service: 05/18/2017  Date of Birth:  29-Dec-1940    Assessment/Plan:    Syncope  -stable/improving, able to ambulate in hallways today  -likely secondary to orthostatic hypotension and paroxysmal atrial fibrillation, orthostatics grossly positive on admission   -metoprolol 12.5 mg p.o. B.i.d., tolerating   -Cardiology and neurology   -high fall risk precautions  -PT following    Hyponatremia  -stable, 128 today  -slowly improving with saline  -continue to monitor  -continue NS    Paroxysmal atrial fibrillation  -HR improving, remains in sinus rhythm  -continue metoprolol 12.5 mg p.o. B.i.d., s/p IV digoxin 250 mcg once per cardiology   -patient started on amiodarone 400 milligrams b.i.d. On 05/16/17 which he is thus far tolerating, will repeat an EKG today for QTC monitor   -monitor on telemetry   -continue aspirin    Urinary retention/BPH/prostate cancer  -maintain indwelling Foley catheter  -Flomax 0.4 mg p.o. stopped as patient is refusing  -continue follow-up with primary Urology as scheduled    Diabetes mellitus type 2  -continue baseline Janumet  -place on sliding scale insulin coverage per protocol  -diabetic diet      Subjective   Patient feels well today.  He worked with physical therapy yesterday and tolerated that well despite having some significant sinus tachycardia with heart rates in the 140s.  He denies any chest pain.  No palpitations.  No lightheadedness or dizziness.    Current Medications:    Current Facility-Administered Medications:  acetaminophen (TYLENOL) tablet 650 mg Oral Q6H PRN   amiodarone (CORDARONE) tablet 400 mg Oral 2x/day   aspirin chewable tablet 81 mg 81 mg Oral Daily   dextrose 50% (0.5 g/mL) injection - syringe 25 g Intravenous Q15 Min PRN   docusate sodium (COLACE) capsule 100 mg Oral 2x/day   enoxaparin PF (LOVENOX) 40 mg/0.4 mL SubQ injection 40 mg  Subcutaneous Q24H   magnesium oxide (MAG-OX) tablet 400 mg Oral 2x/day   metoprolol tartrate (LOPRESSOR) tablet 12.5 mg Oral 2x/day   NS flush syringe 3 mL Intracatheter Q8HRS   NS flush syringe 3 mL Intracatheter Q1H PRN   NS premix infusion  Intravenous Continuous   ondansetron (ZOFRAN) 2 mg/mL injection 4 mg Intravenous Q8H PRN   predniSONE (DELTASONE) tablet 5 mg Oral 2x/day-Food   salicylic acid-sulfur 0%-8% topical shampoo  Apply Topically Once   sodium phosphates (FLEET) 133 mL enema 133 mL Rectal Once PRN   SSIP insulin lispro (HUMALOG) 100 units/mL injection 1-12 Units Subcutaneous 4x/day AC       Objective    Vital Signs:  Temperature: 36.8 C (98.2 F)  Heart Rate: 86  BP (Non-Invasive): (!) 159/63  Respiratory Rate: 18  SpO2: 100 %  Pain Score (Numeric, Faces): 0  Liter Flow (L/Min):       Intake & Output      Intake/Output Summary (Last 24 hours) at 05/18/2017 1412  Last data filed at 05/17/2017 2200  Gross per 24 hour   Intake --   Output 100 ml   Net -100 ml       Today's Physical Exam  Constitutional:  No distress and alert and oriented.  Eyes:  Conjunctiva clear, Pupils equal and round.   Neck:  Supple, symmetrical, trachea midline.  Respiratory:  Clear to auscultation bilaterally, no wheezes.  Cardiovascular:  Regular rate and  rythym, no murmurs.  Gastrointestinal:  Soft, non-tender, Bowel sounds normal, non-distended.  Musculoskeletal:  Moves all 4 limbs  Integumentary:  Skin warm and dry, No rashes or lesions.  Neurologic:  CN II - XII grossly intact, No tremor.   Extremities: No BLE edema     Labs   CBC  (Last 48 hours)    Date/Time WBC HGB HCT MCV PLATELETS    05/18/17 0345 3.8    8.2 (L)    25.3 (L)    101.5 (H)    177       05/18/17 0345 3.8    -- -- -- --    05/17/17 0443 3.7    8.5 (L)    26.1 (L)    100.4 (H)    192       05/17/17 0443 3.7    -- -- -- --        BMP  (Last 48 hours)    Date/Time Na K Cl CO2 BUN CREAT Calcium Glucose    05/18/17 0345 128 (L)    4.5    99    20 (L)    20     0.61    8.2 (L)    80       05/17/17 0443 128 (L)    4.3    98    21    17    0.60    8.5 (L)    103           [name], [name]   05/18/2017 14:12

## 2017-05-19 LAB — POC BLOOD GLUCOSE (RESULTS)
GLUCOSE, POC: 101 mg/dl (ref 70–110)
GLUCOSE, POC: 97 mg/dl (ref 70–110)
GLUCOSE, POC: 89 mg/dl (ref 70–110)
GLUCOSE, POC: 89 mg/dl (ref 70–110)

## 2017-05-19 MED ORDER — AMIODARONE 200 MG TABLET
200.00 mg | ORAL_TABLET | Freq: Two times a day (BID) | ORAL | Status: DC
Start: 2017-05-19 — End: 2017-05-20
  Administered 2017-05-19 – 2017-05-20 (×3): 200 mg via ORAL
  Filled 2017-05-19 (×6): qty 1

## 2017-05-19 MED ORDER — CEFEPIME 2G IN NS 50ML MINIBAG IVPB
2.00 g | Freq: Two times a day (BID) | INTRAMUSCULAR | Status: DC
Start: 2017-05-19 — End: 2017-05-20
  Administered 2017-05-19: 0 g via INTRAVENOUS
  Administered 2017-05-19: 2 g via INTRAVENOUS
  Administered 2017-05-19 – 2017-05-20 (×2): 0 g via INTRAVENOUS
  Filled 2017-05-19 (×5): qty 50

## 2017-05-19 MED ORDER — DIPHENHYDRAMINE 25 MG CAPSULE
25.00 mg | ORAL_CAPSULE | Freq: Four times a day (QID) | ORAL | Status: DC | PRN
Start: 2017-05-19 — End: 2017-05-20

## 2017-05-19 MED ADMIN — sodium chloride 0.9 % intravenous solution: ORAL | @ 09:00:00 | NDC 00338004904

## 2017-05-19 NOTE — Consults (Signed)
[facility]  Cardiology   Progress Note      Thornton, Dohrmann  Date of Admission:  05/14/2017  Date of service: 05/19/2017  Date of Birth:  13-Mar-1940  MRN:  [id number]  Hospital Day:  LOS: 5 days     Subjective:     Patient having some bladder problems per spoke to the nurses breath patient has a Foley catheter for long time .  He has spasms and hematuria.  Patient is very happy that his heart is doing very well per denies any chest pain, short of breath, PND, orthopnea    Objective:     Vital Signs:  Filed Vitals:    05/18/17 1100 05/18/17 1500 05/18/17 2000 05/18/17 2300   BP: (!) 159/63 (!) 162/71 (!) 160/71 (!) 175/73   Pulse: 86 88 99 96   Resp: 18 18 18 18    Temp: 36.8 C (98.2 F) 36.7 C (98.1 F) 37.3 C (99.1 F) 37.1 C (98.8 F)   SpO2: 100% 100% 99% 98%       Physical Exam:    General: Patient not in any acute distress.   HEENT: No JVD, No carotid bruit b/l  Lungs: Clear to auscultation and percussion.  Cardiovascular: Heart sounds are regular, systolic murmur.  [place]  Extremities: No edema, good peripheral pulses.  Neurology: Alert, awake and oriented x3     Telemetry reviewed:  Sinus rhythm 70-80    Labs:  No results found for: CHOLESTEROL, HDLCHOL, LDLCHOL, LDLCHOLDIR, TRIG  CBC Results   Recent Labs     05/17/17  0443 05/18/17  0345   WBC 3.7  3.7 3.8  3.8   HGB 8.5* 8.2*   HCT 26.1* 25.3*   PLTCNT 192 177   BANDS 12* 3      BMP Results   Recent Labs     05/16/17  1223 05/17/17  0443 05/18/17  0345   SODIUM 128* 128* 128*   POTASSIUM 4.5 4.3 4.5   CHLORIDE 98 98 99   CO2 23 21 20*   BUN 18 17 20    CREATININE 0.61 0.60 0.61   GFR >60 >60 >60   ANIONGAP 7 9 9      Recent Labs     05/16/17  1223 05/17/17  0443 05/18/17  0345   CALCIUM 8.6* 8.5* 8.2*   ALBUMIN 2.8*  --   --    MAGNESIUM  --  1.9 1.7*      Coag Results   No results for input(s): INR, PROTHROMTME, APTT in the last 72 hours.    Invalid input(s): PTT, PT, CREACTPROTIE   Cardiac Results      No results for input(s): UHCEASTTROPI, CKMB, MBINDEX, BNP in the  last 72 hours.     Imaging:  XR CHEST PA AND LATERAL   Final Result   Trace bilateral pleural effusions.            Radiologist location ID: [id number]             Current Medications:    Current Facility-Administered Medications:  acetaminophen (TYLENOL) tablet 650 mg Oral Q6H PRN   amiodarone (CORDARONE) tablet 400 mg Oral 2x/day   aspirin chewable tablet 81 mg 81 mg Oral Daily   dextrose 50% (0.5 g/mL) injection - syringe 25 g Intravenous Q15 Min PRN   docusate sodium (COLACE) capsule 100 mg Oral 2x/day   enoxaparin PF (LOVENOX) 40 mg/0.4 mL SubQ injection 40 mg Subcutaneous Q24H   magnesium oxide (MAG-OX) tablet  400 mg Oral 2x/day   metoprolol tartrate (LOPRESSOR) tablet 12.5 mg Oral 2x/day   NS flush syringe 3 mL Intracatheter Q8HRS   NS flush syringe 3 mL Intracatheter Q1H PRN   NS premix infusion  Intravenous Continuous   ondansetron (ZOFRAN) 2 mg/mL injection 4 mg Intravenous Q8H PRN   oxybutynin (DITROPAN) tablet 5 mg Oral 3x/day   phenazopyridine (PYRIDIUM) tablet 100 mg Oral 3x/day-Meals   predniSONE (DELTASONE) tablet 5 mg Oral 2x/day-Food   salicylic acid-sulfur 5%-7% topical shampoo  Apply Topically Once   sodium phosphates (FLEET) 133 mL enema 133 mL Rectal Once PRN   SSIP insulin lispro (HUMALOG) 100 units/mL injection 1-12 Units Subcutaneous 4x/day AC     Allergies   Allergen Reactions   . Ciprofloxacin Hives/ Urticaria       Assessment/ Plan:    Atrial fibrillation, paroxysmal patient back in normal sinus rhythm he is on amiodarone tolerating   Plan amiodarone to 200 p.o. B.i.d. 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  Patient having significant bladder problem spasms and hematuria  Plan consider urology consult    Riley Churches, MD

## 2017-05-19 NOTE — Progress Notes (Signed)
[facility]  HOSPITALIST PROGRESS NOTE      Joe Carroll, Joe Carroll, 77 y.o. male  Date of Admission:  05/14/2017  Date of service: 05/19/2017  Date of Birth:  1941/01/20    Assessment/Plan:    Syncope  -stable/improving, continues to do well  -likely secondary to orthostatic hypotension and paroxysmal atrial fibrillation, orthostatics grossly positive on admission   -metoprolol 12.5 mg p.o. B.i.d., tolerating   -Cardiology and neurology   -high fall risk precautions  -PT following    Hyponatremia  -stable  -continue to monitor  -DC NS    Paroxysmal atrial fibrillation  -HR improving, remains in sinus rhythm  -continue metoprolol 12.5 mg p.o. B.i.d., s/p IV digoxin 250 mcg once per cardiology   -decreased amiodarone 200 milligrams b.i.d. Per Cardiology.  -monitor on telemetry   -continue aspirin    Urinary retention/BPH/prostate cancer  -maintain indwelling Foley catheter  -Flomax 0.4 mg p.o. stopped as patient is refusing  -continue follow-up with primary Urology as scheduled  -oxybutynin started yesterday for possible bladder spasms    Possible UTI  -UA consistent with UTI  -start cefepime  -follow culture    Diabetes mellitus type 2  -continue baseline Janumet  -place on sliding scale insulin coverage per protocol  -diabetic diet      Subjective   Patient with no acute events overnight.  Yesterday evening he did complain of bladder spasms.  He has had some leaking around his catheter.  He denies any lightheadedness or dizziness.  His appetite is decent.  No fevers or chills.  No nausea or vomiting.    Current Medications:    Current Facility-Administered Medications:  acetaminophen (TYLENOL) tablet 650 mg Oral Q6H PRN   amiodarone (CORDARONE) tablet 200 mg Oral 2x/day   aspirin chewable tablet 81 mg 81 mg Oral Daily   ceFEPime (MAXIPIME) 2g in NS 41mL IVPB minibag 2 g Intravenous Q12H   dextrose 50% (0.5 g/mL) injection - syringe 25 g Intravenous Q15 Min PRN   docusate sodium (COLACE) capsule 100 mg Oral 2x/day    enoxaparin PF (LOVENOX) 40 mg/0.4 mL SubQ injection 40 mg Subcutaneous Q24H   magnesium oxide (MAG-OX) tablet 400 mg Oral 2x/day   metoprolol tartrate (LOPRESSOR) tablet 12.5 mg Oral 2x/day   NS flush syringe 3 mL Intracatheter Q8HRS   NS flush syringe 3 mL Intracatheter Q1H PRN   NS premix infusion  Intravenous Continuous   ondansetron (ZOFRAN) 2 mg/mL injection 4 mg Intravenous Q8H PRN   oxybutynin (DITROPAN) tablet 5 mg Oral 3x/day   phenazopyridine (PYRIDIUM) tablet 100 mg Oral 3x/day-Meals   predniSONE (DELTASONE) tablet 5 mg Oral 2x/day-Food   salicylic acid-sulfur 2%-7% topical shampoo  Apply Topically Once   sodium phosphates (FLEET) 133 mL enema 133 mL Rectal Once PRN   SSIP insulin lispro (HUMALOG) 100 units/mL injection 1-12 Units Subcutaneous 4x/day AC       Objective    Vital Signs:  Temperature: 37.2 C (99 F)  Heart Rate: 91  BP (Non-Invasive): (!) 164/76  Respiratory Rate: 18  SpO2: 98 %  Pain Score (Numeric, Faces): 0  Liter Flow (L/Min):       Intake & Output      Intake/Output Summary (Last 24 hours) at 05/19/2017 1601  Last data filed at 05/19/2017 1300  Gross per 24 hour   Intake 530 ml   Output 500 ml   Net 30 ml       Today's Physical Exam  Constitutional:  No distress  and alert and oriented.  Eyes:  Conjunctiva clear, Pupils equal and round.   Neck:  Supple, symmetrical, trachea midline.  Respiratory:  Clear to auscultation bilaterally, no wheezes.  Cardiovascular:  Regular rate and rythym, no murmurs.  Gastrointestinal:  Soft, non-tender, Bowel sounds normal, non-distended.  Musculoskeletal:  Moves all 4 limbs  Integumentary:  Skin warm and dry, No rashes or lesions.  Neurologic:  CN II - XII grossly intact, No tremor.   Extremities: No BLE edema     Labs   CBC  (Last 48 hours)    Date/Time WBC HGB HCT MCV PLATELETS    05/18/17 0345 3.8    8.2 (L)    25.3 (L)    101.5 (H)    177       05/18/17 0345 3.8    -- -- -- --        BMP  (Last 48 hours)    Date/Time Na K Cl CO2 BUN CREAT Calcium  Glucose    05/18/17 0345 128 (L)    4.5    99    20 (L)    20    0.61    8.2 (L)    80           Gwyneth Fernandez L. [place], [place] Hospitalist   05/19/2017 16:01

## 2017-05-19 NOTE — Care Plan (Signed)
Remains free of syncopal episodes during shift.  Bladder spasms have lessened in severity and frequency per pt. Statement.  No further urine leaking at foley insertion site. Beg changed per pt. Request.  No c/o pain at this time.  Normal sinus rhythm.  Pt. Questioned having a reaction to IV antibiotic, stated "the air feels hot in my nose"; vitals remained stable, no rash.  Spoke with MD and obtained order for benadryl PRN. Dellie Catholic, RN  05/19/2017, 18:52      Problem: Adult Inpatient Plan of Care  Goal: Plan of Care Review  Outcome: Ongoing (see interventions/notes)  Goal: Patient-Specific Goal (Individualization)  Outcome: Ongoing (see interventions/notes)  Goal: Absence of Hospital-Acquired Illness or Injury  Outcome: Ongoing (see interventions/notes)  Goal: Optimal Comfort and Wellbeing  Outcome: Ongoing (see interventions/notes)  Goal: Rounds/Family Conference  Outcome: Ongoing (see interventions/notes)     Problem: Syncope  Goal: Absence of Syncopal Symptoms  Outcome: Ongoing (see interventions/notes)     Problem: Fall Injury Risk  Goal: Absence of Fall and Fall-Related Injury  Outcome: Ongoing (see interventions/notes)     Problem: Pain Acute  Goal: Optimal Pain Control  Outcome: Ongoing (see interventions/notes)     Problem: Discharge Needs Assessment  Goal: Discharge Needs Assessment  Outcome: Ongoing (see interventions/notes)     Problem: Skin Injury Risk Increased  Goal: Skin Health and Integrity  Outcome: Ongoing (see interventions/notes)     Problem: Acute Rehab Services Goal & Intervention Plan  Goal: Gait Training Goal  Description  Stand Alone Therapy Goal  Outcome: Ongoing (see interventions/notes)  Goal: Physical Therapy Goal  Description  Stand Alone Therapy Goal  Outcome: Ongoing (see interventions/notes)

## 2017-05-19 NOTE — Progress Notes (Signed)
[facility]  Neurology Progress Note      Joe Carroll, Corne, 77 y.o. male  Date of Admission:  05/14/2017  Date of service: 05/19/2017  Date of Birth:  08-May-1940    Assessment/Plan:  Syncope due to hypotension and Afib  Patient didn't have a sz or a TIA.  All sxs all related to Afib.  On maximal medical Mx.  Doing well    Subjective   Continues to do well. No new neurological complaints.     He deniessyncope or near syncope. No loss of consciousness or seizure activity. Denies dysphagia, headaches, double vision.    Review of Systems: back pain, bladder spasms    Objective   Vital Signs:  Temp (24hrs) Max:37.4 C (37.1 F)      Systolic (06YIR), SWN:462 , Min:155 , VOJ:500     Diastolic (93GHW), EXH:37, Min:67, Max:78    Temp  Avg: 37.1 C (98.8 F)  Min: 36.8 C (98.2 F)  Max: 37.4 C (99.3 F)  MAP (Non-Invasive)  Avg: 98 mmHG  Min: 98 mmHG  Max: 98 mmHG  Pulse  Avg: 93.2  Min: 83  Max: 99  Resp  Avg: 18.2  Min: 18  Max: 19  SpO2  Avg: 98.3 %  Min: 98 %  Max: 99 %  Pain Score (Numeric, Faces): 0    Today's Physical Exam:  General: In non acute distress  HEENT: Normocephalic, atraumatic, no vision changes, normal hearing, mucous membranes intact, moist.   Neck: Supple, symmetrical, trachea midline  Abdomen: SNTND  Extremities: No cyanosis or edema  Mental status: AAOx3  Language and Speech: No dysarthria or aphasia.  Cranial nerves: EOMI, PERRLA, Face symmetric, no nystagmus, tongue midline, normal hearing, normal facial sensation.  Muscle tone: normal tone.  Motor strength: 5/5 throughout.  Sensory: vibration, proprioception, pinprick and temperature are all normal.   Gait: stable with walker   Coordination: normal finger to nose and rapid alternating hand movements, without tremor  Reflexes: 2+ throughout.    Current Facility-Administered Medications:   .  acetaminophen (TYLENOL) tablet, 650 mg, Oral, Q6H PRN, Shelbie Hutching, NP  .  amiodarone (CORDARONE) tablet, 200 mg, Oral, 2x/day, Riley Churches, MD, 200 mg at 05/19/17 0849  .  aspirin chewable tablet 81 mg, 81 mg, Oral, Daily, Shelbie Hutching, NP, 81 mg at 05/19/17 0848  .  ceFEPime (MAXIPIME) 2g in NS 79mL IVPB minibag, 2 g, Intravenous, Q12H, Azucena Fallen, DO, Stopped at 05/19/17 1424  .  dextrose 50% (0.5 g/mL) injection - syringe, 25 g, Intravenous, Q15 Min PRN, Shelbie Hutching, NP  .  docusate sodium (COLACE) capsule, 100 mg, Oral, 2x/day, Azucena Fallen, DO, 100 mg at 05/19/17 0848  .  enoxaparin PF (LOVENOX) 40 mg/0.4 mL SubQ injection, 40 mg, Subcutaneous, Q24H, Shelbie Hutching, NP, Stopped at 05/14/17 2100  .  magnesium oxide (MAG-OX) tablet, 400 mg, Oral, 2x/day, Azucena Fallen, DO, 400 mg at 05/19/17 0848  .  metoprolol tartrate (LOPRESSOR) tablet, 12.5 mg, Oral, 2x/day, Azucena Fallen, DO, 12.5 mg at 05/19/17 0847  .  NS flush syringe, 3 mL, Intracatheter, Q8HRS, Shelbie Hutching, NP, Stopped at 05/16/17 1400  .  NS flush syringe, 3 mL, Intracatheter, Q1H PRN, Shelbie Hutching, NP  .  ondansetron [facility]) 2 mg/mL injection, 4 mg, Intravenous, Q8H PRN, Azucena Fallen, DO, 4 mg at 05/18/17 1126  .  oxybutynin (DITROPAN) tablet, 5 mg, Oral, 3x/day, Azucena Fallen, DO,  5 mg at 05/19/17 1354  .  phenazopyridine (PYRIDIUM) tablet, 100 mg, Oral, 3x/day-Meals, Azucena Fallen, DO, 100 mg at 05/19/17 1718  .  predniSONE (DELTASONE) tablet, 5 mg, Oral, 2x/day-Food, Shelbie Hutching, NP, 5 mg at 57/89/78 4784  .  salicylic acid-sulfur 1%-2% topical shampoo, , Apply Topically, Once, Gracelyn Nurse, MD, Stopped at 05/15/17 1700  .  sodium phosphates (FLEET) 133 mL enema, 133 mL, Rectal, Once PRN, Tedra Coupe, MD  .  SSIP insulin lispro (HUMALOG) 100 units/mL injection, 1-12 Units, Subcutaneous, 4x/day AC, Shelbie Hutching, NP, Stopped at 05/16/17 1600    Review of reports and notes reveal: complete.           No results found for this visit on 05/14/17 (from the past 720  hour(s)).  Recent Results (from the past 720 hour(s))   XR CHEST PA AND LATERAL    Collection Time: 05/14/17  3:33 PM    Narrative    Louann Liv    PROCEDURE DESCRIPTION: XR CHEST PA AND LATERAL    PROCEDURE PERFORMED DATE AND TIME: 05/14/2017 3:33 PM    CLINICAL INDICATION: syncope    TECHNIQUE: 2 views / 2 images submitted.    COMPARISON: No prior studies were compared.      FINDINGS: No evidence of pneumonia, pneumothorax. No pulmonary edema. Trace  bilateral pleural effusions. Cardiac mediastinal silhouette is within  normal limits. Right IJ chest port with tip terminating in expected  location of the central SVC.      Impression    Trace bilateral pleural effusions.        Radiologist location ID: [id number]         Ledora Bottcher, APRN,NP-C  05/19/2017, 17:40

## 2017-05-20 DIAGNOSIS — C7951 Secondary malignant neoplasm of bone: Secondary | ICD-10-CM

## 2017-05-20 LAB — POC BLOOD GLUCOSE (RESULTS)
GLUCOSE, POC: 85 mg/dl (ref 70–110)
GLUCOSE, POC: 112 mg/dl — ABNORMAL HIGH (ref 70–110)

## 2017-05-20 MED ORDER — AMIODARONE 200 MG TABLET
200.00 mg | ORAL_TABLET | Freq: Two times a day (BID) | ORAL | 0 refills | Status: AC
Start: 2017-05-20 — End: 2017-06-19

## 2017-05-20 MED ORDER — PHENAZOPYRIDINE 100 MG TABLET
100.00 mg | ORAL_TABLET | Freq: Three times a day (TID) | ORAL | 0 refills | Status: AC
Start: 2017-05-20 — End: 2017-06-19

## 2017-05-20 MED ORDER — OXYBUTYNIN CHLORIDE 5 MG TABLET
5.00 mg | ORAL_TABLET | Freq: Three times a day (TID) | ORAL | 0 refills | Status: AC
Start: 2017-05-20 — End: 2017-06-19

## 2017-05-20 MED ORDER — SULFAMETHOXAZOLE 800 MG-TRIMETHOPRIM 160 MG TABLET
1.00 | ORAL_TABLET | Freq: Two times a day (BID) | ORAL | 0 refills | Status: DC
Start: 2017-05-20 — End: 2017-05-23

## 2017-05-20 MED ORDER — METOPROLOL TARTRATE 25 MG TABLET
12.50 mg | ORAL_TABLET | Freq: Two times a day (BID) | ORAL | Status: AC
Start: 2017-05-20 — End: ?

## 2017-05-20 MED ADMIN — sodium chloride 0.9 % intravenous solution: ORAL | @ 09:00:00 | NDC 00338004904

## 2017-05-20 MED ADMIN — methylPREDNISolone sodium succinate 40 mg solution for injection: @ 06:00:00

## 2017-05-20 NOTE — Nurses Notes (Signed)
Pt alert and oriented. No complaints of pain. Continues with foley catheter, patent and draining, bloody/amber urine. On room air. Remains free from fall, will continue to monitor. Elder Love, LPN

## 2017-05-20 NOTE — Care Plan (Signed)
Problem: Adult Inpatient Plan of Care  Goal: Plan of Care Review  Outcome: Ongoing (see interventions/notes)  Goal: Patient-Specific Goal (Individualization)  Outcome: Ongoing (see interventions/notes)  Goal: Absence of Hospital-Acquired Illness or Injury  Outcome: Ongoing (see interventions/notes)  Goal: Optimal Comfort and Wellbeing  Outcome: Ongoing (see interventions/notes)  Goal: Rounds/Family Conference  Outcome: Ongoing (see interventions/notes)     Problem: Syncope  Goal: Absence of Syncopal Symptoms  Outcome: Ongoing (see interventions/notes)  Intervention: Manage Effect of Syncopal Symptoms  Flowsheets  Taken 05/19/2017 2000 by Elder Love, LPN  Syncope Management: position changed slowly  Taken 05/20/2017 1036 by Lenon Ahmadi, RN  Supportive Measures: active listening utilized;self-care encouraged     Problem: Fall Injury Risk  Goal: Absence of Fall and Fall-Related Injury  Outcome: Ongoing (see interventions/notes)  Intervention: Identify and Manage Contributors to Fall Injury Risk  Flowsheets (Taken 05/20/2017 1036)  Self-Care Promotion: independence encouraged;BADL personal objects within reach  Medication Review/Management: medications reviewed;high risk medications identified  Intervention: [place] (Taken 05/20/2017 [phone])  Safety Promotion/Fall Prevention: safety round/check completed;nonskid shoes/slippers when out of bed  Environmental Safety Modification: assistive device/personal items within reach;clutter free environment maintained     Problem: Pain Acute  Goal: Optimal Pain Control  Outcome: Ongoing (see interventions/notes)  Intervention: Develop Pain Management Plan  Flowsheets (Taken 05/20/2017 1036)  Pain Intervention : Medication (see eMAR);PRN Medication  Sensory Stimulation Regulation: auditory stimulation minimized  Intervention: Prevent or Manage Pain  Flowsheets  Taken 05/19/2017 2000 by Claris Gladden, LPN  Sleep/Rest Enhancement: awakenings  minimized  Taken 05/20/2017 1036 by Lenon Ahmadi, RN  Sensory Stimulation Regulation: auditory stimulation minimized     Problem: Discharge Needs Assessment  Goal: Discharge Needs Assessment  Outcome: Ongoing (see interventions/notes)     Problem: Skin Injury Risk Increased  Goal: Skin Health and Integrity  Outcome: Ongoing (see interventions/notes)  Intervention: Optimize Skin Protection  Flowsheets  Taken 05/16/2017 0835 by Cy Blamer, RN  Pressure Reduction Techniques: frequent weight shift encouraged  Taken 05/20/2017 0839 by Lenon Ahmadi, RN  Skin Protection: adhesive use limited  Taken 05/19/2017 2000 by Claris Gladden, LPN  Head of Bed [facility]): HOB at 30-45 degrees     Problem: Acute Rehab Services Goal & Intervention Plan  Goal: Gait Training Goal  Description  Stand Alone Therapy Goal  Outcome: Ongoing (see interventions/notes)  Goal: Physical Therapy Goal  Description  Stand Alone Therapy Goal  Outcome: Ongoing (see interventions/notes)    Plan of care reviewed with patient

## 2017-05-20 NOTE — Care Plan (Signed)
Problem: Adult Inpatient Plan of Care  Goal: Plan of Care Review  Outcome: Ongoing (see interventions/notes)  Goal: Patient-Specific Goal (Individualization)  Outcome: Ongoing (see interventions/notes)  Goal: Absence of Hospital-Acquired Illness or Injury  Outcome: Ongoing (see interventions/notes)  Goal: Optimal Comfort and Wellbeing  Outcome: Ongoing (see interventions/notes)  Goal: Rounds/Family Conference  Outcome: Ongoing (see interventions/notes)     Problem: Syncope  Goal: Absence of Syncopal Symptoms  Outcome: Ongoing (see interventions/notes)     Problem: Fall Injury Risk  Goal: Absence of Fall and Fall-Related Injury  Outcome: Ongoing (see interventions/notes)     Problem: Pain Acute  Goal: Optimal Pain Control  Outcome: Ongoing (see interventions/notes)     Problem: Discharge Needs Assessment  Goal: Discharge Needs Assessment  Outcome: Ongoing (see interventions/notes)     Problem: Skin Injury Risk Increased  Goal: Skin Health and Integrity  Outcome: Ongoing (see interventions/notes)     Problem: Acute Rehab Services Goal & Intervention Plan  Goal: Gait Training Goal  Description  Stand Alone Therapy Goal  Outcome: Ongoing (see interventions/notes)  Goal: Physical Therapy Goal  Description  Stand Alone Therapy Goal  Outcome: Ongoing (see interventions/notes)

## 2017-05-20 NOTE — Nurses Notes (Signed)
Patient discharged to home via private vehicle. AVS discussed with patient and all questions answered.  Copy of AVS given to patient.     Lenon Ahmadi, RN  05/20/2017, 12:07

## 2017-05-20 NOTE — Care Plan (Signed)
Problem: Adult Inpatient Plan of Care  Goal: Plan of Care Review  05/20/2017 1207 by Lenon Ahmadi, RN  Outcome: Adequate for Discharge  05/20/2017 1036 by Lenon Ahmadi, RN  Outcome: Ongoing (see interventions/notes)  Goal: Patient-Specific Goal (Individualization)  05/20/2017 1207 by Lenon Ahmadi, RN  Outcome: Adequate for Discharge  05/20/2017 1036 by Lenon Ahmadi, RN  Outcome: Ongoing (see interventions/notes)  Goal: Absence of Hospital-Acquired Illness or Injury  05/20/2017 1207 by Lenon Ahmadi, RN  Outcome: Adequate for Discharge  05/20/2017 1036 by Lenon Ahmadi, RN  Outcome: Ongoing (see interventions/notes)  Goal: Optimal Comfort and Wellbeing  05/20/2017 1207 by Lenon Ahmadi, RN  Outcome: Adequate for Discharge  05/20/2017 1036 by Lenon Ahmadi, RN  Outcome: Ongoing (see interventions/notes)  Goal: Rounds/Family Conference  05/20/2017 1207 by Lenon Ahmadi, RN  Outcome: Adequate for Discharge  05/20/2017 1036 by Lenon Ahmadi, RN  Outcome: Ongoing (see interventions/notes)     Problem: Syncope  Goal: Absence of Syncopal Symptoms  05/20/2017 1207 by Lenon Ahmadi, RN  Outcome: Adequate for Discharge  05/20/2017 1036 by Lenon Ahmadi, RN  Outcome: Ongoing (see interventions/notes)  Intervention: Manage Effect of Syncopal Symptoms  Flowsheets  Taken 05/19/2017 2000 by Elder Love, LPN  Syncope Management: position changed slowly  Taken 05/20/2017 1036 by Lenon Ahmadi, RN  Supportive Measures: active listening utilized;self-care encouraged     Problem: Fall Injury Risk  Goal: Absence of Fall and Fall-Related Injury  05/20/2017 1207 by Lenon Ahmadi, RN  Outcome: Adequate for Discharge  05/20/2017 1036 by Lenon Ahmadi, RN  Outcome: Ongoing (see interventions/notes)  Intervention: Identify and Manage Contributors to Fall Injury Risk  Flowsheets (Taken 05/20/2017 1036)  Self-Care Promotion: independence encouraged;BADL personal objects within reach  Medication Review/Management: medications reviewed;high risk medications  identified  Intervention: Promote Injury-Free Environment  Flowsheets (Taken 05/20/2017 0839)  Safety Promotion/Fall Prevention: safety round/check completed;nonskid shoes/slippers when out of bed  Environmental Safety Modification: assistive device/personal items within reach;clutter free environment maintained     Problem: Pain Acute  Goal: Optimal Pain Control  05/20/2017 1207 by Lenon Ahmadi, RN  Outcome: Adequate for Discharge  05/20/2017 1036 by Lenon Ahmadi, RN  Outcome: Ongoing (see interventions/notes)  Intervention: Develop Pain Management Plan  Flowsheets (Taken 05/20/2017 1036)  Pain Intervention : Medication (see eMAR);PRN Medication  Sensory Stimulation Regulation: auditory stimulation minimized  Intervention: Prevent or Manage Pain  Flowsheets  Taken 05/19/2017 2000 by Claris Gladden, LPN  Sleep/Rest Enhancement: awakenings minimized  Taken 05/20/2017 1036 by Lenon Ahmadi, RN  Sensory Stimulation Regulation: auditory stimulation minimized     Problem: Discharge Needs Assessment  Goal: Discharge Needs Assessment  05/20/2017 1207 by Lenon Ahmadi, RN  Outcome: Adequate for Discharge  05/20/2017 1036 by Lenon Ahmadi, RN  Outcome: Ongoing (see interventions/notes)     Problem: Skin Injury Risk Increased  Goal: Skin Health and Integrity  05/20/2017 1207 by Lenon Ahmadi, RN  Outcome: Adequate for Discharge  05/20/2017 1036 by Lenon Ahmadi, RN  Outcome: Ongoing (see interventions/notes)  Intervention: Optimize Skin Protection  Flowsheets  Taken 05/16/2017 0835 by Cy Blamer, RN  Pressure Reduction Techniques: frequent weight shift encouraged  Taken 05/20/2017 0839 by Lenon Ahmadi, RN  Skin Protection: adhesive use limited  Taken 05/19/2017 2000 by Claris Gladden, LPN  Head of Bed [facility]): HOB at 30-45 degrees     Problem: Acute Rehab Services Goal & Intervention Plan  Goal: Gait Training Goal  Description  Stand Alone Therapy Goal  05/20/2017 1207 by Lenon Ahmadi, RN  Outcome: Adequate for Discharge  05/20/2017  1036 by Lenon Ahmadi, RN  Outcome: Ongoing (see interventions/notes)  Goal: Physical Therapy Goal  Description  Stand Alone Therapy Goal  05/20/2017 1207 by Lenon Ahmadi, RN  Outcome: Adequate for Discharge  05/20/2017 1036 by Lenon Ahmadi, RN  Outcome: Ongoing (see interventions/notes)   PLan of care adequate for discharge.

## 2017-05-20 NOTE — Consults (Signed)
[facility]  Cardiology   Progress Note      Joe, Carroll  Date of Admission:  05/14/2017  Date of service: 05/20/2017  Date of Birth:  04/24/1940  MRN:  [id number]  Hospital Day:  LOS: 6 days     Subjective:     Patient is feeling good per his bladder spasms improved.  His hematuria also improved breath denies any chest pain, short of breath, PND, orthopnea    Objective:     Vital Signs:  Filed Vitals:    05/19/17 1930 05/19/17 2000 05/20/17 0021 05/20/17 0400   BP: (!) 159/89  (!) 175/84 (!) 153/70   Pulse: (!) 101  (!) 107 88   Resp: 18 18 20 18    Temp: 37.1 C (98.8 F)  36.6 C (97.9 F) 36.8 C (98.2 F)   SpO2: 98%  98% 99%       Physical Exam:    General: Patient not in any acute distress.   HEENT: No JVD, No carotid bruit b/l  Lungs: Clear to auscultation and percussion.  Cardiovascular: Heart sounds are regular, systolic murmur.  Apex  Extremities: No edema, good peripheral pulses.  Neurology: Alert, awake and oriented x3     Telemetry reviewed:  Sinus rhythm 60 70    Labs:  No results found for: CHOLESTEROL, HDLCHOL, LDLCHOL, LDLCHOLDIR, TRIG  CBC Results   Recent Labs     05/18/17  0345   WBC 3.8  3.8   HGB 8.2*   HCT 25.3*   PLTCNT 177   BANDS 3      BMP Results   Recent Labs     05/18/17  0345   SODIUM 128*   POTASSIUM 4.5   CHLORIDE 99   CO2 20*   BUN 20   CREATININE 0.61   GFR >60   ANIONGAP 9     Recent Labs     05/18/17  0345   CALCIUM 8.2*   MAGNESIUM 1.7*      Coag Results   No results for input(s): INR, PROTHROMTME, APTT in the last 72 hours.    Invalid input(s): PTT, PT, CREACTPROTIE   Cardiac Results      No results for input(s): UHCEASTTROPI, CKMB, MBINDEX, BNP in the last 72 hours.     Imaging:  XR CHEST PA AND LATERAL   Final Result   Trace bilateral pleural effusions.            Radiologist location ID: [id number]             Current Medications:    Current Facility-Administered Medications:  acetaminophen (TYLENOL) tablet 650 mg Oral Q6H PRN   amiodarone (CORDARONE) tablet 200 mg Oral 2x/day   aspirin  chewable tablet 81 mg 81 mg Oral Daily   ceFEPime (MAXIPIME) 2g in NS 59mL IVPB minibag 2 g Intravenous Q12H   dextrose 50% (0.5 g/mL) injection - syringe 25 g Intravenous Q15 Min PRN   diphenhydrAMINE (BENADRYL) capsule 25 mg Oral Q6H PRN   docusate sodium (COLACE) capsule 100 mg Oral 2x/day   enoxaparin PF (LOVENOX) 40 mg/0.4 mL SubQ injection 40 mg Subcutaneous Q24H   magnesium oxide (MAG-OX) tablet 400 mg Oral 2x/day   metoprolol tartrate (LOPRESSOR) tablet 12.5 mg Oral 2x/day   NS flush syringe 3 mL Intracatheter Q8HRS   NS flush syringe 3 mL Intracatheter Q1H PRN   ondansetron (ZOFRAN) 2 mg/mL injection 4 mg Intravenous Q8H PRN   oxybutynin (DITROPAN) tablet 5 mg Oral 3x/day  phenazopyridine (PYRIDIUM) tablet 100 mg Oral 3x/day-Meals   predniSONE (DELTASONE) tablet 5 mg Oral 2x/day-Food   salicylic acid-sulfur 6%-6% topical shampoo  Apply Topically Once   sodium phosphates (FLEET) 133 mL enema 133 mL Rectal Once PRN   SSIP insulin lispro (HUMALOG) 100 units/mL injection 1-12 Units Subcutaneous 4x/day AC     Allergies   Allergen Reactions   . 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

## 2017-05-21 LAB — URINE CULTURE,ROUTINE: URINE CULTURE: 100000 — AB

## 2017-05-21 NOTE — Discharge Summary (Signed)
[place]      PATIENT NAME:  Joe Carroll, Joe Carroll  MRN:  [id number]  DOB:  1940-07-19    ADMISSION DATE:  05/14/2017  DISCHARGE DATE:  05/20/17    ATTENDING PHYSICIAN: Azucena Fallen, DO  PRIMARY CARE PHYSICIAN: Prescilla Sours, MD     ADMISSION DIAGNOSIS: Syncope      DISCHARGE DIAGNOSIS:       Syncope  -stable/improving, continues to do well, d/c today  -likely secondary to orthostatic hypotension and paroxysmal atrial fibrillation, orthostatics grossly positive on admission   -metoprolol 12.5 mg p.o. B.i.d., tolerating   -Cardiology and neurology   -high fall risk precautions  -PT following    Hyponatremia  -stable  -continue to monitor  -DC NS    Paroxysmal atrial fibrillation  -HR improving, remains in sinus rhythm  -continue metoprolol 12.5 mg p.o. B.i.d., s/p IV digoxin 250 mcg once per cardiology   -amiodarone 200 milligrams b.i.d. Per Cardiology.  -monitor on telemetry   -continue aspirin, patient refused anticoagulation adamantly     Urinary retention/BPH/prostate cancer w/ bone mets  -maintain indwelling Foley catheter  -Flomax 0.4 mg p.o. stopped as patient is refusing  -continue follow-up with primary Urology as scheduled  -oxybutynin possible bladder spasms    Prostate Cancer with bone mets  -follows in [place]    Possible CAUTI, likely considered POA   -UA consistent with UTI  -d/c with Bactrim x7 days  -follow culture    Diabetes mellitus type 2  -continue baseline Janumet  -place on sliding scale insulin coverage per protocol  -diabetic diet      Follow-up Information     Riley Churches, MD.    Specialty:  CARDIOLOGY  Contact information:  [place] [address]  [phone]                   DAY OF DISCHARGE EXAM:  Constitutional:  No distress and alert and oriented.  Eyes:  Conjunctiva clear, Pupils equal and round.   Neck:  Supple, symmetrical, trachea midline.  Respiratory:  Clear to auscultation bilaterally, no  wheezes.  Cardiovascular:  Regular rate and rhythm, no murmurs.  Gastrointestinal:  Soft, non-tender, Bowel sounds normal, non-distended.  Musculoskeletal:  Moves all 4 limbs  Integumentary:  Skin warm and dry, No rashes or lesions.  Neurologic:  CN II - XII grossly intact, No tremor.   Extremities: No BLE edema      REASON FOR HOSPITALIZATION AND HOSPITAL COURSE:      Joe Carroll is a 77 y.o., White male with a past medical history of paroxysmal atrial fibrillation diagnosed 3-4 months ago who presents with complaints of recurrent syncope.  Syncopal episode was experienced after ambulating 20 ft when attempting to get into his [employer].  The patient reports position changes such as standing and ambulating are directly related to his episodes.  He believed that his heart rate is significantly elevated and his blood pressure is low during these episodes.  He recently had dose adjustments made to his metoprolol from 50 mg twice daily to 75 mg twice daily with worsening of his syncopal episodes in relation to this medication change.  The patient reports he is unable to walk more than 10 or 20 feet at any given time without suffering a "blackout".  He has been seen by electrophysiologist in [place] who recommended discontinuing Flomax to avoid any further hypotension, as a result the patient  has suffered urinary retention and has had to have a Foley catheter inserted a week prior at [facility]  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  Due to his urinary issues, the patient reports that he drinks copious amounts of water daily to keep his ureteral stent and and Foley catheter draining well. He was found to have afib w/ RVR and hyponatremia and was subsequently admitted      [place]:     The patient was seen by cardiology and given a dose of IV digoxin which helped his heart rate some. He was found to be  markedly orthostatic on admission. He remained on bed rest for several days because of the severity of his symptoms. He was eventually started on Lopressor 12.5 mg BID which he tolerated. Cardiology eventually started the patient on Amiodarone which converted the patient back into sinus rhythm. His heart rate remained elevated for an 1-2 days but eventually improved. His sodium slowly improved with IVF. He developed bladder spasms and possibly had a UTI and was started on cefepime and transitioned to Bactrim on d/c. He will follow up with Cardiology in 1 week.            DISCHARGE MEDICATIONS:     Current Discharge Medication List      START taking these medications.      Details   amiodarone 200 mg Tablet  Commonly known as:  PACERONE   200 mg, Oral, 2 TIMES DAILY  Qty:  60 Tab  Refills:  0     oxybutynin 5 mg Tablet  Commonly known as:  DITROPAN   5 mg, Oral, 3 TIMES DAILY  Qty:  90 Tab  Refills:  0     phenazopyridine 100 mg Tablet  Commonly known as:  PYRIDIUM   100 mg, Oral, 3 TIMES DAILY WITH MEALS  Qty:  90 Tab  Refills:  0     trimethoprim-sulfamethoxazole 800-160 mg Tablet  Commonly known as:  BACTRIM DS   1 Tab, Oral, 2 TIMES DAILY  Qty:  14 Tab  Refills:  0        CONTINUE these medications which have CHANGED during your visit.      Details   metoprolol tartrate 25 mg Tablet  Commonly known as:  LOPRESSOR  What changed:     medication strength   how much to take   12.5 mg, Oral, 2 TIMES DAILY  Refills:  0        CONTINUE these medications - NO CHANGES were made during your visit.      Details   aspirin 81 mg Tablet, Chewable   81 mg, Oral, NIGHTLY  Refills:  0     calcium citrate-vitamin D3 200 mg calcium -250 unit Tablet  Commonly known as:  CITRACAL   2 Tabs, Oral, 2 TIMES DAILY  Refills:  0     JANUMET 50-500 mg Tablet  Generic drug:  sitaGLIPtin-metFORMIN   TAKE 1 TABLET BY MOUTH TWICE A DAY  Qty:  180 Tab  Refills:  3     predniSONE 5 mg Tablet  Commonly known as:  DELTASONE   5 mg, Oral, 2 TIMES  DAILY  Refills:  0              DISCHARGE LABS:            Copies sent to Care Team  Relationship Specialty Notifications Start End    Prescilla Sours, MD PCP - General FAMILY PRACTICE  05/14/17     Phone: [phone] Fax: [phone]         [address] [place] [address]            Time spent on discharge: 45 minutes    Braedin Millhouse L. [place], [place] Hospitalist   05/21/2017 10:00

## 2017-05-23 ENCOUNTER — Emergency Department (HOSPITAL_COMMUNITY): Payer: Medicare Other | Admitting: Radiology

## 2017-05-23 ENCOUNTER — Inpatient Hospital Stay
Admission: EM | Admit: 2017-05-23 | Discharge: 2017-06-22 | DRG: 175 | Disposition: E | Payer: Medicare Other | Attending: Hospitalist | Admitting: Hospitalist

## 2017-05-23 ENCOUNTER — Other Ambulatory Visit: Payer: Self-pay

## 2017-05-23 ENCOUNTER — Inpatient Hospital Stay (HOSPITAL_COMMUNITY): Payer: Medicare Other | Admitting: Hospitalist

## 2017-05-23 ENCOUNTER — Emergency Department (HOSPITAL_COMMUNITY): Payer: Medicare Other

## 2017-05-23 ENCOUNTER — Encounter (HOSPITAL_COMMUNITY): Payer: Self-pay

## 2017-05-23 DIAGNOSIS — I2699 Other pulmonary embolism without acute cor pulmonale: Secondary | ICD-10-CM

## 2017-05-23 DIAGNOSIS — N179 Acute kidney failure, unspecified: Secondary | ICD-10-CM | POA: Diagnosis present

## 2017-05-23 DIAGNOSIS — R160 Hepatomegaly, not elsewhere classified: Secondary | ICD-10-CM

## 2017-05-23 DIAGNOSIS — E872 Acidosis, unspecified: Secondary | ICD-10-CM | POA: Diagnosis present

## 2017-05-23 DIAGNOSIS — R748 Abnormal levels of other serum enzymes: Secondary | ICD-10-CM | POA: Diagnosis present

## 2017-05-23 DIAGNOSIS — R001 Bradycardia, unspecified: Secondary | ICD-10-CM

## 2017-05-23 DIAGNOSIS — N029 Recurrent and persistent hematuria with unspecified morphologic changes: Secondary | ICD-10-CM | POA: Diagnosis present

## 2017-05-23 DIAGNOSIS — R338 Other retention of urine: Secondary | ICD-10-CM

## 2017-05-23 DIAGNOSIS — D696 Thrombocytopenia, unspecified: Secondary | ICD-10-CM | POA: Diagnosis not present

## 2017-05-23 DIAGNOSIS — C7951 Secondary malignant neoplasm of bone: Secondary | ICD-10-CM | POA: Diagnosis present

## 2017-05-23 DIAGNOSIS — E1165 Type 2 diabetes mellitus with hyperglycemia: Secondary | ICD-10-CM

## 2017-05-23 DIAGNOSIS — R42 Dizziness and giddiness: Secondary | ICD-10-CM | POA: Diagnosis present

## 2017-05-23 DIAGNOSIS — N1330 Unspecified hydronephrosis: Secondary | ICD-10-CM

## 2017-05-23 DIAGNOSIS — F40240 Claustrophobia: Secondary | ICD-10-CM

## 2017-05-23 DIAGNOSIS — I10 Essential (primary) hypertension: Secondary | ICD-10-CM

## 2017-05-23 DIAGNOSIS — E871 Hypo-osmolality and hyponatremia: Secondary | ICD-10-CM

## 2017-05-23 DIAGNOSIS — C689 Malignant neoplasm of urinary organ, unspecified: Secondary | ICD-10-CM | POA: Diagnosis present

## 2017-05-23 DIAGNOSIS — Z66 Do not resuscitate: Secondary | ICD-10-CM | POA: Diagnosis present

## 2017-05-23 DIAGNOSIS — E119 Type 2 diabetes mellitus without complications: Secondary | ICD-10-CM | POA: Diagnosis present

## 2017-05-23 DIAGNOSIS — I82402 Acute embolism and thrombosis of unspecified deep veins of left lower extremity: Secondary | ICD-10-CM | POA: Diagnosis present

## 2017-05-23 DIAGNOSIS — N401 Enlarged prostate with lower urinary tract symptoms: Secondary | ICD-10-CM | POA: Diagnosis present

## 2017-05-23 DIAGNOSIS — I959 Hypotension, unspecified: Secondary | ICD-10-CM

## 2017-05-23 DIAGNOSIS — Z7982 Long term (current) use of aspirin: Secondary | ICD-10-CM

## 2017-05-23 DIAGNOSIS — C787 Secondary malignant neoplasm of liver and intrahepatic bile duct: Secondary | ICD-10-CM

## 2017-05-23 DIAGNOSIS — I509 Heart failure, unspecified: Secondary | ICD-10-CM

## 2017-05-23 DIAGNOSIS — Z7951 Long term (current) use of inhaled steroids: Secondary | ICD-10-CM

## 2017-05-23 DIAGNOSIS — R6521 Severe sepsis with septic shock: Secondary | ICD-10-CM | POA: Diagnosis not present

## 2017-05-23 DIAGNOSIS — R0902 Hypoxemia: Secondary | ICD-10-CM

## 2017-05-23 DIAGNOSIS — R0600 Dyspnea, unspecified: Secondary | ICD-10-CM

## 2017-05-23 DIAGNOSIS — R9431 Abnormal electrocardiogram [ECG] [EKG]: Secondary | ICD-10-CM

## 2017-05-23 DIAGNOSIS — E1143 Type 2 diabetes mellitus with diabetic autonomic (poly)neuropathy: Secondary | ICD-10-CM

## 2017-05-23 DIAGNOSIS — I480 Paroxysmal atrial fibrillation: Secondary | ICD-10-CM

## 2017-05-23 DIAGNOSIS — A419 Sepsis, unspecified organism: Secondary | ICD-10-CM

## 2017-05-23 DIAGNOSIS — Z960 Presence of urogenital implants: Secondary | ICD-10-CM | POA: Diagnosis present

## 2017-05-23 DIAGNOSIS — R609 Edema, unspecified: Secondary | ICD-10-CM

## 2017-05-23 DIAGNOSIS — E861 Hypovolemia: Secondary | ICD-10-CM | POA: Diagnosis present

## 2017-05-23 DIAGNOSIS — E875 Hyperkalemia: Secondary | ICD-10-CM

## 2017-05-23 DIAGNOSIS — Z794 Long term (current) use of insulin: Secondary | ICD-10-CM

## 2017-05-23 DIAGNOSIS — R042 Hemoptysis: Secondary | ICD-10-CM

## 2017-05-23 DIAGNOSIS — R55 Syncope and collapse: Secondary | ICD-10-CM

## 2017-05-23 DIAGNOSIS — C61 Malignant neoplasm of prostate: Secondary | ICD-10-CM | POA: Diagnosis present

## 2017-05-23 DIAGNOSIS — Z87891 Personal history of nicotine dependence: Secondary | ICD-10-CM

## 2017-05-23 DIAGNOSIS — R000 Tachycardia, unspecified: Secondary | ICD-10-CM

## 2017-05-23 DIAGNOSIS — I499 Cardiac arrhythmia, unspecified: Secondary | ICD-10-CM

## 2017-05-23 DIAGNOSIS — K7200 Acute and subacute hepatic failure without coma: Secondary | ICD-10-CM | POA: Diagnosis not present

## 2017-05-23 LAB — POC BLOOD GLUCOSE (RESULTS)
GLUCOSE, POC: 205 mg/dl — ABNORMAL HIGH (ref 70–110)
GLUCOSE, POC: 208 mg/dl — ABNORMAL HIGH (ref 70–110)
GLUCOSE, POC: 187 mg/dl — ABNORMAL HIGH (ref 70–110)
GLUCOSE, POC: 182 mg/dl — ABNORMAL HIGH (ref 70–110)

## 2017-05-23 LAB — MANUAL DIFFERENTIAL
BAND %: 11 % — ABNORMAL HIGH (ref 0–6)
LYMPHOCYTE %: 11 % — ABNORMAL LOW (ref 20–40)
LYMPHOCYTE ABSOLUTE: 0.64 x10ˆ3/uL
METAMYELOCYTE %: 1 %
METAMYELOCYTE ABSOLUTE: 0.06 x10ˆ3/uL
MONOCYTE %: 7 % (ref 2–11)
MONOCYTE %: 7 % (ref 2–11)
MONOCYTE ABSOLUTE: 0.41 x10ˆ3/uL
MYELOCYTE %: 5 %
MYELOCYTE ABSOLUTE: 0.29 x10ˆ3/uL
NEUTROPHIL %: 63 % (ref 50–70)
NEUTROPHIL ABSOLUTE: 4.29 x10ˆ3/uL
NUCLEATED RBC MANUAL: 5
PLATELET ESTIMATE: ADEQUATE
PROMYELOCYTE %: 2 %
PROMYELOCYTE ABSOLUTE: 0.12 x10ˆ3/uL
WBC MORPHOLOGY COMMENT: NORMAL
WBC: 5.8 x10ˆ3/uL

## 2017-05-23 LAB — CBC WITH DIFF
BASOPHIL #: 0 x10ˆ3/uL (ref 0.00–0.20)
BASOPHIL %: 0 %
EOSINOPHIL #: 0 x10ˆ3/uL (ref 0.00–0.50)
EOSINOPHIL %: 0 %
HCT: 29.1 % — ABNORMAL LOW (ref 38.9–50.5)
HGB: 9.5 g/dL — ABNORMAL LOW (ref 13.4–17.3)
LYMPHOCYTE #: 0.4 x10ˆ3/uL — ABNORMAL LOW (ref 0.80–3.20)
LYMPHOCYTE %: 9 %
MCH: 33.2 pg — ABNORMAL HIGH (ref 27.9–33.1)
MCHC: 32.6 g/dL — ABNORMAL LOW (ref 32.8–36.0)
MCV: 101.8 fL — ABNORMAL HIGH (ref 82.4–95.0)
MONOCYTE #: 0.5 x10ˆ3/uL (ref 0.20–0.80)
MONOCYTE %: 10 %
MPV: 7.5 fL (ref 6.0–10.2)
NEUTROPHIL #: 4.2 x10ˆ3/uL (ref 1.60–5.50)
NEUTROPHIL %: 81 %
PLATELETS: 163 x10ˆ3/uL (ref 140–440)
RBC: 2.86 x10ˆ6/uL — ABNORMAL LOW (ref 4.40–5.68)
RDW: 22.5 % — ABNORMAL HIGH (ref 10.9–15.1)
WBC: 5.8 x10ˆ3/uL (ref 3.3–9.3)

## 2017-05-23 LAB — ARTERIAL BLOOD GAS
PCO2 (ARTERIAL): 34 mm/Hg — ABNORMAL LOW (ref 35.0–48.0)
PH (ARTERIAL): 7.12 — CL (ref 7.35–7.45)

## 2017-05-23 LAB — CBC
HCT: 24.6 % — ABNORMAL LOW (ref 38.9–50.5)
HGB: 7.8 g/dL — ABNORMAL LOW (ref 13.4–17.3)
MCH: 32.9 pg (ref 27.9–33.1)
MCHC: 31.6 g/dL — ABNORMAL LOW (ref 32.8–36.0)
MCV: 104.1 fL — ABNORMAL HIGH (ref 82.4–95.0)
MPV: 7.6 fL (ref 6.0–10.2)
PLATELETS: 114 x10ˆ3/uL — ABNORMAL LOW (ref 140–440)
RBC: 2.36 x10?6/uL — ABNORMAL LOW (ref 4.40–5.68)
RBC: 2.36 x10ˆ6/uL — ABNORMAL LOW (ref 4.40–5.68)
RDW: 22.3 % — ABNORMAL HIGH (ref 10.9–15.1)
WBC: 5.8 x10ˆ3/uL (ref 3.3–9.3)

## 2017-05-23 LAB — ARTERIAL BLOOD GAS - MLS OP ONLY
%FIO2 (ARTERIAL): 100 %
BASE DEFICIT: 17.2 mmol/L — ABNORMAL HIGH (ref 0.0–3.0)
BICARBONATE (ARTERIAL): 11.6 mmol/L — ABNORMAL LOW (ref 22.0–28.0)
O2 SATURATION (ARTERIAL): 99.9 %
PAO2/FIO2 RATIO: 263 (ref <=200)
PO2 (ARTERIAL): 263 mmHg

## 2017-05-23 LAB — URINALYSIS, MACRO/MICRO
NITRITE: POSITIVE — AB
PH: 5 (ref 5.0–7.0)
PROTEIN: 100 mg/dL — AB
SPECIFIC GRAVITY: 1.015 (ref 1.010–1.025)
UROBILINOGEN: 1 mg/dL

## 2017-05-23 LAB — BASIC METABOLIC PANEL
ANION GAP: 17 mmol/L
BUN/CREA RATIO: 27
BUN: 30 mg/dL — ABNORMAL HIGH (ref 10–25)
CALCIUM: 9.4 mg/dL (ref 8.8–10.3)
CHLORIDE: 92 mmol/L — ABNORMAL LOW (ref 98–111)
CO2 TOTAL: 16 mmol/L — ABNORMAL LOW (ref 21–35)
CREATININE: 1.12 mg/dL (ref <=1.30)
ESTIMATED GFR: >60 mL/min/1.73mˆ2
GLUCOSE: 235 mg/dL — ABNORMAL HIGH (ref 70–110)
POTASSIUM: 5.5 mmol/L — ABNORMAL HIGH (ref 3.5–5.0)
SODIUM: 125 mmol/L — ABNORMAL LOW (ref 135–145)

## 2017-05-23 LAB — PTT (PARTIAL THROMBOPLASTIN TIME)
APTT: 29.8 seconds (ref 25.0–37.0)
APTT: 30.3 seconds (ref 25.0–37.0)
APTT: 152.1 seconds — ABNORMAL HIGH (ref 25.0–37.0)

## 2017-05-23 LAB — VENOUS BLOOD GAS
%FIO2 (VENOUS): 21 %
BASE DEFICIT: 8.8 mmol/L — ABNORMAL HIGH (ref <=3.0)
BICARBONATE (VENOUS): 17.5 mmol/L — ABNORMAL LOW (ref 23.0–33.0)
O2 SATURATION (VENOUS): 64.5 % — ABNORMAL LOW (ref 95.0–98.0)
PCO2 (VENOUS): 30 mm/Hg — ABNORMAL LOW (ref 38.00–50.00)
PH (VENOUS): 7.33 (ref 7.32–7.45)
PO2 (VENOUS): 45 mm/Hg (ref 30.0–50.0)

## 2017-05-23 LAB — HEPATIC FUNCTION PANEL
ALBUMIN: 2.5 g/dL — ABNORMAL LOW (ref 3.2–4.6)
ALKALINE PHOSPHATASE: 477 U/L — ABNORMAL HIGH (ref 20–130)
ALT (SGPT): 216 U/L — ABNORMAL HIGH (ref <=52)
AST (SGOT): 957 U/L — ABNORMAL HIGH (ref <=35)
BILIRUBIN DIRECT: 0.5 mg/dL — ABNORMAL HIGH (ref <=0.3)
BILIRUBIN TOTAL: 0.9 mg/dL (ref 0.3–1.2)
PROTEIN TOTAL: 4.9 g/dL — ABNORMAL LOW (ref 6.0–8.3)

## 2017-05-23 LAB — INFLUENZA VIRUS TYPE A AND TYPE B, PCR
INFLUENZA VIRUS TYPE A: NEGATIVE
INFLUENZA VIRUS TYPE B: NEGATIVE

## 2017-05-23 LAB — B-TYPE NATRIURETIC PEPTIDE: BNP: 51 pg/mL (ref 0–100)

## 2017-05-23 LAB — PT/INR: INR: 1.25 — ABNORMAL HIGH (ref 0.80–1.10)

## 2017-05-23 LAB — TROPONIN-I: TROPONIN I: 0.04 ng/mL (ref <=0.04)

## 2017-05-23 LAB — SODIUM: SODIUM: 125 mmol/L — ABNORMAL LOW (ref 135–145)

## 2017-05-23 MED ORDER — CALCIUM GLUCONATE 100 MG/ML (10 %) INTRAVENOUS SOLUTION
2000.00 mg | INTRAVENOUS | Status: AC
Start: 2017-05-23 — End: 2017-05-23
  Administered 2017-05-23: 2000 mg via INTRAVENOUS
  Filled 2017-05-23: qty 10

## 2017-05-23 MED ORDER — GI COCKTAIL (ANTACID SUSP, LIDOCAINE)
15.00 mL | Status: DC
Start: 2017-05-23 — End: 2017-05-23
  Filled 2017-05-23: qty 15

## 2017-05-23 MED ORDER — SODIUM CHLORIDE 0.9 % INTRAVENOUS SOLUTION
50.0000 ug/min | INTRAVENOUS | Status: DC
Start: 2017-05-24 — End: 2017-05-24
  Administered 2017-05-24 (×2): 45 ug/min via INTRAVENOUS
  Administered 2017-05-24: 25 ug/min via INTRAVENOUS
  Administered 2017-05-24: 40 ug/min via INTRAVENOUS
  Administered 2017-05-24: 25 ug/min via INTRAVENOUS
  Administered 2017-05-24: 20 ug/min via INTRAVENOUS
  Administered 2017-05-24: 40 ug/min via INTRAVENOUS
  Administered 2017-05-24: 30 ug/min via INTRAVENOUS
  Administered 2017-05-24: 10 ug/min via INTRAVENOUS
  Administered 2017-05-24: 35 ug/min via INTRAVENOUS
  Administered 2017-05-24: 20 ug/min via INTRAVENOUS
  Administered 2017-05-24: 30 ug/min via INTRAVENOUS
  Administered 2017-05-24 (×3): 35 ug/min via INTRAVENOUS
  Administered 2017-05-24: 30 ug/min via INTRAVENOUS
  Administered 2017-05-24 (×3): 35 ug/min via INTRAVENOUS
  Administered 2017-05-24: 30 ug/min via INTRAVENOUS
  Administered 2017-05-24 (×4): 35 ug/min via INTRAVENOUS
  Administered 2017-05-24: 40 ug/min via INTRAVENOUS
  Filled 2017-05-23 (×5): qty 1

## 2017-05-23 MED ORDER — INSULIN LISPRO 100 UNIT/ML SUBCUTANEOUS SSIP - ~~LOC~~/GRMC
1.0000 [IU] | Freq: Four times a day (QID) | SUBCUTANEOUS | Status: DC
Start: 2017-05-23 — End: 2017-05-24
  Administered 2017-05-23: 0 [IU] via SUBCUTANEOUS
  Filled 2017-05-23: qty 300

## 2017-05-23 MED ORDER — OXYBUTYNIN CHLORIDE 5 MG TABLET
5.00 mg | ORAL_TABLET | Freq: Three times a day (TID) | ORAL | Status: DC
Start: 2017-05-23 — End: 2017-05-24
  Administered 2017-05-23: 5 mg via ORAL
  Filled 2017-05-23 (×7): qty 1

## 2017-05-23 MED ORDER — INSULIN LISPRO (U-100) 100 UNIT/ML SUBCUTANEOUS SOLUTION
10.0000 [IU] | SUBCUTANEOUS | Status: AC
Start: 2017-05-24 — End: 2017-05-23
  Administered 2017-05-23: 10 [IU] via SUBCUTANEOUS
  Filled 2017-05-23: qty 3

## 2017-05-23 MED ORDER — SODIUM BICARBONATE 8.4 % (1 MEQ/ML) INTRAVENOUS SYRINGE
50.0000 meq | INJECTION | INTRAVENOUS | Status: AC
Start: 2017-05-23 — End: 2017-05-23
  Administered 2017-05-23: 50 meq via INTRAVENOUS
  Filled 2017-05-23: qty 50

## 2017-05-23 MED ORDER — GI COCKTAIL (ANTACID SUSP, HYOSCYAMINE, LIDOCAINE)
55.00 mL | ORAL | Status: AC
Start: 2017-05-23 — End: 2017-05-23
  Administered 2017-05-23: 55 mL via ORAL
  Filled 2017-05-23: qty 55

## 2017-05-23 MED ORDER — ALBUTEROL SULFATE 2.5 MG/3 ML (0.083 %) SOLUTION FOR NEBULIZATION
2.5000 mg | INHALATION_SOLUTION | RESPIRATORY_TRACT | Status: DC
Start: 2017-05-24 — End: 2017-05-23

## 2017-05-23 MED ORDER — SODIUM CHLORIDE 0.9 % INTRAVENOUS SOLUTION
2000.0000 mg | INTRAVENOUS | Status: AC
Start: 2017-05-24 — End: 2017-05-24
  Administered 2017-05-23: 2000 mg via INTRAVENOUS
  Administered 2017-05-24: 0 mg via INTRAVENOUS
  Filled 2017-05-23: qty 20

## 2017-05-23 MED ORDER — FUROSEMIDE 10 MG/ML INJECTION SOLUTION
20.0000 mg | Freq: Once | INTRAMUSCULAR | Status: AC
Start: 2017-05-23 — End: 2017-05-23
  Administered 2017-05-23: 20 mg via INTRAVENOUS
  Filled 2017-05-23: qty 4

## 2017-05-23 MED ORDER — ALUMINUM-MAG HYDROXIDE-SIMETHICONE 200 MG-200 MG-20 MG/5 ML ORAL SUSP
20.0000 mL | ORAL | Status: DC | PRN
Start: 2017-05-23 — End: 2017-05-24

## 2017-05-23 MED ORDER — MULTIVITAMIN WITH MINERALS-FERROUS SULFATE 4.5 MG IRON TABLET
1.0000 | ORAL_TABLET | Freq: Every day | ORAL | Status: DC
Start: 2017-05-23 — End: 2017-05-24
  Administered 2017-05-23: 1 via ORAL
  Administered 2017-05-24: 0 via ORAL
  Filled 2017-05-23 (×3): qty 1

## 2017-05-23 MED ORDER — DEXTROSE 50 % IN WATER (D50W) INTRAVENOUS SYRINGE
25.0000 g | INJECTION | INTRAVENOUS | Status: DC | PRN
Start: 2017-05-23 — End: 2017-05-24

## 2017-05-23 MED ORDER — CALCIUM CARBONATE 500 MG-VITAMIN D3 5 MCG (200 UNIT) TABLET
2.00 | ORAL_TABLET | Freq: Two times a day (BID) | ORAL | Status: DC
Start: 2017-05-23 — End: 2017-05-24
  Administered 2017-05-23: 2 via ORAL
  Administered 2017-05-24: 0 via ORAL
  Filled 2017-05-23 (×5): qty 2

## 2017-05-23 MED ORDER — IOVERSOL 320 MG IODINE/ML INTRAVENOUS SOLUTION
120.00 mL | INTRAVENOUS | Status: AC
Start: 2017-05-23 — End: 2017-05-23
  Administered 2017-05-23: 60 mL via INTRAVENOUS

## 2017-05-23 MED ORDER — METOPROLOL TARTRATE 25 MG TABLET
12.50 mg | ORAL_TABLET | Freq: Two times a day (BID) | ORAL | Status: DC
Start: 2017-05-23 — End: 2017-05-24
  Administered 2017-05-23 – 2017-05-24 (×2): 0 mg via ORAL
  Filled 2017-05-23 (×5): qty 0.5

## 2017-05-23 MED ORDER — PREDNISONE 5 MG TABLET
5.00 mg | ORAL_TABLET | Freq: Two times a day (BID) | ORAL | Status: DC
Start: 2017-05-23 — End: 2017-05-24
  Administered 2017-05-23: 5 mg via ORAL
  Filled 2017-05-23 (×5): qty 1

## 2017-05-23 MED ORDER — SODIUM CHLORIDE 0.9 % IV BOLUS
500.0000 mL | INJECTION | Status: AC
Start: 2017-05-23 — End: 2017-05-24
  Administered 2017-05-23: 500 mL via INTRAVENOUS
  Administered 2017-05-24 (×2): 0 mL via INTRAVENOUS

## 2017-05-23 MED ORDER — SODIUM CHLORIDE 0.9 % INTRAVENOUS SOLUTION
INTRAVENOUS | Status: DC
Start: 2017-05-23 — End: 2017-05-23

## 2017-05-23 MED ORDER — ASPIRIN 81 MG CHEWABLE TABLET
81.00 mg | CHEWABLE_TABLET | Freq: Every evening | ORAL | Status: DC
Start: 2017-05-23 — End: 2017-05-24
  Administered 2017-05-23: 81 mg via ORAL
  Filled 2017-05-23 (×3): qty 1

## 2017-05-23 MED ORDER — ACETAMINOPHEN 325 MG TABLET
650.0000 mg | ORAL_TABLET | Freq: Four times a day (QID) | ORAL | Status: DC | PRN
Start: 2017-05-23 — End: 2017-05-24

## 2017-05-23 MED ORDER — SODIUM CHLORIDE 0.9 % INTRAVENOUS SOLUTION
INTRAVENOUS | Status: DC
Start: 2017-05-23 — End: 2017-05-24

## 2017-05-23 MED ORDER — AMIODARONE 200 MG TABLET
200.00 mg | ORAL_TABLET | Freq: Two times a day (BID) | ORAL | Status: DC
Start: 2017-05-23 — End: 2017-05-24
  Administered 2017-05-23: 200 mg via ORAL
  Administered 2017-05-24: 0 mg via ORAL
  Filled 2017-05-23 (×5): qty 1

## 2017-05-23 MED ORDER — SODIUM CHLORIDE 0.9 % IV BOLUS
500.00 mL | INJECTION | Status: AC
Start: 2017-05-23 — End: 2017-05-23
  Administered 2017-05-23 (×2): 0 mL via INTRAVENOUS

## 2017-05-23 MED ORDER — HEPARIN (PORCINE) 1,000 UNITS/ML BOLUS
80.0000 [IU]/kg | Freq: Once | INTRAMUSCULAR | Status: AC
Start: 2017-05-23 — End: 2017-05-23
  Administered 2017-05-23: 5900 [IU] via INTRAVENOUS
  Filled 2017-05-23: qty 10

## 2017-05-23 MED ORDER — SODIUM CHLORIDE 0.9 % (FLUSH) INJECTION SYRINGE
3.0000 mL | INJECTION | Freq: Three times a day (TID) | INTRAMUSCULAR | Status: DC
Start: 2017-05-23 — End: 2017-05-24
  Administered 2017-05-23: 3 mL
  Administered 2017-05-24: 0 mL
  Administered 2017-05-24: 3 mL

## 2017-05-23 MED ORDER — HEPARIN (PORCINE) 25,000 UNIT/250 ML (100 UNIT/ML) IN DEXTROSE 5 % IV
18.0000 [IU]/kg/h | INTRAVENOUS | Status: DC
Start: 2017-05-23 — End: 2017-05-24
  Administered 2017-05-23 (×4): 18 [IU]/kg/h via INTRAVENOUS
  Administered 2017-05-24: 0 [IU]/kg/h via INTRAVENOUS
  Administered 2017-05-24: 15 [IU]/kg/h via INTRAVENOUS
  Filled 2017-05-23: qty 250

## 2017-05-23 MED ORDER — SODIUM CHLORIDE 0.9 % INJECTION SOLUTION
10.00 mL | INTRAMUSCULAR | Status: DC
Start: 2017-05-23 — End: 2017-05-24

## 2017-05-23 MED ORDER — SODIUM CHLORIDE 0.9 % IV BOLUS
500.0000 mL | INJECTION | Freq: Once | Status: AC
Start: 2017-05-24 — End: 2017-05-24
  Administered 2017-05-23: 500 mL via INTRAVENOUS
  Administered 2017-05-24: 0 mL via INTRAVENOUS

## 2017-05-23 MED ORDER — MECLIZINE 12.5 MG TABLET
12.50 mg | ORAL_TABLET | Freq: Three times a day (TID) | ORAL | Status: DC | PRN
Start: 2017-05-23 — End: 2017-05-24
  Administered 2017-05-23: 12.5 mg via ORAL
  Filled 2017-05-23: qty 1

## 2017-05-23 MED ORDER — SODIUM CHLORIDE 0.9 % (FLUSH) INJECTION SYRINGE
3.0000 mL | INJECTION | INTRAMUSCULAR | Status: DC | PRN
Start: 2017-05-23 — End: 2017-05-24

## 2017-05-23 MED ORDER — METOCLOPRAMIDE 5 MG/ML INJECTION SOLUTION
10.0000 mg | Freq: Four times a day (QID) | INTRAMUSCULAR | Status: DC | PRN
Start: 2017-05-23 — End: 2017-05-24

## 2017-05-23 MED ORDER — SITAGLIPTIN PHOSPHATE 50 MG TABLET
50.00 mg | ORAL_TABLET | Freq: Two times a day (BID) | ORAL | Status: DC
Start: 2017-05-24 — End: 2017-05-24
  Administered 2017-05-24: 07:00:00 0 mg via ORAL
  Filled 2017-05-23 (×5): qty 1

## 2017-05-23 MED ORDER — ALBUTEROL SULFATE 2.5 MG/3 ML (0.083 %) SOLUTION FOR NEBULIZATION
15.0000 mg | INHALATION_SOLUTION | Freq: Once | RESPIRATORY_TRACT | Status: AC
Start: 2017-05-24 — End: 2017-05-23
  Administered 2017-05-23 (×2): 15 mg via RESPIRATORY_TRACT
  Filled 2017-05-23: qty 18

## 2017-05-23 MED ORDER — PHENAZOPYRIDINE 100 MG TABLET
100.00 mg | ORAL_TABLET | Freq: Three times a day (TID) | ORAL | Status: DC
Start: 2017-05-24 — End: 2017-05-24
  Filled 2017-05-23 (×6): qty 1

## 2017-05-23 MED ORDER — DEXTROSE 50 % IN WATER (D50W) INTRAVENOUS SYRINGE
25.0000 g | INJECTION | Freq: Once | INTRAVENOUS | Status: AC
Start: 2017-05-24 — End: 2017-05-23
  Administered 2017-05-23: 50 mL via INTRAVENOUS
  Filled 2017-05-23: qty 50

## 2017-05-23 MED ADMIN — sodium chloride 0.9 % (flush) injection syringe

## 2017-05-23 MED ADMIN — ALPRAZolam 0.5 mg tablet: ORAL | @ 13:00:00

## 2017-05-23 NOTE — H&P (Signed)
[facility]  Hospitalist Admission H&P    [name], [name], 77 y.o. male  Date of Admission:  06/13/2017  Date of Birth:  Jun 07, 1940    Information Obtained from: patient  Chief Complaint:     HPI: Joe Carroll is a 77 y.o., White male who presents with bilateral lower extremity swelling worse for about 2 days.  He also had elevated blood sugar of 320 at home today.  He got concerned and presented to the ER.  Patient has recent hospital stay for atrial fibrillation, orthostatic hypotension and tachycardia recently.  He was started on amiodarone and small dose of beta-blocker and discharged about 2 weeks prior.  He said he was doing fairly okay at home but has been having some shortness of breath with exertion and elevated heart rate.    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  He had left hydronephrosis and had left ureteral stent placed in September 2018. 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  He has paroxysmal AFib and was given Eliquis but had recurrent hematuria so that was stopped.  He was also treated with Lupron every 3 months.  He was getting active chemotherapy up until recently at the [facility] in [place].    He was seen in the ER evaluated with lab work which showed elevated liver tests, patient is having dizziness at this time.  Had a CT of the chest which shows innumerable liver lesions and her right subsegmental pulmonary embolism.  Multiple lung nodules were also seen.  I have seen and evaluated this patient at the bedside on the medical unit.    Past Medical History:   Diagnosis Date   . Acute renal failure (CMS HCC)    . Atrial fibrillation (CMS HCC)     paroxismal   . BPH (benign prostatic hyperplasia)    . Cancer (CMS [place])    . Diabetes mellitus, type 2 (CMS HCC)     . Essential hypertension    . Prostate CA (CMS [place])    . Prostatitis    . Wears dentures    . Wears glasses          Past Surgical History:   Procedure Laterality Date   . HX TURP     . KNEE MASS EXCISION           Medications Prior to Admission     Prescriptions    amiodarone (PACERONE) 200 mg Oral Tablet    Take 1 Tab (200 mg total) by mouth Twice daily for 30 days    aspirin 81 mg Oral Tablet, Chewable    Take 81 mg by mouth Every night     calcium citrate-vitamin D3 (CITRACAL) 200 mg calcium -250 unit Oral Tablet    Take 2 Tabs by mouth Twice daily    JANUMET 50-500 mg Oral Tablet    TAKE 1 TABLET BY MOUTH TWICE A DAY    metoprolol tartrate (LOPRESSOR) 25 mg Oral Tablet    Take 0.5 Tabs (12.5 mg total) by mouth Twice daily    oxybutynin (DITROPAN) 5 mg Oral Tablet    Take 1 Tab (5 mg total) by mouth Three times a day for 30 days    phenazopyridine (PYRIDIUM) 100 mg Oral Tablet    Take 1 Tab (100 mg total)  by mouth Three times daily with meals for 30 days    predniSONE (DELTASONE) 5 mg Oral Tablet    Take 5 mg by mouth Twice daily        Allergies   Allergen Reactions   . Ciprofloxacin Hives/ Urticaria     Social History     Socioeconomic History   . Marital status: Married     Spouse name: Not on file   . Number of children: Not on file   . Years of education: Not on file   . Highest education level: Not on file   Occupational History   . Not on file   Social Needs   . Financial resource strain: Not on file   . Food insecurity:     Worry: Not on file     Inability: Not on file   . Transportation needs:     Medical: Not on file     Non-medical: Not on file   Tobacco Use   . Smoking status: Former Research scientist (life sciences)   . Smokeless tobacco: Never Used   Substance and Sexual Activity   . Alcohol use: Never     Frequency: Never   . Drug use: Never   . Sexual activity: Not Currently   Lifestyle   . Physical activity:     Days per week: Not on file     Minutes per session: Not on file   . Stress: Not on file   Relationships   .  Social connections:     Talks on phone: Not on file     Gets together: Not on file     Attends religious service: Not on file     Active member of club or organization: Not on file     Attends meetings of clubs or organizations: Not on file     Relationship status: Not on file   . Intimate partner violence:     Fear of current or ex partner: Not on file     Emotionally abused: Not on file     Physically abused: Not on file     Forced sexual activity: Not on file   Other Topics Concern   . Not on file   Social History Narrative   . Not on file     Past Family History:     Family Medical History:     Problem Relation (Age of Onset)    Coronary Artery Disease Mother    Diabetes Mother              ROS:   Constitutional: negative, No weight loss  Eyes: negative, No vision change  Ears, nose, mouth, throat, and face: The dizziness  Respiratory: Cough productive of clear sputum  Cardiovascular: Tachycardia  Gastrointestinal: negative  Genitourinary:Foley catheter  Integument/breast: negative  Hematologic/lymphatic: negative  Musculoskeletal:Bilateral leg swelling  Neurological: Dizziness started earlier today  Behavioral/Psych: Anxiety  Endocrine: negative  Allergic/Immunologic: negative      EXAM:  Temperature: 36.6 C (97.9 F)  Heart Rate: (!) 126  BP (Non-Invasive): 101/70  Respiratory Rate: 20  SpO2: 100 %  Pain Score (Numeric, Faces): 0  Constitutional: appears chronically ill  Eyes: Conjunctiva clear., Pupils equal and round. , abnormal findings: nystagmuswithout fatigue right side.  ENT: ENMT without erythema or injection, mucous membranes moist.  Neck: no thyromegaly or lymphadenopathy, supple, symmetrical, trachea midline and No JVD  Respiratory: Clear to auscultation bilaterally.   Cardiovascular:    S1-S2 tachycardic, no murmurs  Gastrointestinal:  Soft, non-tender, Bowel sounds normal, non-distended  Genitourinary: Foley catheter in place  Musculoskeletal: Head atraumatic and normocephalic  Integumentary:   Skin warm and dry and No rashes  Neurologic: Cranial nerves 1-12 intact, motor and sensory system intact.  Non fatigable  nystagmus on the right side  Lymphatic/Immunologic/Hematologic: No lymphadenopathy  Psychiatric: Normal        LABS:    Lab Results Today:    Results for orders placed or performed during the hospital encounter of 06/12/2017 (from the past 24 hour(s))   ECG 12 LEAD - ED USE   Result Value Ref Range    Heart Rate 114 BPM    PR Interval 169 ms    QRS Duration 88 ms    QT Interval 305 ms    QTC Calculation 421 ms    Calculated P Axis 30 deg    QRS Axis -10 deg    Calculated T Axis 64 deg    I 40 Axis Invalid deg    T 40 Axis 6 deg    ST Axis 80 deg    EKG Severity - BORDERLINE ECG -    BASIC METABOLIC PANEL   Result Value Ref Range    SODIUM 125 (L) 135 - 145 mmol/L    POTASSIUM 5.5 (H) 3.5 - 5.0 mmol/L    CHLORIDE 92 (L) 98 - 111 mmol/L    CO2 TOTAL 16 (L) 21 - 35 mmol/L    ANION GAP 17 mmol/L    CALCIUM 9.4 8.8 - 10.3 mg/dL    GLUCOSE 235 (H) 70 - 110 mg/dL    BUN 30 (H) 10 - 25 mg/dL    CREATININE 1.12 <=1.30 mg/dL    BUN/CREA RATIO 27     ESTIMATED GFR >60 Avg: 75 mL/min/1.56m^2   TROPONIN-I   Result Value Ref Range    TROPONIN I 0.04 <=0.04 ng/mL   B-TYPE NATRIURETIC PEPTIDE   Result Value Ref Range    BNP 51 0 - 100 pg/mL   PT/INR   Result Value Ref Range    INR 1.25 (H) 0.80 - 1.10   PTT (PARTIAL THROMBOPLASTIN TIME)   Result Value Ref Range    APTT 29.8 25.0 - 37.0 seconds   VENOUS BLOOD GAS   Result Value Ref Range    %FIO2 (VENOUS) 21.0 %    BICARBONATE (VENOUS) 17.5 (L) 23.0 - 33.0 mmol/L    PCO2 (VENOUS) 30.00 (L) 38.00 - 50.00 mm/Hg    PH (VENOUS) 7.33 7.32 - 7.45    PO2 (VENOUS) 45.0 30.0 - 50.0 mm/Hg    BASE DEFICIT 8.8 (H) -2.0 - 3.0 mmol/L    O2 SATURATION (VENOUS) 64.5 (L) 95.0 - 98.0 %   CBC WITH DIFF   Result Value Ref Range    WBC 5.8 3.3 - 9.3 x10^3/uL    RBC 2.86 (L) 4.40 - 5.68 x10^6/uL    HGB 9.5 (L) 13.4 - 17.3 g/dL    HCT 29.1 (L) 38.9 - 50.5 %    MCV 101.8 (H) 82.4 - 95.0  fL    MCH 33.2 (H) 27.9 - 33.1 pg    MCHC 32.6 (L) 32.8 - 36.0 g/dL    RDW 22.5 (H) 10.9 - 15.1 %    PLATELETS 163 140 - 440 x10^3/uL    MPV 7.5 6.0 - 10.2 fL    NEUTROPHIL % 81 %    LYMPHOCYTE % 9 %    MONOCYTE % 10 %    EOSINOPHIL % 0 %  BASOPHIL % 0 %    NEUTROPHIL # 4.20 1.60 - 5.50 x10^3/uL    LYMPHOCYTE # 0.40 (L) 0.80 - 3.20 x10^3/uL    MONOCYTE # 0.50 0.20 - 0.80 x10^3/uL    EOSINOPHIL # 0.00 0.00 - 0.50 x10^3/uL    BASOPHIL # 0.00 0.00 - 0.20 x10^3/uL   MANUAL DIFFERENTIAL   Result Value Ref Range    NEUTROPHIL % 63 50 - 70 %    LYMPHOCYTE % 11 (L) 20 - 40 %    MONOCYTE % 7 2 - 11 %    BAND % 11 (H) 0 - 6 %    METAMYELOCYTE % 1 %    MYELOCYTE % 5 %    PROMYELOCYTE % 2 %    NEUTROPHIL ABSOLUTE 4.29 x10^3/uL    LYMPHOCYTE ABSOLUTE 0.64 x10^3/uL    MONOCYTE ABSOLUTE 0.41 x10^3/uL    METAMYELOCYTE ABSOLUTE 0.06 x10^3/uL    MYELOCYTE ABSOLUTE 0.29 x10^3/uL    PROMYELOCYTE ABSOLUTE 0.12 x10^3/uL    NUCLEATED RBC MANUAL 5.0     PLATELET ESTIMATE Adequate     ANISOCYTOSIS 4+     MACROCYTOSIS 1+     POLYCHROMASIA 2+     WBC MORPHOLOGY COMMENT Normal     WBC 5.8 x10^3/uL   HEPATIC FUNCTION PANEL   Result Value Ref Range    ALBUMIN 2.5 (L) 3.2 - 4.6 g/dL    ALKALINE PHOSPHATASE 477 (H) 20 - 130 U/L    ALT (SGPT) 216 (H) <=52 U/L    AST (SGOT) 957 (H) <=35 U/L    BILIRUBIN TOTAL 0.9 0.3 - 1.2 mg/dL    BILIRUBIN DIRECT 0.5 (H) <=0.3 mg/dL    PROTEIN TOTAL 4.9 (L) 6.0 - 8.3 g/dL   INFLUENZA VIRUS TYPE A AND TYPE B, PCR   Result Value Ref Range    INFLUENZA VIRUS TYPE A Negative Negative    INFLUENZA VIRUS TYPE B Negative Negative   POC BLOOD GLUCOSE (RESULTS)   Result Value Ref Range    GLUCOSE, POC 205 (H) 70 - 110 mg/dl   URINALYSIS, MACRO/MICRO   Result Value Ref Range    COLOR Red (A) Yellow, Straw, Colorless    APPEARANCE Turbid (A) Clear, Cloudy    SPECIFIC GRAVITY 1.015 1.010 - 1.025    PH 5.0 5.0 - 7.0    LEUKOCYTES Large (A) Negative WBCs/uL    NITRITE Positive (A) Negative    PROTEIN 100  (A) Negative, Trace  mg/dL    GLUCOSE Trace (A) Negative mg/dL    KETONES Small (A) Negative mg/dL    UROBILINOGEN 1.0 normal, 0.2 , 1.0 mg/dL    BILIRUBIN Large (A) Negative mg/dL    BLOOD Large (A) Negative mg/dL    URINALYSIS COMMENTS STOP     RBCS TNTC (A) None /hpf    WBCS 50-100 (A) None /hpf    BACTERIA None None /hpf    SQUAMOUS EPITHELIAL Occasional None, Rare, Occasional /hpf   PTT (PARTIAL THROMBOPLASTIN TIME)   Result Value Ref Range    APTT 30.3 25.0 - 37.0 seconds   POC BLOOD GLUCOSE (RESULTS)   Result Value Ref Range    GLUCOSE, POC 208 (H) 70 - 110 mg/dl       Radiology Results: CT with right subsegmental PE and multiple liver r masses       There is no immunization history on file for this patient.    DNR Status:  Full Code      ASSESSMENT:  Active Hospital Problems    Diagnosis   . Primary Problem: Pulmonary emboli (CMS HCC)   . Urothelial cancer (CMS HCC)   . Status post placement of ureteral stent   . Hydronephrosis of left kidney   . 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 Dizziness   . Elevated liver enzymes   . Liver masses   . Hyponatremia   . Syncope   . Paroxysmal atrial fibrillation (CMS HCC)   . Urinary retention due to benign prostatic hyperplasia   . Diabetes (CMS [place])     Plan    1. Pulmonary embolism-likely malignancy related-start IV heparin.  Will follow blood counts "closely.  Check echocardiogram.  Left lower extremity DVT positive    2. Left lower extremity DVT positive-continue heparin    3. Bilateral lower extremity swelling-could be secondary to new liver lesions.  Check complete abdominal ultrasound including portal vein patency.  Check echocardiogram.  Hold Lasix at this time due to dizziness and recent history of orthostatic hypotension.    4. Borderline low blood pressure-IV fluids    5. Dizziness-concerning for brain metastases-CT with contrast in the a.m.Marland Kitchen  Patient cannot have MRI due to claustrophobia.    6. Diabetes mellitus type 2-fingersticks to 0 5-Januvia 50 b.i.d., ADA diet  started    7. Hyponatremia-likely due to hypervolemia-check urine studies.  Start IV fluids.  Will follow labs in a.m.Marland Kitchen  Repeat sodium at 10:00 pm.     8. Paroxysmal AFib-now in sinus tachy 120s-continue amiodarone continue low-dose metoprolol.      DVT/PE Prophylaxis: Heparin  Disposition Planning: Home discharge       Dorathy Kinsman, MD  Total admission time spent 60 min

## 2017-05-23 NOTE — Care Plan (Signed)
Patient is alert and oriented. Heart rate sinus Tach on monitor. Patient complains of N/V not relieved from medicine. Waiting for doctor's orders. Patient remains NPO. Family at bedside. Will continue to monitor.  Cherie Dark, RN

## 2017-05-23 NOTE — Nurses Notes (Addendum)
Upon arrival to pt room, this RN found pt to be difficult to answer, pupils sluggish, BP low, unable to follow commands.  Hospitalist paged to come see pt and promptly arrived.  Orders were given and carried out.  Pt was transferred to CCU.

## 2017-05-23 NOTE — ED Provider Notes (Addendum)
[facility] Emergency Department           Encounter Diagnoses   Name Primary?   . Swelling    . Hyperkalemia Yes   . AKI (acute kidney injury) (CMS HCC)    . PE (pulmonary thromboembolism) (CMS [facility])        ED Course/Medical Decision Making:  Patient presents with swelling in lower extremities and cough.  History of metastatic prostate cancer.  Slight increased work of breathing and tachycardia noted.  CT PE reveals multiple subsegmental PEs.  Laboratory evaluation shows elevation of liver enzymes, confirmed by CT evaluation with multiple metastatic lesions in the liver.  Patient also has noted acute kidney injury and hyperkalemia.  No EKG changes.  The patient was given calcium and fluids with Lasix for the hyperkalemia.  He was started on heparin drip for his PEs and was admitted for further monitoring and workup to step-down    Total critical care time spent in direct care of this patient with hyperkalemia, PE on presenting history/exam/and complaint, including initial evaluation and stabilization, review of data, re-examination, discussion with admitting and consulting services to arrange definitive care, and exclusive of any procedures performed, was 50 minutes.  This includes initial repeat evaluation, administration of fluids and Lasix, administration of heparin and discussion with the admitting team          Consults:  None      Disposition: Admitted           Current Discharge Medication List          Chief Complaint:  Patient presents with     Chief Complaint   Patient presents with   . Weight Gain     Pt states he was dx'd with A Fib 6 months ago. Curretnly has prostate cancer with mets to the bone. Presents today with fluid retention, tachycardia, and shortness of breath.          HPI    Joe Carroll, date of birth 02-20-1941, is a 77 y.o. male who presents to the Emergency Department with c/o fluid retention x2 days. Patient denies hx of fluid retention. 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 Patient states he finished his first round of chemotherapy on 03/22/2017. Patient reports he is waiting to begin a new medical treatment for his cancer. Patient has hx of stent placed in L kidney. Patient denies hx of blood clots. Patient denies associated fever, abdominal pain, chest pain, & all other sxs. Patient denies any other current complaint.       Review of Systems   Constitutional: Negative for chills and fever.        +fluid retention   HENT: Negative for dental problem and sore throat.    Eyes: Negative for redness and visual disturbance.   Respiratory: Positive for cough. Negative for shortness of breath.    Cardiovascular: Positive for palpitations. Negative for chest pain.   Gastrointestinal: Positive for nausea. Negative for abdominal pain and vomiting.   Endocrine: Negative for polydipsia and polyuria.   Genitourinary: Negative for difficulty urinating and dysuria.   Musculoskeletal: Negative for back pain and neck pain.   Skin: Negative for color change and rash.   Neurological: Negative for weakness and headaches.   Psychiatric/Behavioral: Negative for suicidal ideas. The patient is not nervous/anxious.          Physical Exam   Constitutional: He is oriented to person, place, and time. No distress.  HENT:   Head: Normocephalic and atraumatic.   Eyes: Conjunctivae and EOM are normal.   Neck: Normal range of motion. Neck supple.   Cardiovascular: Regular rhythm, normal heart sounds and intact distal pulses. Tachycardia present.   Pulmonary/Chest: Effort normal. No respiratory distress.   Abdominal: Soft. He exhibits distension. There is no guarding.   Musculoskeletal: Normal range of motion. He exhibits no edema.   Neurological: He is alert and oriented to person, place, and time. He exhibits normal muscle tone.   Skin: Skin is warm and dry.        Psychiatric: He has a normal mood and affect.   Nursing note and vitals reviewed.      Vitals:  Filed Vitals:     06/12/2017 1345 05/28/2017 1415 06/06/2017 1545 06/09/2017 1646   BP: (!) 160/75 (!) 151/91 (!) 122/48 101/70   Pulse: (!) 119 (!) 125 (!) 125 (!) 126   Resp: 20 19 20 20    Temp:       SpO2: 100% 99% 100% 100%       Past Medical History:  Diagnosis     Past Medical History:   Diagnosis Date   . Acute renal failure (CMS HCC)    . Atrial fibrillation (CMS HCC)     paroxismal   . BPH (benign prostatic hyperplasia)    . Cancer (CMS [place])    . Diabetes mellitus, type 2 (CMS HCC)    . Essential hypertension    . Prostate CA (CMS [place])    . Prostatitis    . Wears dentures    . Wears glasses        Past Surgical History:  Past Surgical History:   Procedure Laterality Date   . Hx turp     . Knee mass excision         Family History:   Family History   Problem Relation Age of Onset   . Diabetes Mother    . Coronary Artery Disease Mother        Social History     Social History     Tobacco Use   . Smoking status: Former Research scientist (life sciences)   . Smokeless tobacco: Never Used   Substance Use Topics   . Alcohol use: Never     Frequency: Never   . Drug use: Never         Social History Main Topics     Social History     Substance and Sexual Activity   Drug Use Never           Allergies   Allergen Reactions   . Ciprofloxacin Hives/ Urticaria           Vital Signs:  Pre-disposition vitals  Filed Vitals:    05/28/2017 1345 05/31/2017 1415 06/09/2017 1545 06/16/2017 1646   BP: (!) 160/75 (!) 151/91 (!) 122/48 101/70   Pulse: (!) 119 (!) 125 (!) 125 (!) 126   Resp: 20 19 20 20    Temp:       SpO2: 100% 99% 100% 100%       Old records reviewed by me:  Previous outpatient records      Diagnostics:    Labs:    Results for orders placed or performed during the hospital encounter of 70/35/00   BASIC METABOLIC PANEL   Result Value Ref Range    SODIUM 125 (L) 135 - 145 mmol/L    POTASSIUM 5.5 (H) 3.5 - 5.0 mmol/L    CHLORIDE 92 (  L) 98 - 111 mmol/L    CO2 TOTAL 16 (L) 21 - 35 mmol/L    ANION GAP 17 mmol/L    CALCIUM 9.4 8.8 - 10.3 mg/dL    GLUCOSE 235 (H) 70 - 110 mg/dL     BUN 30 (H) 10 - 25 mg/dL    CREATININE 1.12 <=1.30 mg/dL    BUN/CREA RATIO 27     ESTIMATED GFR >60 Avg: 75 mL/min/1.73m^2   TROPONIN-I   Result Value Ref Range    TROPONIN I 0.04 <=0.04 ng/mL   B-TYPE NATRIURETIC PEPTIDE   Result Value Ref Range    BNP 51 0 - 100 pg/mL   PT/INR   Result Value Ref Range    INR 1.25 (H) 0.80 - 1.10   PTT (PARTIAL THROMBOPLASTIN TIME)   Result Value Ref Range    APTT 29.8 25.0 - 37.0 seconds   VENOUS BLOOD GAS   Result Value Ref Range    %FIO2 (VENOUS) 21.0 %    BICARBONATE (VENOUS) 17.5 (L) 23.0 - 33.0 mmol/L    PCO2 (VENOUS) 30.00 (L) 38.00 - 50.00 mm/Hg    PH (VENOUS) 7.33 7.32 - 7.45    PO2 (VENOUS) 45.0 30.0 - 50.0 mm/Hg    BASE DEFICIT 8.8 (H) -2.0 - 3.0 mmol/L    O2 SATURATION (VENOUS) 64.5 (L) 95.0 - 98.0 %   CBC WITH DIFF   Result Value Ref Range    WBC 5.8 3.3 - 9.3 x10^3/uL    RBC 2.86 (L) 4.40 - 5.68 x10^6/uL    HGB 9.5 (L) 13.4 - 17.3 g/dL    HCT 29.1 (L) 38.9 - 50.5 %    MCV 101.8 (H) 82.4 - 95.0 fL    MCH 33.2 (H) 27.9 - 33.1 pg    MCHC 32.6 (L) 32.8 - 36.0 g/dL    RDW 22.5 (H) 10.9 - 15.1 %    PLATELETS 163 140 - 440 x10^3/uL    MPV 7.5 6.0 - 10.2 fL    NEUTROPHIL % 81 %    LYMPHOCYTE % 9 %    MONOCYTE % 10 %    EOSINOPHIL % 0 %    BASOPHIL % 0 %    NEUTROPHIL # 4.20 1.60 - 5.50 x10^3/uL    LYMPHOCYTE # 0.40 (L) 0.80 - 3.20 x10^3/uL    MONOCYTE # 0.50 0.20 - 0.80 x10^3/uL    EOSINOPHIL # 0.00 0.00 - 0.50 x10^3/uL    BASOPHIL # 0.00 0.00 - 0.20 x10^3/uL   URINALYSIS, MACRO/MICRO   Result Value Ref Range    COLOR Red (A) Yellow, Straw, Colorless    APPEARANCE Turbid (A) Clear, Cloudy    SPECIFIC GRAVITY 1.015 1.010 - 1.025    PH 5.0 5.0 - 7.0    LEUKOCYTES Large (A) Negative WBCs/uL    NITRITE Positive (A) Negative    PROTEIN 100  (A) Negative, Trace mg/dL    GLUCOSE Trace (A) Negative mg/dL    KETONES Small (A) Negative mg/dL    UROBILINOGEN 1.0 normal, 0.2 , 1.0 mg/dL    BILIRUBIN Large (A) Negative mg/dL    BLOOD Large (A) Negative mg/dL    URINALYSIS COMMENTS STOP        RBCS TNTC (A) None /hpf    WBCS 50-100 (A) None /hpf    BACTERIA None None /hpf    SQUAMOUS EPITHELIAL Occasional None, Rare, Occasional /hpf   HEPATIC FUNCTION PANEL   Result Value Ref Range    ALBUMIN 2.5 (L) 3.2 -  4.6 g/dL    ALKALINE PHOSPHATASE 477 (H) 20 - 130 U/L    ALT (SGPT) 216 (H) <=52 U/L    AST (SGOT) 957 (H) <=35 U/L    BILIRUBIN TOTAL 0.9 0.3 - 1.2 mg/dL    BILIRUBIN DIRECT 0.5 (H) <=0.3 mg/dL    PROTEIN TOTAL 4.9 (L) 6.0 - 8.3 g/dL   INFLUENZA VIRUS TYPE A AND TYPE B, PCR   Result Value Ref Range    INFLUENZA VIRUS TYPE A Negative Negative    INFLUENZA VIRUS TYPE B Negative Negative   PTT (PARTIAL THROMBOPLASTIN TIME)   Result Value Ref Range    APTT 30.3 25.0 - 37.0 seconds   MANUAL DIFFERENTIAL   Result Value Ref Range    NEUTROPHIL % 63 50 - 70 %    LYMPHOCYTE % 11 (L) 20 - 40 %    MONOCYTE % 7 2 - 11 %    BAND % 11 (H) 0 - 6 %    METAMYELOCYTE % 1 %    MYELOCYTE % 5 %    PROMYELOCYTE % 2 %    NEUTROPHIL ABSOLUTE 4.29 x10^3/uL    LYMPHOCYTE ABSOLUTE 0.64 x10^3/uL    MONOCYTE ABSOLUTE 0.41 x10^3/uL    METAMYELOCYTE ABSOLUTE 0.06 x10^3/uL    MYELOCYTE ABSOLUTE 0.29 x10^3/uL    PROMYELOCYTE ABSOLUTE 0.12 x10^3/uL    NUCLEATED RBC MANUAL 5.0     PLATELET ESTIMATE Adequate     ANISOCYTOSIS 4+     MACROCYTOSIS 1+     POLYCHROMASIA 2+     WBC MORPHOLOGY COMMENT Normal     WBC 5.8 x10^3/uL   ECG 12 LEAD - ED USE   Result Value Ref Range    Heart Rate 114 BPM    PR Interval 169 ms    QRS Duration 88 ms    QT Interval 305 ms    QTC Calculation 421 ms    Calculated P Axis 30 deg    QRS Axis -10 deg    Calculated T Axis 64 deg    I 40 Axis Invalid deg    T 40 Axis 6 deg    ST Axis 80 deg    EKG Severity - BORDERLINE ECG -    POC BLOOD GLUCOSE (RESULTS)   Result Value Ref Range    GLUCOSE, POC 205 (H) 70 - 110 mg/dl   POC BLOOD GLUCOSE (RESULTS)   Result Value Ref Range    GLUCOSE, POC 208 (H) 70 - 110 mg/dl     Labs reviewed and interpreted by me.    Radiology:    Results for orders placed or performed  during the hospital encounter of 06/02/2017   XR AP MOBILE CHEST     Status: None    Narrative    Triton D Ozment    PROCEDURE DESCRIPTION: XR AP MOBILE CHEST    PROCEDURE PERFORMED DATE AND TIME: 06/21/2017 11:05 AM    CLINICAL INDICATION: Cardiac issues    TECHNIQUE: 1 views / 1 images submitted.    Comparison chest x-ray May 14, 2017.    Heart size is normal and there is no infiltrate or pulmonary edema. No  pneumothorax is visualized. A right chest wall port is unchanged in  position.      Impression    No acute process.        Radiologist location ID: [id number]     CT CHEST FOR PULMONARY EMBOLUS W IV CONTRAST     Status: None  Narrative    Roberta D Mckiddy    CT CHEST FOR PULMONARY EMBOLUS W IV CONTRAST performed on 05/31/2017 12:06 PM    INDICATION: 77 years old Male  prostate CA with dyspnea, tachycardia, eval for pe    TECHNIQUE: Axial images from the thoracic inlet through the lungs obtained  after administration of IV contrast with reformatted coronal and sagittal  images, utilizing soft tissue and lung algorithms.    CONTRAST: 60 cc of Optiray 320    RADIATION DOSE (mGy/cm): 296.70 mGy.cm    COMPARISON: No prior cross sectional studies available for comparison.    FINDINGS:  Suboptimal bolus timing limits evaluation of the distal pulmonary vessels.  Filling defects are seen in the right lower lobe subsegmental pulmonary  arteries consistent with emboli. Pulmonary trunk is normal in caliber. No  CT evidence to suggest right heart strain. Mild coronary artery  calcification is noted.    There are multiple abnormally enlarged mediastinal and bilateral hilar  lymph nodes. There are also abnormal retrocrural lymph nodes.    There are multiple subcentimeter indeterminate nodules seen in the right  upper lobe and left lingula. One such nodule on series 3 image 48 measuring  3 mm. Right upper lobe nodule measures 3 mm on series 3 image 16. There are  small bilateral pleural effusions. Mild atelectasis is seen at the  left  lung base.    Innumerable hypodense masses are seen in the liver.    Innumerable sclerotic lesions are seen in the included osseous structures  consistent with metastatic disease.      Impression    1. Right lower lobe subsegmental pulmonary emboli. No CT evidence to  suggest right heart strain.  2. Innumerable masses in the liver, mediastinal adenopathy, and innumerable  sclerotic osseous lesions consistent with metastatic disease. There are  also lateral small pulmonary nodules which are indeterminate. Close  interval follow-up with chest CT is recommended in 3 months to assess  stability of these nodules.  3. Small bilateral pleural effusions.    Findings were discussed via telephone with Dr. Saunders Revel of the emergency  department at 12:27 PM on 06/14/2017.    The CT exam was performed using one or more of the following dose reduction  techniques: automated exposure control, adjustment of the mA and/or kV  according to the patient's size, or use of iterative reconstruction  technique.        Radiologist location ID: [id number]         EKG:  12 lead EKG interpreted by me shows sinus tachycardia. No P wave prolongation. No QRS widening. No ST elevation or depression. Wandering AVF.       Procedures:  None    Orders:  Orders Placed This Encounter   . URINE CULTURE,ROUTINE   . XR AP MOBILE CHEST   . CT CHEST FOR PULMONARY EMBOLUS W IV CONTRAST   . CBC/DIFF   . BASIC METABOLIC PANEL   . TROPONIN-I   . B-TYPE NATRIURETIC PEPTIDE   . PT/INR   . PTT (PARTIAL THROMBOPLASTIN TIME)   . VENOUS BLOOD GAS   . CBC WITH DIFF   . URINALYSIS WITH REFLEX MICROSCOPIC AND CULTURE IF POSITIVE   . URINALYSIS, MACRO/MICRO   . HEPATIC FUNCTION PANEL   . INFLUENZA VIRUS TYPE A AND TYPE B, PCR   . PTT (PARTIAL THROMBOPLASTIN TIME)   . MANUAL DIFFERENTIAL   . PTT (PARTIAL THROMBOPLASTIN TIME)   . ECG 12 LEAD - ED USE   .  OCCULT BLOOD FOR STOOL   . PATIENT CLASS/LEVEL OF CARE DESIGNATION - [place]   . PERIPHERAL VENOUS DUPLEX - LOWER   . ioversol  (OPTIRAY 320) infusion   . NS 10 mL injection   . GI cocktail (ANTACID SUSP, HYOSCYAMINE, LIDOCAINE) oral solution   . heparin 1,000 units/mL initial IV BOLUS   . heparin 25,000 units in D5W 250 mL infusion   . NS bolus infusion 500 mL   . calcium gluconate 100 mg/mL injection   . furosemide (LASIX) 10 mg/mL injection       I am scribing for, and in the presence of Dr. Saunders Revel for services provided on 06/18/2017.  Harrington Challenger, SCRIBE   Harrington Challenger, [place]  06/09/2017, 12:03    I personally performed the services described in this documentation, as scribed  in my presence, and it is both accurate  and complete.    Burke Keels, MD

## 2017-05-24 ENCOUNTER — Inpatient Hospital Stay (HOSPITAL_COMMUNITY): Payer: Medicare Other

## 2017-05-24 ENCOUNTER — Encounter

## 2017-05-24 ENCOUNTER — Inpatient Hospital Stay (HOSPITAL_COMMUNITY): Payer: Medicare Other | Admitting: Radiology

## 2017-05-24 ENCOUNTER — Inpatient Hospital Stay (HOSPITAL_COMMUNITY): Payer: Self-pay

## 2017-05-24 DIAGNOSIS — N1330 Unspecified hydronephrosis: Secondary | ICD-10-CM

## 2017-05-24 DIAGNOSIS — I82402 Acute embolism and thrombosis of unspecified deep veins of left lower extremity: Secondary | ICD-10-CM

## 2017-05-24 DIAGNOSIS — I2699 Other pulmonary embolism without acute cor pulmonale: Secondary | ICD-10-CM

## 2017-05-24 DIAGNOSIS — K7200 Acute and subacute hepatic failure without coma: Secondary | ICD-10-CM

## 2017-05-24 DIAGNOSIS — E875 Hyperkalemia: Secondary | ICD-10-CM

## 2017-05-24 DIAGNOSIS — C61 Malignant neoplasm of prostate: Secondary | ICD-10-CM

## 2017-05-24 DIAGNOSIS — N179 Acute kidney failure, unspecified: Secondary | ICD-10-CM

## 2017-05-24 DIAGNOSIS — N029 Recurrent and persistent hematuria with unspecified morphologic changes: Secondary | ICD-10-CM

## 2017-05-24 LAB — BASIC METABOLIC PANEL
ANION GAP: 15 mmol/L
ANION GAP: 11 mmol/L
BUN/CREA RATIO: 20
BUN/CREA RATIO: 19
BUN: 38 mg/dL — ABNORMAL HIGH (ref 10–25)
BUN: 37 mg/dL — ABNORMAL HIGH (ref 10–25)
CALCIUM: 9.1 mg/dL (ref 8.8–10.3)
CALCIUM: 9.2 mg/dL (ref 8.8–10.3)
CHLORIDE: 93 mmol/L — ABNORMAL LOW (ref 98–111)
CHLORIDE: 95 mmol/L — ABNORMAL LOW (ref 98–111)
CO2 TOTAL: 18 mmol/L — ABNORMAL LOW (ref 21–35)
CO2 TOTAL: 21 mmol/L (ref 21–35)
CREATININE: 1.91 mg/dL — ABNORMAL HIGH (ref <=1.30)
CREATININE: 1.96 mg/dL — ABNORMAL HIGH (ref <=1.30)
ESTIMATED GFR: 35 mL/min/1.73mˆ2 — ABNORMAL LOW
ESTIMATED GFR: 35 mL/min/{1.73_m2} — ABNORMAL LOW
ESTIMATED GFR: 34 mL/min/1.73mˆ2 — ABNORMAL LOW
GLUCOSE: 180 mg/dL — ABNORMAL HIGH (ref 70–110)
GLUCOSE: 233 mg/dL — ABNORMAL HIGH (ref 70–110)
POTASSIUM: 7.1 mmol/L (ref 3.5–5.0)
POTASSIUM: 6.8 mmol/L (ref 3.5–5.0)
SODIUM: 126 mmol/L — ABNORMAL LOW (ref 135–145)
SODIUM: 127 mmol/L — ABNORMAL LOW (ref 135–145)

## 2017-05-24 LAB — CBC WITH DIFF
BASOPHIL #: 0 x10ˆ3/uL (ref 0.00–0.20)
BASOPHIL #: 0 10*3/uL (ref 0.00–0.20)
BASOPHIL #: 0 x10ˆ3/uL (ref 0.00–0.20)
BASOPHIL #: 0 10*3/uL (ref 0.00–0.20)
BASOPHIL %: 1 %
BASOPHIL %: 1 %
BASOPHIL %: 1 %
BASOPHIL %: 1 %
EOSINOPHIL #: 0 x10ˆ3/uL (ref 0.00–0.50)
EOSINOPHIL #: 0 10*3/uL (ref 0.00–0.50)
EOSINOPHIL #: 0 x10ˆ3/uL (ref 0.00–0.50)
EOSINOPHIL #: 0 x10ˆ3/uL (ref 0.00–0.50)
EOSINOPHIL %: 0 %
EOSINOPHIL %: 0 %
EOSINOPHIL %: 0 %
EOSINOPHIL %: 0 %
HCT: 29.9 % — ABNORMAL LOW (ref 38.9–50.5)
HCT: 30.8 % — ABNORMAL LOW (ref 38.9–50.5)
HCT: 29.9 % — ABNORMAL LOW (ref 38.9–50.5)
HCT: 31.2 % — ABNORMAL LOW (ref 38.9–50.5)
HGB: 9.8 g/dL — ABNORMAL LOW (ref 13.4–17.3)
HGB: 9.9 g/dL — ABNORMAL LOW (ref 13.4–17.3)
HGB: 9.9 g/dL — ABNORMAL LOW (ref 13.4–17.3)
HGB: 10.2 g/dL — ABNORMAL LOW (ref 13.4–17.3)
LYMPHOCYTE #: 0.5 x10ˆ3/uL — ABNORMAL LOW (ref 0.80–3.20)
LYMPHOCYTE #: 0.3 10*3/uL — ABNORMAL LOW (ref 0.80–3.20)
LYMPHOCYTE #: 0.3 10*3/uL — ABNORMAL LOW (ref 0.80–3.20)
LYMPHOCYTE #: 0.4 x10ˆ3/uL — ABNORMAL LOW (ref 0.80–3.20)
LYMPHOCYTE %: 12 %
LYMPHOCYTE %: 10 %
LYMPHOCYTE %: 13 %
LYMPHOCYTE %: 13 %
MCH: 32.8 pg (ref 27.9–33.1)
MCH: 32.2 pg (ref 27.9–33.1)
MCH: 32.8 pg (ref 27.9–33.1)
MCH: 32.8 pg (ref 27.9–33.1)
MCHC: 32.6 g/dL — ABNORMAL LOW (ref 32.8–36.0)
MCHC: 32.3 g/dL — ABNORMAL LOW (ref 32.8–36.0)
MCHC: 33 g/dL (ref 32.8–36.0)
MCHC: 32.7 g/dL — ABNORMAL LOW (ref 32.8–36.0)
MCV: 100.6 fL — ABNORMAL HIGH (ref 82.4–95.0)
MCV: 99.5 fL — ABNORMAL HIGH (ref 82.4–95.0)
MCV: 99.3 fL — ABNORMAL HIGH (ref 82.4–95.0)
MCV: 100.4 fL — ABNORMAL HIGH (ref 82.4–95.0)
MONOCYTE #: 0.3 x10ˆ3/uL (ref 0.20–0.80)
MONOCYTE #: 0.2 10*3/uL (ref 0.20–0.80)
MONOCYTE #: 0.2 x10ˆ3/uL (ref 0.20–0.80)
MONOCYTE #: 0.2 x10ˆ3/uL (ref 0.20–0.80)
MONOCYTE %: 7 %
MONOCYTE %: 6 %
MONOCYTE %: 8 %
MONOCYTE %: 5 %
MPV: 7.5 fL (ref 6.0–10.2)
MPV: 7.1 fL (ref 6.0–10.2)
MPV: 7.7 fL (ref 6.0–10.2)
MPV: 7.4 fL (ref 6.0–10.2)
NEUTROPHIL #: 3.2 10*3/uL (ref 1.60–5.50)
NEUTROPHIL #: 2.7 10*3/uL (ref 1.60–5.50)
NEUTROPHIL #: 1.9 10*3/uL (ref 1.60–5.50)
NEUTROPHIL #: 2.6 x10ˆ3/uL (ref 1.60–5.50)
NEUTROPHIL %: 80 %
NEUTROPHIL %: 84 %
NEUTROPHIL %: 79 %
NEUTROPHIL %: 81 %
PLATELETS: 76 x10ˆ3/uL — ABNORMAL LOW (ref 140–440)
PLATELETS: 71 10*3/uL — ABNORMAL LOW (ref 140–440)
PLATELETS: 63 10*3/uL — ABNORMAL LOW (ref 140–440)
PLATELETS: 63 x10?3/uL — ABNORMAL LOW (ref 140–440)
PLATELETS: 52 x10ˆ3/uL — ABNORMAL LOW (ref 140–440)
RBC: 2.98 10*6/uL — ABNORMAL LOW (ref 4.40–5.68)
RBC: 3.09 10*6/uL — ABNORMAL LOW (ref 4.40–5.68)
RBC: 3.01 x10ˆ6/uL — ABNORMAL LOW (ref 4.40–5.68)
RBC: 3.1 10*6/uL — ABNORMAL LOW (ref 4.40–5.68)
RDW: 20.9 % — ABNORMAL HIGH (ref 10.9–15.1)
RDW: 20.3 % — ABNORMAL HIGH (ref 10.9–15.1)
RDW: 20.8 % — ABNORMAL HIGH (ref 10.9–15.1)
RDW: 21.7 % — ABNORMAL HIGH (ref 10.9–15.1)
WBC: 4 10*3/uL (ref 3.3–9.3)
WBC: 3.2 10*3/uL — ABNORMAL LOW (ref 3.3–9.3)
WBC: 2.4 10*3/uL — ABNORMAL LOW (ref 3.3–9.3)
WBC: 3.2 x10ˆ3/uL — ABNORMAL LOW (ref 3.3–9.3)

## 2017-05-24 LAB — SODIUM, RANDOM URINE: SODIUM RANDOM URINE: 68 mmol/L

## 2017-05-24 LAB — COMPREHENSIVE METABOLIC PANEL, NON-FASTING
ALBUMIN: 2.4 g/dL — ABNORMAL LOW (ref 3.2–4.6)
ALKALINE PHOSPHATASE: 787 U/L — ABNORMAL HIGH (ref 20–130)
ALT (SGPT): 1335 U/L — ABNORMAL HIGH (ref <=52)
ANION GAP: 9 mmol/L
AST (SGOT): 8629 U/L — ABNORMAL HIGH (ref <=35)
BILIRUBIN TOTAL: 2.2 mg/dL — ABNORMAL HIGH (ref 0.3–1.2)
BUN/CREA RATIO: 19
BUN: 36 mg/dL — ABNORMAL HIGH (ref 10–25)
CALCIUM: 8.7 mg/dL — ABNORMAL LOW (ref 8.8–10.3)
CHLORIDE: 96 mmol/L — ABNORMAL LOW (ref 98–111)
CO2 TOTAL: 21 mmol/L (ref 21–35)
CREATININE: 1.85 mg/dL — ABNORMAL HIGH (ref <=1.30)
ESTIMATED GFR: 36 mL/min/1.73mˆ2 — ABNORMAL LOW
GLUCOSE: 250 mg/dL — ABNORMAL HIGH (ref 70–110)
POTASSIUM: 6.6 mmol/L (ref 3.5–5.0)
PROTEIN TOTAL: 4.7 g/dL — ABNORMAL LOW (ref 6.0–8.3)
SODIUM: 126 mmol/L — ABNORMAL LOW (ref 135–145)

## 2017-05-24 LAB — MANUAL DIFFERENTIAL
BAND %: 8 % — ABNORMAL HIGH (ref 0–6)
BAND %: 8 % — ABNORMAL HIGH (ref 0–6)
BAND %: 10 % — ABNORMAL HIGH (ref 0–6)
BAND %: 17 % — ABNORMAL HIGH (ref 0–6)
BASOPHIL %: 1 % (ref 0–2)
BASOPHIL ABSOLUTE: 0.03 x10ˆ3/uL
LYMPHOCYTE %: 12 % — ABNORMAL LOW (ref 20–40)
LYMPHOCYTE %: 13 % — ABNORMAL LOW (ref 20–40)
LYMPHOCYTE %: 14 % — ABNORMAL LOW (ref 20–40)
LYMPHOCYTE %: 22 % (ref 20–40)
LYMPHOCYTE ABSOLUTE: 0.48 10*3/uL
LYMPHOCYTE ABSOLUTE: 0.42 x10ˆ3/uL
LYMPHOCYTE ABSOLUTE: 0.34 x10ˆ3/uL
LYMPHOCYTE ABSOLUTE: 0.7 x10ˆ3/uL
METAMYELOCYTE %: 4 %
METAMYELOCYTE %: 4 %
METAMYELOCYTE %: 7 %
METAMYELOCYTE %: 7 %
METAMYELOCYTE ABSOLUTE: 0.16 10*3/uL
METAMYELOCYTE ABSOLUTE: 0.13 x10ˆ3/uL
METAMYELOCYTE ABSOLUTE: 0.17 x10ˆ3/uL
MONOCYTE %: 5 % (ref 2–11)
MONOCYTE %: 11 % (ref 2–11)
MONOCYTE %: 7 % (ref 2–11)
MONOCYTE %: 6 % (ref 2–11)
MONOCYTE ABSOLUTE: 0.2 10*3/uL
MONOCYTE ABSOLUTE: 0.35 x10ˆ3/uL
MONOCYTE ABSOLUTE: 0.17 x10ˆ3/uL
MONOCYTE ABSOLUTE: 0.19 x10ˆ3/uL
MYELOCYTE %: 2 %
MYELOCYTE %: 2 %
MYELOCYTE %: 3 %
MYELOCYTE ABSOLUTE: 0.08 10*3/uL
MYELOCYTE ABSOLUTE: 0.06 x10ˆ3/uL
MYELOCYTE ABSOLUTE: 0.07 x10ˆ3/uL
NEUTROPHIL %: 69 % (ref 50–70)
NEUTROPHIL %: 59 % (ref 50–70)
NEUTROPHIL %: 50 % (ref 50–70)
NEUTROPHIL %: 72 % — ABNORMAL HIGH (ref 50–70)
NEUTROPHIL ABSOLUTE: 3.08 10*3/uL
NEUTROPHIL ABSOLUTE: 2.21 x10ˆ3/uL
NEUTROPHIL ABSOLUTE: 1.61 10*3/uL
NEUTROPHIL ABSOLUTE: 1.61 x10ˆ3/uL
NEUTROPHIL ABSOLUTE: 2.3 x10ˆ3/uL
NUCLEATED RBC MANUAL: 12
NUCLEATED RBC MANUAL: 35
NUCLEATED RBC MANUAL: 23
NUCLEATED RBC MANUAL: 22
PLATELET ESTIMATE: DECREASED
PLATELET ESTIMATE: DECREASED
PLATELET ESTIMATE: DECREASED
PLATELET ESTIMATE: DECREASED
PROMYELOCYTE %: 2 %
PROMYELOCYTE ABSOLUTE: 0.05 x10ˆ3/uL
RBC MORPHOLOGY COMMENT: NORMAL
WBC MORPHOLOGY COMMENT: NORMAL
WBC MORPHOLOGY COMMENT: NORMAL
WBC MORPHOLOGY COMMENT: NORMAL
WBC: 4 10*3/uL
WBC: 3.2 x10ˆ3/uL
WBC: 2.4 x10ˆ3/uL
WBC: 3.2 x10ˆ3/uL

## 2017-05-24 LAB — ECG 12 LEAD - ADULT
Calculated P Axis: 262 deg
Calculated P Axis: 39 deg
Calculated T Axis: 26 deg
Calculated T Axis: 54 deg
EKG Severity: ABNORMAL
EKG Severity: BORDERLINE
Heart Rate: 118 {beats}/min
Heart Rate: 97 {beats}/min
I 40 Axis: 22 deg
I 40 Axis: 5 deg
PR Interval: 171 ms
PR Interval: 185 ms
QRS Axis: -47 deg
QRS Axis: 222 deg
QRS Duration: 113 ms
QRS Duration: 132 ms
QT Interval: 310 ms
QT Interval: 364 ms
QTC Calculation: 435 ms
QTC Calculation: 463 ms
ST Axis: -26 deg
ST Axis: 186 deg
T 40 Axis: 205 deg
T 40 Axis: 205 deg
T 40 Axis: 261 deg

## 2017-05-24 LAB — BASIC METABOLIC PANEL, FASTING
ANION GAP: 10 mmol/L
BUN/CREA RATIO: 19
BUN: 37 mg/dL — ABNORMAL HIGH (ref 10–25)
CALCIUM: 9.2 mg/dL (ref 8.8–10.3)
CHLORIDE: 94 mmol/L — ABNORMAL LOW (ref 98–111)
CO2 TOTAL: 20 mmol/L — ABNORMAL LOW (ref 21–35)
CREATININE: 2 mg/dL — ABNORMAL HIGH (ref <=1.30)
ESTIMATED GFR: 33 mL/min/1.73mˆ2 — ABNORMAL LOW
GLUCOSE: 327 mg/dL — ABNORMAL HIGH (ref 70–110)
POTASSIUM: 7.2 mmol/L (ref 3.5–5.0)
SODIUM: 124 mmol/L — ABNORMAL LOW (ref 135–145)

## 2017-05-24 LAB — LACTIC ACID LEVEL
LACTIC ACID: 7.3 mmol/L (ref <2.0)
LACTIC ACID: 6.1 mmol/L (ref <2.0)
LACTIC ACID: 7.2 mmol/L (ref <2.0)

## 2017-05-24 LAB — PROCALCITONIN: PROCALCITONIN: 1.4 ng/mL — ABNORMAL HIGH (ref <0.50)

## 2017-05-24 LAB — VENOUS BLOOD GAS
%FIO2 (VENOUS): 55 %
BASE DEFICIT: 13.6 mmol/L — ABNORMAL HIGH (ref <=3.0)
BICARBONATE (VENOUS): 13.6 mmol/L — ABNORMAL LOW (ref 23.0–33.0)
O2 SATURATION (VENOUS): 72.4 % — ABNORMAL LOW (ref 95.0–98.0)
PCO2 (VENOUS): 54 mmHg — ABNORMAL HIGH (ref 38.00–50.00)
PH (VENOUS): 7.09 — CL (ref 7.32–7.45)
PO2 (VENOUS): 52 mm/Hg — ABNORMAL HIGH (ref 30.0–50.0)
PO2 (VENOUS): 52 mmHg — ABNORMAL HIGH (ref 30.0–50.0)

## 2017-05-24 LAB — POC BLOOD GLUCOSE (RESULTS)
GLUCOSE, POC: 261 mg/dl — ABNORMAL HIGH (ref 70–110)
GLUCOSE, POC: 204 mg/dl — ABNORMAL HIGH (ref 70–110)

## 2017-05-24 LAB — OSMOLALITY, RANDOM URINE: OSMOLALITY URINE: 306 mOsm/kg (ref 250–900)

## 2017-05-24 LAB — ECG 12 LEAD - ED USE
Calculated P Axis: 30 deg
Calculated T Axis: 64 deg
EKG Severity: BORDERLINE
Heart Rate: 114 {beats}/min
I 40 Axis: INVALID deg
PR Interval: 169 ms
QRS Axis: -10 deg
QRS Duration: 88 ms
QT Interval: 305 ms
QTC Calculation: 421 ms
ST Axis: 80 deg
T 40 Axis: 6 deg

## 2017-05-24 LAB — TROPONIN-I: TROPONIN I: 0.08 ng/mL (ref <=0.04)

## 2017-05-24 LAB — PTT (PARTIAL THROMBOPLASTIN TIME)
APTT: 72.9 seconds — ABNORMAL HIGH (ref 25.0–37.0)
APTT: 41.3 s — ABNORMAL HIGH (ref 25.0–37.0)
APTT: 55 seconds — ABNORMAL HIGH (ref 25.0–37.0)

## 2017-05-24 LAB — PT/INR
INR: 1.76 — ABNORMAL HIGH (ref 0.80–1.10)
INR: 1.81 — ABNORMAL HIGH (ref 0.80–1.10)

## 2017-05-24 LAB — THYROID STIMULATING HORMONE WITH FREE T4 REFLEX: TSH: 2.937 u[IU]/mL (ref 0.450–5.330)

## 2017-05-24 LAB — CREATININE URINE, RANDOM: CREATININE RANDOM URINE: 41 mg/dL

## 2017-05-24 LAB — B-TYPE NATRIURETIC PEPTIDE: BNP: 76 pg/mL (ref 0–100)

## 2017-05-24 LAB — MAGNESIUM: MAGNESIUM: 2.5 mg/dL — ABNORMAL HIGH (ref 1.8–2.3)

## 2017-05-24 MED ORDER — SODIUM POLYSTYRENE SULFONATE 15 GRAM-SORBITOL 20 GRAM/60 ML ORAL SUSP
45.0000 g | ORAL | Status: AC
Start: 2017-05-24 — End: 2017-05-24
  Filled 2017-05-24: qty 180

## 2017-05-24 MED ORDER — HEPARIN 1,000 UNITS/ML BOLUS FOR DOSE ADJUSTMENT
5.0000 mL | Freq: Once | INTRAMUSCULAR | Status: AC
Start: 2017-05-24 — End: 2017-05-24
  Administered 2017-05-24: 5000 [IU] via INTRAVENOUS
  Filled 2017-05-24: qty 10

## 2017-05-24 MED ORDER — ACETAMINOPHEN 325 MG TABLET
325.00 mg | ORAL_TABLET | Freq: Four times a day (QID) | ORAL | Status: DC | PRN
Start: 2017-05-24 — End: 2017-05-24

## 2017-05-24 MED ORDER — PANTOPRAZOLE 40 MG INTRAVENOUS SOLUTION
40.0000 mg | Freq: Every day | INTRAVENOUS | Status: DC
Start: 2017-05-24 — End: 2017-05-24
  Administered 2017-05-24: 40 mg via INTRAVENOUS
  Filled 2017-05-24 (×2): qty 10

## 2017-05-24 MED ORDER — INSULIN LISPRO 100 UNIT/ML SUBCUTANEOUS SSIP - ~~LOC~~/GRMC
1.0000 [IU] | Freq: Four times a day (QID) | SUBCUTANEOUS | Status: DC
Start: 2017-05-24 — End: 2017-05-24
  Administered 2017-05-24: 8 [IU] via SUBCUTANEOUS
  Administered 2017-05-24: 6 [IU] via SUBCUTANEOUS
  Filled 2017-05-24: qty 300

## 2017-05-24 MED ORDER — HEPARIN (PORCINE) 25,000 UNIT/250 ML (100 UNIT/ML) IN DEXTROSE 5 % IV
10.0000 [IU]/kg/h | INTRAVENOUS | Status: DC
Start: 2017-05-24 — End: 2017-05-24
  Administered 2017-05-24 (×8): 10 [IU]/kg/h via INTRAVENOUS
  Filled 2017-05-24: qty 250

## 2017-05-24 MED ORDER — SODIUM BICARBONATE 1 MEQ/ML (8.4 %) INTRAVENOUS SOLUTION
INTRAVENOUS | Status: DC
Start: 2017-05-24 — End: 2017-05-24
  Filled 2017-05-24: qty 1000

## 2017-05-24 MED ORDER — CEFEPIME 1G IN NS 50ML MINIBAG IVPB
1.00 g | Freq: Two times a day (BID) | INTRAMUSCULAR | Status: DC
Start: 2017-05-24 — End: 2017-05-24
  Filled 2017-05-24 (×2): qty 50

## 2017-05-24 MED ORDER — SODIUM CHLORIDE 0.9 % (FLUSH) INJECTION SYRINGE
2.00 mL | INJECTION | INTRAMUSCULAR | Status: DC | PRN
Start: 2017-05-24 — End: 2017-05-24

## 2017-05-24 MED ORDER — HEPARIN 1,000 UNITS/ML BOLUS FOR DOSE ADJUSTMENT
3.2000 [IU] | Freq: Once | INTRAMUSCULAR | Status: DC
Start: 2017-05-24 — End: 2017-05-24
  Administered 2017-05-24: 0 [IU] via INTRAVENOUS
  Filled 2017-05-24: qty 10

## 2017-05-24 MED ORDER — CEFEPIME 2G IN NS 50ML MINIBAG IVPB
2.0000 g | Freq: Once | INTRAMUSCULAR | Status: AC
Start: 2017-05-24 — End: 2017-05-24
  Administered 2017-05-24: 2 g via INTRAVENOUS
  Administered 2017-05-24: 0 g via INTRAVENOUS
  Filled 2017-05-24: qty 50

## 2017-05-24 MED ORDER — METHYLPREDNISOLONE SOD SUCCINATE 40 MG/ML SOLUTION FOR INJ. WRAPPER
40.0000 mg | Freq: Every day | INTRAMUSCULAR | Status: DC
Start: 2017-05-24 — End: 2017-05-24
  Administered 2017-05-24: 40 mg via INTRAVENOUS
  Filled 2017-05-24 (×2): qty 1

## 2017-05-24 MED ORDER — ALUMINUM-MAG HYDROXIDE-SIMETHICONE 200 MG-200 MG-20 MG/5 ML ORAL SUSP
20.00 mL | Freq: Four times a day (QID) | ORAL | Status: DC | PRN
Start: 2017-05-24 — End: 2017-05-24

## 2017-05-24 MED ORDER — DEXTROSE 50 % IN WATER (D50W) INTRAVENOUS SYRINGE
25.0000 g | INJECTION | Freq: Once | INTRAVENOUS | Status: AC
Start: 2017-05-24 — End: 2017-05-24
  Administered 2017-05-24: 50 mL via INTRAVENOUS
  Filled 2017-05-24: qty 50

## 2017-05-24 MED ORDER — INSULIN LISPRO (U-100) 100 UNIT/ML SUBCUTANEOUS SOLUTION
14.0000 [IU] | SUBCUTANEOUS | Status: DC
Start: 2017-05-24 — End: 2017-05-24
  Administered 2017-05-24: 0 [IU] via SUBCUTANEOUS
  Filled 2017-05-24: qty 3

## 2017-05-24 MED ORDER — INSULIN LISPRO (U-100) 100 UNIT/ML SUBCUTANEOUS SOLUTION
10.0000 [IU] | SUBCUTANEOUS | Status: AC
Start: 2017-05-24 — End: 2017-05-24
  Administered 2017-05-24: 10 [IU] via SUBCUTANEOUS
  Filled 2017-05-24: qty 3

## 2017-05-24 MED ORDER — DEXTROSE 50 % IN WATER (D50W) INTRAVENOUS SYRINGE
25.0000 g | INJECTION | Freq: Once | INTRAVENOUS | Status: DC
Start: 2017-05-24 — End: 2017-05-24
  Administered 2017-05-24: 07:00:00 0 mL via INTRAVENOUS

## 2017-05-24 MED ORDER — DEXTROSE 5 % AND 0.45 % SODIUM CHLORIDE INTRAVENOUS SOLUTION
INTRAVENOUS | Status: DC
Start: 2017-05-24 — End: 2017-05-24
  Filled 2017-05-24 (×2): qty 1000

## 2017-05-24 MED ORDER — INSULIN LISPRO (U-100) 100 UNIT/ML SUBCUTANEOUS SOLUTION
10.0000 [IU] | SUBCUTANEOUS | Status: DC
Start: 2017-05-24 — End: 2017-05-24

## 2017-05-24 MED ORDER — SODIUM POLYSTYRENE SULFONATE 15 GRAM-SORBITOL 20 GRAM/60 ML ORAL SUSP
45.0000 g | ORAL | Status: DC
Start: 2017-05-24 — End: 2017-05-24
  Administered 2017-05-24: 0 g via ORAL
  Filled 2017-05-24: qty 180

## 2017-05-24 MED ORDER — DEXTROSE 50 % IN WATER (D50W) INTRAVENOUS SYRINGE
25.0000 g | INJECTION | INTRAVENOUS | Status: DC | PRN
Start: 2017-05-24 — End: 2017-05-24

## 2017-05-24 MED ADMIN — sodium polystyrene sulfonate 15 gram-sorbitoL 20 gram/60 mL oral susp: ORAL | @ 01:00:00

## 2017-05-24 MED ADMIN — dexmedeTOMIDine 100 mcg/mL intravenous solution: @ 08:00:00

## 2017-05-24 MED ADMIN — dextrose 50 % in water (D50W) intravenous syringe: INTRAVENOUS | @ 07:00:00

## 2017-05-24 MED ADMIN — mupirocin 2 % topical ointment: @ 14:00:00 | NDC 51672131200

## 2017-05-24 MED ADMIN — lactated Ringers intravenous solution: INTRAVENOUS | @ 12:00:00 | NDC 00264775000

## 2017-05-25 DIAGNOSIS — E872 Acidosis, unspecified: Secondary | ICD-10-CM | POA: Diagnosis present

## 2017-05-25 DIAGNOSIS — K7200 Acute and subacute hepatic failure without coma: Secondary | ICD-10-CM | POA: Diagnosis present

## 2017-05-25 LAB — CROSSMATCH RED CELLS - UNITS
UNIT DIVISION: 0
UNIT DIVISION: 0

## 2017-05-25 LAB — TYPE AND SCREEN
ABO/RH(D): A POS
ANTIBODY SCREEN: NEGATIVE
UNITS ORDERED: 2

## 2017-05-25 LAB — URINE CULTURE,ROUTINE: URINE CULTURE: NO GROWTH

## 2017-05-25 NOTE — Discharge Summary (Addendum)
[place]      PATIENT NAME:  Joe Carroll, Joe Carroll  MRN:  [id number]  DOB:  1941-01-19      ENCOUNTER START DATE: 06/08/2017  INPATIENT ADMISSION DATE: 06/12/2017    DISCHARGE DATE:  06/11/17    ATTENDING PHYSICIAN: No att. providers found  PRIMARY CARE PHYSICIAN: Prescilla Sours, MD     ADMISSION DIAGNOSIS: Pulmonary emboli (CMS [facility])  Chief Complaint   Patient presents with   . Weight Gain     Pt states he was dx'd with A Fib 6 months ago. Curretnly has prostate cancer with mets to the bone. Presents today with fluid retention, tachycardia, and shortness of breath.            DISCHARGE DIAGNOSIS:   Hospital Problems) (* Primary Problem)    Diagnosis Date Noted   . *Pulmonary emboli (CMS HCC) 06/07/2017   . Metabolic acidosis 25/06/3974   . Septic shock (CMS HCC) 06/12/2017   . Lactic acidosis 05/25/2017   . Acute liver failure without hepatic coma 05/25/2017   . 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 Status post placement of ureteral stent 06/12/2017     The left side     . Hydronephrosis of left kidney 06/03/2017     Status post stent     . Prostate cancer metastatic to bone (CMS HCC) 06/15/2017   . Dizziness 06/02/2017   . Elevated liver enzymes 06/19/2017   . Liver masses 05/28/2017   . Hyponatremia 05/14/2017   . Syncope 05/14/2017   . Paroxysmal atrial fibrillation (CMS HCC) 05/14/2017   . Urinary retention due to benign prostatic hyperplasia 05/14/2017   . Diabetes (CMS [facility]) 05/14/2017      Resolved Hospital Problems   No resolved problems to display.     There are no active non-hospital problems to display for this patient.       DISCHARGE MEDICATIONS:none    DISCHARGE INSTRUCTIONS:   No discharge procedures on file.      REASON FOR HOSPITALIZATION AND HOSPITAL COURSE:  This is a 77 y.o., male who presents with bilateral lower extremity swelling worse for about 2 days. He also had elevated blood sugar of 320 at home today. He got concerned and presented to the ER. Patient has recent  hospital stay for atrial fibrillation, orthostatic hypotension and tachycardia recently. He was started on amiodarone and small dose of beta-blocker and discharged about 2 weeks prior. He said he was doing fairly okay at home but has been having some shortness of breath with exertion and elevated heart rate.   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 He had left hydronephrosis and had left ureteral stent placed in September 2018. 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 He has paroxysmal AFib and was given Eliquis but had recurrent hematuria so that was stopped. He was also treated with Lupron every 3 months. He was getting active chemotherapy up until recently at the [facility] in [place].  Was diagnosed with acute right lower lobe pulmonary embolism with a CT scan which also showed multiple liver nodules, new finding.  Patient had mildly elevated potassium at 5.5 hand was apparently treated in the ER by verbal report.  Patient had low sodium at 125.Marland Kitchen  He was given gentle normal saline and placed  on heparin drip.  Later on in the evening became hypoxic and hypotensive, likely due to septic shock, repeat labs show potassium at 7.2.  He was given broad-spectrum antibiotics.  Cultures are growing coagulase-negative Staph in the urine.  Unclear if this is true infection or colonization.  Likely sources genitourinary tract.  Insulin D50, calcium and Kayexalate given and transferred to ICU.  Repeat potassium was 6.8.  Patient worsening acidosis which was likely the cause for hyperkalemia and lactate was 6.1.  The patient was started on CRRT through right groin Trialysis catheter for hyperkalemia.  No EKG changes were seen.  Patient had decrease in urine output.  Over the day his labs continued to  worsen with severe lactic acidosis likely from acute liver failure.  He was kept on Neo-Synephrine pressors and around 8:00 p.m. Became bradycardic with a further drop in blood pressure at which time family did not want to start a 2nd pressor.  He was pronounced dead at around 8:07 p.m. On 2017-06-08.  Family was at the bedside.  No autopsy was requested.    DOES PATIENT HAVE ADVANCED DIRECTIVES:  No, Information Offered and Given    ADVANCED CARE PLANNING - Not applicable for this patient    CONDITION ON DISCHARGE: Deceased    DISCHARGE DISPOSITION:  Body released to the morgue    Copies sent to Care Team       Relationship Specialty Notifications Start End    Prescilla Sours, MD PCP - General FAMILY PRACTICE  05/14/17     Phone: [phone] Fax: [phone]         [address]  [place] [address]            Dorathy Kinsman, MD

## 2017-06-12 DIAGNOSIS — R6521 Severe sepsis with septic shock: Secondary | ICD-10-CM | POA: Diagnosis present

## 2017-06-12 DIAGNOSIS — A419 Sepsis, unspecified organism: Secondary | ICD-10-CM | POA: Diagnosis present

## 2017-06-22 NOTE — Care Plan (Signed)
Patient remains on CRRT, tolerating well. Titration of Neo to keep MAP >65. Lung sounds much improved through shift. Inadequate foley urine output. Trialysis and right chest port patent.  Lower extremity pulses weakly palpable, cool, and pale/dusky in color. Patient has no complaints of discomfort at this time.  Leatrice Jewels, RN  Jun 08, 2017, 18:37

## 2017-06-22 NOTE — Progress Notes (Signed)
[facility]  Hospitalist Progress Note    Joe Carroll       77 y.o.       Date of service: 05/26/17  Date of Admission:  06/07/2017    Hospital Day:  LOS: 1 day   CC: Pulmonary emboli (CMS HCC)    Subjective:  Patient transferred ICU over night for hypoxia and hypotension.  Now on Neo-Synephrine.  Alert awake and follows requests.  Does not complain of any pain.    Potassium repeated at 7.2 so Trialysis catheter placed.  Medical treatment did not work.  Patient appears slightly drowsy but easily arousable.  Objective:   Filed Vitals:    2017-05-26 0915 May 26, 2017 0930 May 26, 2017 0945 05-26-17 1000   BP: (!) 114/40 (!) 108/24 (!) 116/33 (!) 68/55   Pulse: 95 92 91 90   Resp: (!) 30 (!) 26 (!) 26 (!) 29   Temp:    35.9 C (96.6 F)   SpO2: 100% 100% 100% 100%     O2 delivery: None (Room Air)     I have reviewed the vitals.      Input/Output    Intake/Output Summary (Last 24 hours) at 2017/05/26 1012  Last data filed at 05-26-17 0900  Gross per 24 hour   Intake 2672 ml   Output 245 ml   Net 2427 ml           Current Facility-Administered Medications:  acetaminophen (TYLENOL) tablet 650 mg Oral Q6H PRN   aluminum-magnesium hydroxide-simethicone (MAG-AL PLUS) 200-200-20 mg per 5 mL oral liquid 20 mL Oral Q4H PRN   amiodarone (CORDARONE) tablet 200 mg Oral 2x/day   aspirin chewable tablet 81 mg 81 mg Oral NIGHTLY   calcium carbonate-vitamin D3 1250mg  (500mg )-200 units per tablet 2 Tab Oral 2x/day   ceFEPime (MAXIPIME) 1g in NS 5mL IVPB minibag 1 g Intravenous Q12H   ceFEPime (MAXIPIME) 2g in NS 76mL IVPB minibag 2 g Intravenous Once   D5W 1/2 NS 1,000 mL with sodium bicarbonate 50 mEq infusion  Intravenous Continuous   dextrose 50% (0.5 g/mL) injection - syringe 25 g Intravenous Once   dextrose 50% (0.5 g/mL) injection - syringe 25 g Intravenous Q15 Min PRN   heparin 1,000 units/mL injection IV bolus 100 Units Intravenous Once   heparin 25,000 units in D5W 250 mL infusion 10 Units/kg/hr (Adjusted) Intravenous  Continuous   insulin lispro (HUMALOG) 100 units/mL injection 14 Units Subcutaneous Now   meclizine (ANTIVERT) tablet 12.5 mg Oral 3x/day PRN   metoclopramide (REGLAN) 5 mg/mL injection 10 mg Intravenous Q6H PRN   metoprolol tartrate (LOPRESSOR) tablet 12.5 mg Oral 2x/day   multivitamin with iron and minerals tablet 1 Tab Oral Daily   NS 10 mL injection 10 mL Intravenous Give in Radiology   NS flush syringe 3 mL Intracatheter Q8HRS   NS flush syringe 3 mL Intracatheter Q1H PRN   NS flush syringe 2-6 mL Intracatheter Q1H PRN   phenylephrine (NEO-SYNEPHRINE) 10 mg in NS 250 mL infusion 50 mcg/min Intravenous Continuous   sodium polystyrene sulfonate (KAYEXALATE) 15 g per 60 mL oral liquid - with sorbitol 45 g Oral Now   SSIP insulin lispro (HUMALOG) 100 units/mL injection 1-12 Units Subcutaneous 4x/day AC       Physical Exam:  General:  Drowsy, arousable not in acute distress   HEENT: Oropharynx is dry  Cardiac:  S1-S2 tachycardia  Respiratory:  Clear bilateral.  Abdomen:  Nontender, nondistended Positive bowel sounds,  Extremities:  2+ peripheral  edema noted on exam bilateral  CNS-pupils equal reactive, moves all extremities, nonfocal; positive nystagmus    Labs:   Results for orders placed or performed during the hospital encounter of 06/19/2017 (from the past 24 hour(s))   URINE CULTURE,ROUTINE   Result Value Ref Range    URINE CULTURE No Growth 18-24 hrs.    CBC/DIFF    Narrative    The following orders were created for panel order CBC/DIFF.  Procedure                               Abnormality         Status                     ---------                               -----------         ------                     CBC WITH YFVC[[id number]]                Abnormal            Final result               MANUAL DIFFERENTIAL[[id number]]          Abnormal            Final result                 Please view results for these tests on the individual orders.   BASIC METABOLIC PANEL   Result Value Ref Range    SODIUM 125 (L) 135  - 145 mmol/L    POTASSIUM 5.5 (H) 3.5 - 5.0 mmol/L    CHLORIDE 92 (L) 98 - 111 mmol/L    CO2 TOTAL 16 (L) 21 - 35 mmol/L    ANION GAP 17 mmol/L    CALCIUM 9.4 8.8 - 10.3 mg/dL    GLUCOSE 235 (H) 70 - 110 mg/dL    BUN 30 (H) 10 - 25 mg/dL    CREATININE 1.12 <=1.30 mg/dL    BUN/CREA RATIO 27     ESTIMATED GFR >60 Avg: 75 mL/min/1.57m^2   TROPONIN-I   Result Value Ref Range    TROPONIN I 0.04 <=0.04 ng/mL   B-TYPE NATRIURETIC PEPTIDE   Result Value Ref Range    BNP 51 0 - 100 pg/mL   PT/INR   Result Value Ref Range    INR 1.25 (H) 0.80 - 1.10   PTT (PARTIAL THROMBOPLASTIN TIME)   Result Value Ref Range    APTT 29.8 25.0 - 37.0 seconds   VENOUS BLOOD GAS   Result Value Ref Range    %FIO2 (VENOUS) 21.0 %    BICARBONATE (VENOUS) 17.5 (L) 23.0 - 33.0 mmol/L    PCO2 (VENOUS) 30.00 (L) 38.00 - 50.00 mm/Hg    PH (VENOUS) 7.33 7.32 - 7.45    PO2 (VENOUS) 45.0 30.0 - 50.0 mm/Hg    BASE DEFICIT 8.8 (H) -2.0 - 3.0 mmol/L    O2 SATURATION (VENOUS) 64.5 (L) 95.0 - 98.0 %   CBC WITH DIFF   Result Value Ref Range    WBC 5.8 3.3 - 9.3 x10^3/uL    RBC 2.86 (L) 4.40 - 5.68 x10^6/uL  HGB 9.5 (L) 13.4 - 17.3 g/dL    HCT 29.1 (L) 38.9 - 50.5 %    MCV 101.8 (H) 82.4 - 95.0 fL    MCH 33.2 (H) 27.9 - 33.1 pg    MCHC 32.6 (L) 32.8 - 36.0 g/dL    RDW 22.5 (H) 10.9 - 15.1 %    PLATELETS 163 140 - 440 x10^3/uL    MPV 7.5 6.0 - 10.2 fL    NEUTROPHIL % 81 %    LYMPHOCYTE % 9 %    MONOCYTE % 10 %    EOSINOPHIL % 0 %    BASOPHIL % 0 %    NEUTROPHIL # 4.20 1.60 - 5.50 x10^3/uL    LYMPHOCYTE # 0.40 (L) 0.80 - 3.20 x10^3/uL    MONOCYTE # 0.50 0.20 - 0.80 x10^3/uL    EOSINOPHIL # 0.00 0.00 - 0.50 x10^3/uL    BASOPHIL # 0.00 0.00 - 0.20 x10^3/uL   URINALYSIS WITH REFLEX MICROSCOPIC AND CULTURE IF POSITIVE    Narrative    The following orders were created for panel order URINALYSIS WITH REFLEX MICROSCOPIC AND CULTURE IF POSITIVE.  Procedure                               Abnormality         Status                     ---------                                -----------         ------                     URINALYSIS, MACRO/MICRO[[id number]]      Abnormal            Final result                 Please view results for these tests on the individual orders.   URINALYSIS, MACRO/MICRO   Result Value Ref Range    COLOR Red (A) Yellow, Straw, Colorless    APPEARANCE Turbid (A) Clear, Cloudy    SPECIFIC GRAVITY 1.015 1.010 - 1.025    PH 5.0 5.0 - 7.0    LEUKOCYTES Large (A) Negative WBCs/uL    NITRITE Positive (A) Negative    PROTEIN 100  (A) Negative, Trace mg/dL    GLUCOSE Trace (A) Negative mg/dL    KETONES Small (A) Negative mg/dL    UROBILINOGEN 1.0 normal, 0.2 , 1.0 mg/dL    BILIRUBIN Large (A) Negative mg/dL    BLOOD Large (A) Negative mg/dL    URINALYSIS COMMENTS STOP     RBCS TNTC (A) None /hpf    WBCS 50-100 (A) None /hpf    BACTERIA None None /hpf    SQUAMOUS EPITHELIAL Occasional None, Rare, Occasional /hpf   HEPATIC FUNCTION PANEL   Result Value Ref Range    ALBUMIN 2.5 (L) 3.2 - 4.6 g/dL    ALKALINE PHOSPHATASE 477 (H) 20 - 130 U/L    ALT (SGPT) 216 (H) <=52 U/L    AST (SGOT) 957 (H) <=35 U/L    BILIRUBIN TOTAL 0.9 0.3 - 1.2 mg/dL    BILIRUBIN DIRECT 0.5 (H) <=0.3 mg/dL    PROTEIN TOTAL 4.9 (L) 6.0 - 8.3 g/dL   INFLUENZA VIRUS  TYPE A AND TYPE B, PCR   Result Value Ref Range    INFLUENZA VIRUS TYPE A Negative Negative    INFLUENZA VIRUS TYPE B Negative Negative   PTT (PARTIAL THROMBOPLASTIN TIME)   Result Value Ref Range    APTT 30.3 25.0 - 37.0 seconds   MANUAL DIFFERENTIAL   Result Value Ref Range    NEUTROPHIL % 63 50 - 70 %    LYMPHOCYTE % 11 (L) 20 - 40 %    MONOCYTE % 7 2 - 11 %    BAND % 11 (H) 0 - 6 %    METAMYELOCYTE % 1 %    MYELOCYTE % 5 %    PROMYELOCYTE % 2 %    NEUTROPHIL ABSOLUTE 4.29 x10^3/uL    LYMPHOCYTE ABSOLUTE 0.64 x10^3/uL    MONOCYTE ABSOLUTE 0.41 x10^3/uL    METAMYELOCYTE ABSOLUTE 0.06 x10^3/uL    MYELOCYTE ABSOLUTE 0.29 x10^3/uL    PROMYELOCYTE ABSOLUTE 0.12 x10^3/uL    NUCLEATED RBC MANUAL 5.0     PLATELET ESTIMATE Adequate     ANISOCYTOSIS  4+     MACROCYTOSIS 1+     POLYCHROMASIA 2+     WBC MORPHOLOGY COMMENT Normal     WBC 5.8 x10^3/uL   PTT (PARTIAL THROMBOPLASTIN TIME)   Result Value Ref Range    APTT 152.1 (H) 25.0 - 37.0 seconds   SODIUM   Result Value Ref Range    SODIUM 125 (L) 135 - 145 mmol/L   BASIC METABOLIC PANEL   Result Value Ref Range    SODIUM 126 (L) 135 - 145 mmol/L    POTASSIUM 7.1 (HH) 3.5 - 5.0 mmol/L    CHLORIDE 93 (L) 98 - 111 mmol/L    CO2 TOTAL 18 (L) 21 - 35 mmol/L    ANION GAP 15 mmol/L    CALCIUM 9.1 8.8 - 10.3 mg/dL    GLUCOSE 180 (H) 70 - 110 mg/dL    BUN 38 (H) 10 - 25 mg/dL    CREATININE 1.91 (H) <=1.30 mg/dL    BUN/CREA RATIO 20     ESTIMATED GFR 35 (L) Avg: 75 mL/min/1.41m^2   ARTERIAL BLOOD GAS   Result Value Ref Range    %FIO2 (ARTERIAL) 100 %    BICARBONATE (ARTERIAL) 11.6 (L) 22.0-28.0 mmol/L mmol/L    PCO2 (ARTERIAL) 34.0 (L) 35.0 - 48.0 mm/Hg    PH (ARTERIAL) 7.12 (LL) 7.35 - 7.45    PO2 (ARTERIAL) 263.0 mm/Hg    BASE DEFICIT 17.2 (H) 0.0 - 3.0 mmol/L    PAO2/FIO2 RATIO 263 <=200    O2 SATURATION (ARTERIAL) 99.9 %   CBC   Result Value Ref Range    WBC 5.8 3.3 - 9.3 x10^3/uL    RBC 2.36 (L) 4.40 - 5.68 x10^6/uL    HGB 7.8 (L) 13.4 - 17.3 g/dL    HCT 24.6 (L) 38.9 - 50.5 %    MCV 104.1 (H) 82.4 - 95.0 fL    MCH 32.9 27.9 - 33.1 pg    MCHC 31.6 (L) 32.8 - 36.0 g/dL    RDW 22.3 (H) 10.9 - 15.1 %    PLATELETS 114 (L) 140 - 440 x10^3/uL    MPV 7.6 6.0 - 10.2 fL   B-TYPE NATRIURETIC PEPTIDE   Result Value Ref Range    BNP 76 0 - 100 pg/mL   TROPONIN-I   Result Value Ref Range    TROPONIN I 0.08 (HH) <=0.04 ng/mL   PT/INR   Result  Value Ref Range    INR 1.76 (H) 0.80 - 3.15   BASIC METABOLIC PANEL, FASTING   Result Value Ref Range    SODIUM 124 (L) 135 - 145 mmol/L    POTASSIUM 7.2 (HH) 3.5 - 5.0 mmol/L    CHLORIDE 94 (L) 98 - 111 mmol/L    CO2 TOTAL 20 (L) 21 - 35 mmol/L    ANION GAP 10 mmol/L    CALCIUM 9.2 8.8 - 10.3 mg/dL    GLUCOSE 327 (H) 70 - 110 mg/dL    BUN 37 (H) 10 - 25 mg/dL    CREATININE 2.00 (H) <=1.30  mg/dL    BUN/CREA RATIO 19     ESTIMATED GFR 33 (L) Avg: 75 mL/min/1.19m^2   CBC/DIFF    Narrative    The following orders were created for panel order CBC/DIFF.  Procedure                               Abnormality         Status                     ---------                               -----------         ------                     CBC WITH QMGQ[[id number]]                Abnormal            Final result               MANUAL DIFFERENTIAL[[id number]]          Abnormal            Final result                 Please view results for these tests on the individual orders.   CBC WITH DIFF   Result Value Ref Range    WBC 4.0 3.3 - 9.3 x10^3/uL    RBC 2.98 (L) 4.40 - 5.68 x10^6/uL    HGB 9.8 (L) 13.4 - 17.3 g/dL    HCT 29.9 (L) 38.9 - 50.5 %    MCV 100.6 (H) 82.4 - 95.0 fL    MCH 32.8 27.9 - 33.1 pg    MCHC 32.6 (L) 32.8 - 36.0 g/dL    RDW 20.9 (H) 10.9 - 15.1 %    PLATELETS 76 (L) 140 - 440 x10^3/uL    MPV 7.5 6.0 - 10.2 fL    NEUTROPHIL % 80 %    LYMPHOCYTE % 12 %    MONOCYTE % 7 %    EOSINOPHIL % 0 %    BASOPHIL % 1 %    NEUTROPHIL # 3.20 1.60 - 5.50 x10^3/uL    LYMPHOCYTE # 0.50 (L) 0.80 - 3.20 x10^3/uL    MONOCYTE # 0.30 0.20 - 0.80 x10^3/uL    EOSINOPHIL # 0.00 0.00 - 0.50 x10^3/uL    BASOPHIL # 0.00 0.00 - 0.20 O67^1/IW   BASIC METABOLIC PANEL   Result Value Ref Range    SODIUM 127 (L) 135 - 145 mmol/L    POTASSIUM 6.8 (HH) 3.5 - 5.0 mmol/L    CHLORIDE 95 (L) 98 - 111  mmol/L    CO2 TOTAL 21 21 - 35 mmol/L    ANION GAP 11 mmol/L    CALCIUM 9.2 8.8 - 10.3 mg/dL    GLUCOSE 233 (H) 70 - 110 mg/dL    BUN 37 (H) 10 - 25 mg/dL    CREATININE 1.96 (H) <=1.30 mg/dL    BUN/CREA RATIO 19     ESTIMATED GFR 34 (L) Avg: 75 mL/min/1.84m^2   VENOUS BLOOD GAS   Result Value Ref Range    %FIO2 (VENOUS) 55.0 %    BICARBONATE (VENOUS) 13.6 (L) 23.0 - 33.0 mmol/L    PCO2 (VENOUS) 54.00 (H) 38.00 - 50.00 mm/Hg    PH (VENOUS) 7.09 (LL) 7.32 - 7.45    PO2 (VENOUS) 52.0 (H) 30.0 - 50.0 mm/Hg    BASE DEFICIT 13.6 (H) -2.0 - 3.0 mmol/L    O2  SATURATION (VENOUS) 72.4 (L) 95.0 - 98.0 %   PTT (PARTIAL THROMBOPLASTIN TIME)   Result Value Ref Range    APTT 72.9 (H) 25.0 - 37.0 seconds   PTT (PARTIAL THROMBOPLASTIN TIME)   Result Value Ref Range    APTT 41.3 (H) 25.0 - 37.0 seconds   CBC/DIFF    Narrative    The following orders were created for panel order CBC/DIFF.  Procedure                               Abnormality         Status                     ---------                               -----------         ------                     CBC WITH EHUD[[id number]]                Abnormal            Final result               MANUAL DIFFERENTIAL[[id number]]          Abnormal            Final result                 Please view results for these tests on the individual orders.   CBC WITH DIFF   Result Value Ref Range    WBC 3.2 (L) 3.3 - 9.3 x10^3/uL    RBC 3.09 (L) 4.40 - 5.68 x10^6/uL    HGB 9.9 (L) 13.4 - 17.3 g/dL    HCT 30.8 (L) 38.9 - 50.5 %    MCV 99.5 (H) 82.4 - 95.0 fL    MCH 32.2 27.9 - 33.1 pg    MCHC 32.3 (L) 32.8 - 36.0 g/dL    RDW 20.3 (H) 10.9 - 15.1 %    PLATELETS 71 (L) 140 - 440 x10^3/uL    MPV 7.1 6.0 - 10.2 fL    NEUTROPHIL % 84 %    LYMPHOCYTE % 10 %    MONOCYTE % 6 %    EOSINOPHIL % 0 %    BASOPHIL % 1 %    NEUTROPHIL # 2.70 1.60 - 5.50 x10^3/uL    LYMPHOCYTE # 0.30 (  L) 0.80 - 3.20 x10^3/uL    MONOCYTE # 0.20 0.20 - 0.80 x10^3/uL    EOSINOPHIL # 0.00 0.00 - 0.50 x10^3/uL    BASOPHIL # 0.00 0.00 - 0.20 x10^3/uL   MANUAL DIFFERENTIAL   Result Value Ref Range    NEUTROPHIL % 69 50 - 70 %    LYMPHOCYTE % 12 (L) 20 - 40 %    MONOCYTE % 5 2 - 11 %    BAND % 8 (H) 0 - 6 %    METAMYELOCYTE % 4 %    MYELOCYTE % 2 %    NEUTROPHIL ABSOLUTE 3.08 x10^3/uL    LYMPHOCYTE ABSOLUTE 0.48 x10^3/uL    MONOCYTE ABSOLUTE 0.20 x10^3/uL    METAMYELOCYTE ABSOLUTE 0.16 x10^3/uL    MYELOCYTE ABSOLUTE 0.08 x10^3/uL    NUCLEATED RBC MANUAL 12.0     PLATELET ESTIMATE Decreased     ANISOCYTOSIS 3+     MACROCYTOSIS 1+     POLYCHROMASIA 1+     TEARDROP CELLS Occasional      ECHINOCYTE (BURR CELL) Occasional     SCHISTOCYTES Rare     WBC MORPHOLOGY COMMENT Normal     WBC 4.0 x10^3/uL    POIKILOCYTOSIS 2+    LACTIC ACID   Result Value Ref Range    LACTIC ACID 7.3 (HH) <2.0 mmol/L   PROCALCITONIN, SERUM   Result Value Ref Range    PROCALCITONIN SERUM 1.40 (H) <0.50 ng/mL   MANUAL DIFFERENTIAL   Result Value Ref Range    NEUTROPHIL % 59 50 - 70 %    LYMPHOCYTE % 13 (L) 20 - 40 %    MONOCYTE % 11 2 - 11 %    BASOPHIL % 1 0 - 2 %    BAND % 10 (H) 0 - 6 %    METAMYELOCYTE % 4 %    MYELOCYTE % 2 %    NEUTROPHIL ABSOLUTE 2.21 x10^3/uL    LYMPHOCYTE ABSOLUTE 0.42 x10^3/uL    MONOCYTE ABSOLUTE 0.35 x10^3/uL    BASOPHIL ABSOLUTE 0.03 x10^3/uL    METAMYELOCYTE ABSOLUTE 0.13 x10^3/uL    MYELOCYTE ABSOLUTE 0.06 x10^3/uL    NUCLEATED RBC MANUAL 35.0     PLATELET ESTIMATE Decreased     ANISOCYTOSIS 3+     POLYCHROMASIA 1+     TEARDROP CELLS Occasional     ECHINOCYTE (BURR CELL) Rare     SCHISTOCYTES Rare     WBC MORPHOLOGY COMMENT Normal     WBC 3.2 x10^3/uL    POIKILOCYTOSIS 1+    CBC/DIFF    Narrative    The following orders were created for panel order CBC/DIFF.  Procedure                               Abnormality         Status                     ---------                               -----------         ------                     CBC WITH LNLG[[id number]]  Please view results for these tests on the individual orders.   TYPE AND SCREEN   Result Value Ref Range    UNITS ORDERED 2     ABO/RH(D) A POSITIVE     ANTIBODY SCREEN NEGATIVE     SPECIMEN EXPIRATION DATE 05/26/2017    CROSSMATCH RED CELLS - UNITS NON-IRRADIATED, 2 Units   Result Value Ref Range    Coding System [id number]     UNIT NUMBER [id number]     BLOOD COMPONENT TYPE LR RBC, Adsol3, 04730     UNIT DIVISION 00     UNIT DISPENSE STATUS ISSUED     TRANSFUSION STATUS OK TO TRANSFUSE     IS CROSSMATCH COMPATIBLE     Product Code G8185U31     Coding System [id number]     UNIT NUMBER  [id number]     BLOOD COMPONENT TYPE LR RBC, Adsol, 03820     UNIT DIVISION 00     UNIT DISPENSE STATUS ISSUED     TRANSFUSION STATUS OK TO TRANSFUSE     IS CROSSMATCH COMPATIBLE     Product Code O2774J28    POC BLOOD GLUCOSE (RESULTS)   Result Value Ref Range    GLUCOSE, POC 205 (H) 70 - 110 mg/dl   POC BLOOD GLUCOSE (RESULTS)   Result Value Ref Range    GLUCOSE, POC 208 (H) 70 - 110 mg/dl   POC BLOOD GLUCOSE (RESULTS)   Result Value Ref Range    GLUCOSE, POC 187 (H) 70 - 110 mg/dl   POC BLOOD GLUCOSE (RESULTS)   Result Value Ref Range    GLUCOSE, POC 182 (H) 70 - 110 mg/dl         I have reviewed all labs.    Micro:   Hospital Encounter on 06/05/2017 (from the past 96 hour(s))   URINE CULTURE,ROUTINE    Collection Time: 06/19/2017 12:28 PM   Culture Result Status    URINE CULTURE No Growth 18-24 hrs. Preliminary       Radiology:    Results for orders placed or performed during the hospital encounter of 06/06/2017 (from the past 24 hour(s))   XR AP MOBILE CHEST     Status: None    Narrative    Joe Carroll    PROCEDURE DESCRIPTION: XR AP MOBILE CHEST    PROCEDURE PERFORMED DATE AND TIME: 06/15/2017 11:05 AM    CLINICAL INDICATION: Cardiac issues    TECHNIQUE: 1 views / 1 images submitted.    Comparison chest x-ray May 14, 2017.    Heart size is normal and there is no infiltrate or pulmonary edema. No  pneumothorax is visualized. A right chest wall port is unchanged in  position.      Impression    No acute process.        Radiologist location ID: [id number]     CT CHEST FOR PULMONARY EMBOLUS W IV CONTRAST     Status: None    Narrative    Joe Carroll    CT CHEST FOR PULMONARY EMBOLUS W IV CONTRAST performed on 06/16/2017 12:06 PM    INDICATION: 77 years old Male  prostate CA with dyspnea, tachycardia, eval for pe    TECHNIQUE: Axial images from the thoracic inlet through the lungs obtained  after administration of IV contrast with reformatted coronal and sagittal  images, utilizing soft tissue and lung  algorithms.    CONTRAST: 60 cc of Optiray 320    RADIATION DOSE (mGy/cm): 296.70 mGy.cm  COMPARISON: No prior cross sectional studies available for comparison.    FINDINGS:  Suboptimal bolus timing limits evaluation of the distal pulmonary vessels.  Filling defects are seen in the right lower lobe subsegmental pulmonary  arteries consistent with emboli. Pulmonary trunk is normal in caliber. No  CT evidence to suggest right heart strain. Mild coronary artery  calcification is noted.    There are multiple abnormally enlarged mediastinal and bilateral hilar  lymph nodes. There are also abnormal retrocrural lymph nodes.    There are multiple subcentimeter indeterminate nodules seen in the right  upper lobe and left lingula. One such nodule on series 3 image 48 measuring  3 mm. Right upper lobe nodule measures 3 mm on series 3 image 16. There are  small bilateral pleural effusions. Mild atelectasis is seen at the left  lung base.    Innumerable hypodense masses are seen in the liver.    Innumerable sclerotic lesions are seen in the included osseous structures  consistent with metastatic disease.      Impression    1. Right lower lobe subsegmental pulmonary emboli. No CT evidence to  suggest right heart strain.  2. Innumerable masses in the liver, mediastinal adenopathy, and innumerable  sclerotic osseous lesions consistent with metastatic disease. There are  also lateral small pulmonary nodules which are indeterminate. Close  interval follow-up with chest CT is recommended in 3 months to assess  stability of these nodules.  3. Small bilateral pleural effusions.    Findings were discussed via telephone with Dr. Saunders Revel of the emergency  department at 12:27 PM on 05/23/2017.    The CT exam was performed using one or more of the following dose reduction  techniques: automated exposure control, adjustment of the mA and/or kV  according to the patient's size, or use of iterative reconstruction  technique.        Radiologist  location ID: [id number]     XR AP MOBILE CHEST     Status: None    Narrative    Joe Carroll    PROCEDURE DESCRIPTION: XR AP MOBILE CHEST    PROCEDURE PERFORMED DATE AND TIME: Jun 21, 2017 12:44 AM    CLINICAL INDICATION: hypotension, hypoxia    TECHNIQUE: 2 views / 2 images submitted.    COMPARISON: May 23, 2017    FINDINGS: Stable right Port-A-Cath. Stable small bilateral pleural  effusions and lung base atelectatic changes. No pneumothorax. No overt  edema.      Impression    Stable small bilateral pleural effusions and lung base  atelectatic changes.        Radiologist location ID: [id number]         PT/OT: No    Consults:  Nephrology    Assessment/ Plan:   Active Hospital Problems    Diagnosis   . Primary Problem: Pulmonary emboli (CMS HCC)   . Urothelial cancer (CMS HCC)   . Status post placement of ureteral stent   . Hydronephrosis of left kidney   . 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 Dizziness   . Elevated liver enzymes   . Liver masses   . Hyponatremia   . Syncope   . Paroxysmal atrial fibrillation (CMS HCC)   . Urinary retention due to benign prostatic hyperplasia   . Diabetes (CMS [place])     Plan    Severe hyperkalemia-had worsened over night despite medical treatment.  Now getting dialysis.  Will check electrolytes in 2 hr.  Severe metabolic acidosis-high anion gap- secondary to lactic acidosis-lactate 7.1, potassium 7.2.  Will treat with IV bicarbonate.     Pulmonary embolism-likely malignancy related-started IV heparin yesterday had hematuria.  Heparin was held.  Patient had hypoxia and hypotension so transferred to ICU.  Currently off heparin.  Hemodialysis started.    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  Abdominal ultrasound to eval for portal vein thrombosis today.     shock-septic versus obstructive secondary to PE.  Empiric cefepime.  Procalcitonin is  elevated at 1.4; add stress dose steroids, patient on 10 mg of prednisone at home daily.     Dizziness-concerning for brain metastases with the presence of non fatigable nystagmus.  Cannot obtain contrast study, patient claustrophobic.  Will continue to monitor.     Diabetes mellitus type 2-fingersticks a.c. HS.  Clear liquid diet added.  Fingerstick sliding scale.     Hyponatremia-likely due to hypervolemia-check urine studies.  On bicarbonate infusion will follow.    Acute kidney injury-possible obstructive uropathy versus secondary to sepsis and shock.  Rule out ischemic ATN.  Abdominal ultrasound to eval for hydronephrosis.     Paroxysmal AFib-now in sinus tachy 120s-continue amiodarone continue low-dose metoprolol.      DVT/PE Prophylaxis: Heparin  Disposition Planning: Home discharge     Patient DNR        Dorathy Kinsman, MD    Critical care time 60 min

## 2017-06-22 NOTE — Nurses Notes (Signed)
Patient and family agreeable to CRRT.  R femoral trialysis placed.  Will start CRRT.   [place] Gemayel Mascio, RN

## 2017-06-22 NOTE — Consults (Signed)
Pt seen note dictated, tolerating cvvhd, new labs to be done soon, thanks.

## 2017-06-22 NOTE — Procedures (Signed)
2017/06/13  0335    Indication:  Hyperkalemia  Procedure:  Insertion of Trialysis catheter    Procedure and risks were discussed with wife, daughter and patient who gave verbal consent to continue.  Patient was laid in a slight Trendelenburg position and chlorhexidine applied to bilateral groins.  Using ultrasound vasculature was assessed of bilateral femoral veins.  Right femoral vein appeared adequate for procedure.  Proper hand hygiene was performed and sterile gloves, gown and mask were donned.  Patient was draped in a sterile fashion and area again cleaned with chlorhexidine.  Sterile probe cover was placed on ultrasound probe.  Right groin anesthetized with 2% lidocaine.  Needle inserted into the right femoral vein under direct visualization.  Wire was passed through the needle without difficulty.  Small nick was made over needle and site was dilated with a 14 [place] dilator.  Drowsiness catheter was inserted using Seldinger technique without difficulty.  Positive blood return in all 3 ports.  Catheter was sutured to skin and sterile dressing applied.  No complications.  Patient tolerated procedure well.  Catheter is okay for use at this time.    Memory Argue, DO  [place], DO  June 13, 2017, 06:04

## 2017-06-22 NOTE — Consults (Signed)
PATIENT NAME: Joe Carroll NUMBER:  [id number]  DATE OF SERVICE: 07-Jun-2017  DATE OF BIRTH:  09-02-1940    CONSULTATION    REASON FOR CONSULT:  Atrial fibrillation, flutter, hypertension.    HISTORY AND PHYSICAL:  A 77 year old white male known to me.  I saw this patient during last admission.  The patient does have multiple end-stage problems.  He is also DNR/DNI.  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  He has a Foley catheter.  The patient went home and back mainly for unresponsiveness.  He was transferred because of hypotension, unresponsiveness.  The patient does have hematuria.  The patient went to [facility] and had extensive workup.  He has a history of atrial fibrillation.  He refused anticoagulation.  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  The patient has extremely poor functional capacity, pretty much is in bed.  He was sent home on amiodarone.    PAST MEDICAL HISTORY:  1. History of prostate metastatic cancer.  2. History of syncope.  3. History of diabetes.  4. Urosepsis.  5. Altered mental status.    REVIEW OF SYSTEMS:  HEENT showed he has some hearing problem.  Lungs:  History of COPD.  Cardiovascular: History of atrial fibrillation, heart murmur.  GI:  History of acid reflux disease.  Urology history of prostate cancer, hematuria. Foley catheter.    SOCIAL HISTORY:  He denies smoking.  He wishes to be DNR.    PHYSICAL EXAMINATION:  Elderly white male, alert, awake, and oriented x3.  Vitals:  Heart rate irregular at 90/200, blood pressure 100/70, respirations 20.  HEENT showed no JVD.  Lungs:  Good air entry, no crackles.  Cardiovascular:  Heart sounds are irregular, systolic murmur at the apex.  Extremities:  No edema.    LABORATORY AND X-RAY DATA:  White count 4.0, hemoglobin 9.8, platelet count 76,000.  Potassium 4.2, creatinine 2.0.    IMPRESSION:  1. The patient is here mainly for urosepsis.  He has got a  Foley catheter.  He has got hematuria.  He is hypotensive on pressors.  2. History of atrial fibrillation.  The patient appears to be in atrial rhythm, rate well controlled with amiodarone.  He refused anticoagulation.    3. History of diabetes.  4. History of metastatic prostate cancer.   5. The patient is a DNI/DNR.    PLAN:  The patient is not a candidate for invasive workup.  He is aware his prognosis is poor.  Consider urology consult.        Con Memos, MD                DD:  06-07-2017 07:14:25  DT:  06-07-17 09:09:12 JR  D#:  [id number]

## 2017-06-22 NOTE — Progress Notes (Signed)
Death pronouncement note    Paged to see critically ill patient early and shift.  Family did not want any further pressors used.  The patient was DNR/DNI.  Patient expired at 8:07 p.m. On Jun 21, 2017. No spontaneous movements or respirations.  No heart or breath sounds.  Absent corneal reflex.  No response to verbal or painful stimuli.  Death certificate has been completed.    Vedia Pereyra, MD  2017-06-21, 20:38

## 2017-06-22 NOTE — Consults (Signed)
Dr. Benita Gutter     DEPARTMENT OF HEMATOLOGY/ONCOLOGY  DOS: 06/14/17    CHIEF COMPLAINT:  Metastatic prostate cancer    HISTORY OF PRESENTING ILLNESS THIS ADMISSION:  Joe Carroll is a pleasant 77 year old male with a past medical history significant for having a metastatic, castration resistant prostate cancer and is under the care of Dr. Broadus John at [facility] in [place].  The patient has had an extensive amount of therapy as detailed by Dr. Delene Loll last note as below.  He was last seen by Dr. Broadus John on April 27, 2017 at which time the plan was to continue carboplatin/cabazitaxel.  Unfortunately presented to the emergency department during this admission with a history of lower extremity edema.  He was in fact found to have acute renal failure with hyperkalemia with a serum potassium of 7.2.  He had to be started on urgent dialysis.  Throughout his hospital course, he had significant hypotension with resultant shock liver and was brought down to the ICU whereby he was initiated on pressors.  Moreover, he was also found to have hypoxia during his transferred to the ICU and was found to have right lower lobe segmental pulmonary embolism.  CT scan had also revealed an overwhelming liver metastases.  His liver enzymes, given shock liver were extremely elevated.  Given all this, the patient was kept in the ICU on pressors and dialysis.  Moreover he was also initiated on heparin however was beginning to have hemoptysis and therefore the heparin was given cautiously without a bolus.  An oncology consultation was placed given his overall history of prostate cancer in an attempt to aid in assistance in decision making.    HEMATOLOGY/ONCOLOGY HISTORY:  Diagnosis and Treatment of Primary with EBRT  04/2013: Diagnosed with prostate cancer with Gleason 9, and PSA of 32. Underwent TURP followed by EBRT by Dr. Dorina Hoyer. He started ADT at that time and remained on it.     Metastatic hormone resistant  disease-First Line-Xtandi  07/2015-08/2016: Reimaging with bone scan with new uptake in the bone, CT negative. Began ADT and Xtandi. PSA was roughly 2.0 when he started. On Xtandi with initial response    Second-line therapy-Zytiga  08/23/16-10/2016: PSA 10-31, while on Zytiga    Third line therapy-Taxotere  November 18, 2016: PSA 35.9, we will initiate chemotherapy with Taxotere    December 15, 2016-01/04/17: Cycle 2-3 Taxotere complicated by infusion reactions    Fourth Line-Jevtana  01/24/17: Switch to Jevtana made for infusion reaction related to taxotere.    Fifth Line-Jevtana/Carboplatin  03/28/17: Progression of disease, decided to switch to Jevtana/Carboplatin       PAST MEDICAL HISTORY:   Metastatic castration resistant prostate cancer   Bone metastases   Overwhelming liver metastases   Hyperkalemia   Acute renal failure   Type 2 diabetes mellitus   Essential hypertension   Atrial fibrillation   Right lower lobe pulmonary embolism   Muscle invasive urothelial carcinoma    PAST SURGICAL HISTORY:  TURP    FAMILY HISTORY:  Mother had diabetes mellitus and coronary artery disease    SOCIAL HISTORY:  Patient with his family does not smoke or consume alcohol in excess    ALLERGIES:  Ciprofloxacin    REVIEW OF SYSTEMS:  GENERAL:  no fevers, chills  HEENT: no sore throat, congestion, blurry vision  Lungs: no shortness of breath, cough, wheezes at this time  GI: no nausea, vomiting, diarrhea, constipation  GU: No dysuria, urgency, increased frequency   Cardiac:  no chest pains, palpitations, syncope,   Neuro: no weakness, loss of function, numbness, tingling  Skin: no rash  Musculoskeletal: No myalgias  Psychosocial: No depression, anxiety  Hematologic: No easy bruising, abnormal bleeding  All other review ROS negative except those mentioned above.     PATIENT HISTORY:  Past Medical History:   Diagnosis Date   . Acute renal failure (CMS HCC)    . Atrial fibrillation (CMS HCC)     paroxismal   . BPH (benign  prostatic hyperplasia)    . Cancer (CMS [place])    . Diabetes mellitus, type 2 (CMS HCC)    . Essential hypertension    . Prostate CA (CMS [place])    . Prostatitis    . Wears dentures    . Wears glasses          Past Surgical History:   Procedure Laterality Date   . HX TURP     . KNEE MASS EXCISION           Family Medical History:     Problem Relation (Age of Onset)    Coronary Artery Disease Mother    Diabetes Mother              Current Facility-Administered Medications   Medication Dose Route Frequency   . acetaminophen (TYLENOL) tablet  325 mg Oral Q6H PRN   . aluminum-magnesium hydroxide-simethicone (MAG-AL PLUS) 200-200-20 mg per 5 mL oral liquid  20 mL Oral 4x/day PRN   . amiodarone (CORDARONE) tablet  200 mg Oral 2x/day   . aspirin chewable tablet 81 mg  81 mg Oral NIGHTLY   . calcium carbonate-vitamin D3 1250mg  (500mg )-200 units per tablet  2 Tab Oral 2x/day   . ceFEPime (MAXIPIME) 1g in NS 59mL IVPB minibag  1 g Intravenous Q12H   . D5W 1/2 NS 1,000 mL with sodium bicarbonate 50 mEq infusion   Intravenous Continuous   . dextrose 50% (0.5 g/mL) injection - syringe  25 g Intravenous Once   . dextrose 50% (0.5 g/mL) injection - syringe  25 g Intravenous Q15 Min PRN   . heparin 1,000 units/mL injection IV bolus  100 Units Intravenous Once   . heparin 25,000 units in D5W 250 mL infusion  10 Units/kg/hr (Adjusted) Intravenous Continuous   . insulin lispro (HUMALOG) 100 units/mL injection  14 Units Subcutaneous Now   . meclizine (ANTIVERT) tablet  12.5 mg Oral 3x/day PRN   . methylPREDNISolone sod succ (SOLU-MEDROL) 40 mg/mL injection  40 mg Intravenous Daily   . metoclopramide (REGLAN) 5 mg/mL injection  10 mg Intravenous Q6H PRN   . metoprolol tartrate (LOPRESSOR) tablet  12.5 mg Oral 2x/day   . multivitamin with iron and minerals tablet  1 Tab Oral Daily   . NS 10 mL injection  10 mL Intravenous Give in Radiology   . NS flush syringe  3 mL Intracatheter Q8HRS   . NS flush syringe  3 mL Intracatheter Q1H PRN   . NS  flush syringe  2-6 mL Intracatheter Q1H PRN   . pantoprazole (PROTONIX) injection  40 mg Intravenous Daily   . phenylephrine (NEO-SYNEPHRINE) 10 mg in NS 250 mL infusion  50 mcg/min Intravenous Continuous   . sodium polystyrene sulfonate (KAYEXALATE) 15 g per 60 mL oral liquid - with sorbitol  45 g Oral Now   . SSIP insulin lispro (HUMALOG) 100 units/mL injection  1-12 Units Subcutaneous 4x/day AC         PHYSICAL EXAMINATION:  VITALS:  Filed Vitals:    06/12/2017 1645 2017/06/12 1700 12-Jun-2017 1720 Jun 12, 2017 1725   BP: (!) 108/92 119/82 (!) 77/55 (!) 80/58   Pulse: 75 74 89 98   Resp: (!) 28 (!) 27 (!) 28 (!) 28   Temp:       SpO2: 100% 100% 100% 100%       EXAM:   Consitutional: No acute distress.    Eyes: EOMI. No discharge. No Jaundice.   ENT: mucous membranes dry. No posterior pharynx lesions.. Neck supple, No palpable masses   Heme/ Lymph: No Cervical, Inguinal, axillary lymh nodes. No Bruising.   Cardiovascular: S1, S2, No murmurs, rubs, or gallops.   Respiratory: Clear to auscultate and percuss B/L, No rhonchi, crackles, or wheezing.   Abdomen: Normal Bowel Sounds, nontender nondistended. No hepatosplenomegaly.   Musculoskeletal: No Edema to the extremities.   Skin: Normal turgor. No Rashes,skin lesions   Psychiatry: Normal Affect   Neuro: No focal deficits. Alert and Oriented x 3    LABORATORY/RADIOLOGICAL DATA: All pertinent labs were reviewed.     ASSESSMENT AND PLAN:  77 year old male with history of castration resistant prostate cancer currently admitted to the inpatient service with overwhelming liver metastases, pulmonary embolism, hyperkalemia due to acute renal failure:    Castration resistant prostate cancer:   All images were reviewed.   The patient's prognosis is extremely poor especially since he has seen almost all standard chemotherapies which have included enzalutamide, abiraterone, docetaxel, cabazitaxel and then finally carboplatin/cabazitaxel.   Review of his images reveals an overwhelming  liver metastases.  His current liver enzymes are elevated most likely due to shock liver as well as metastatic disease.   I had had an extensive discussion with the patient.  Informed him of his poor prognosis.  In fact the patient himself is aware of the poor prognosis however still would like to proceed aggressively with dialysis.  He understands that his condition is incurable.  If in the ensuing few days, his condition does not improve then I would highly recommend that he be placed home with hospice.   I was wanting to discuss this with his family as well however they had already gone home by the time I had gotten to see the patient.   Off clinical trials, his treatment options are extremely limited however I doubt that with his current condition he will be able to make it to be healthy enough to be initiated on clinical trial.  At the current moment he would not qualify for any inclusion criteria for any clinical trial.        Benita Gutter, MD

## 2017-06-22 NOTE — Nurses Notes (Signed)
Dr.Peters at bedside with family. Wife/Surrogate wishes to withdrawal care at this time. Orders and care to follow.  Leatrice Jewels, RN  Jun 19, 2017, 20:13

## 2017-06-22 NOTE — Consults (Signed)
PATIENT NAME: Joe Carroll NUMBER:  [id number]  DATE OF SERVICE: 06-01-17  DATE OF BIRTH:  12-14-1940    CONSULTATION    REASON FOR CONSULT:  Elevated potassium, BUN and creatinine.    HISTORY:  This is a 77 year old gentleman who came to the hospital with diagnosis of pulmonary emboli.  He had a CT scan with IV contrast which showed multiple pulmonary emboli.    PAST MEDICAL HISTORY:  Prostate cancer with metastasis, hydronephrosis, left kidney, ureteral stent, urothelial cancer, diabetes mellitus, BPH, atrial fibrillations, syncope, hyponatremia, liver masses, liver METS.    MEDICATIONS:  1. IV bicarbonate.  2. Neo-Synephrine.  3. Cordarone.  4. Aspirin.  5. Calcium.  6. Maxipime.  7. Insulin.  8. Solu-Medrol.  9. Lopressor.  10. Vitamins.  11. Kayexalate.  12. Tylenol.  [place].  14. Antivert.    The patient is in the ICU.  He had a low urine output overnight.  Dr. Ferdinand Lango called me about 3 o'clock in the morning to go over his case in detail with the description of his deteriorating kidney function and rising potassium despite medical treatment requesting urgent dialysis sessions to be started in the middle night to at least start getting his potassium headed in the right direction.  Arrangements were made for in-house physician to insert a Trialysis catheter to get continuous dialysis treatment started at bed bedside in the ICU.  This was accomplished in the middle of the night.  The patient was on IV pressors at this time with very low blood pressures.    PHYSICAL EXAMINATION:  Currently on examination the patient is awake in the ICU.  CRRT is running at bedside.  He is on pressors and IV bicarb drip.  The patient is not short of breath.  He is awake but does not appear to be in any distress.  Lying flat.  Blood pressure is still running low.  Heart rate about 90.  Skin is pale.  There is no JVD.  His heart was without any rub.  Lungs no wheezes.  Abdomen nondistended.  Extremities mild edema.   His toenails are long and unkempt.    LABORATORY AND X-RAY DATA:  White blood count 3200, hemoglobin 9.9, platelets 71,000, sodium 124, potassium 7.2, CO2 20, BUN 37, creatinine 2.0.  Troponin 0.08.  CT scan was done and showed the metastasis and pulmonary emboli.    IMPRESSION:  1. Acute renal failure.  2. Acidosis.  3. Hyponatremia.  Could be all due to contrast or nephropathy causing acute tubular necrosis.    4. Thrombocytopenia.  5. Anemia of metastatic cancer.    SUGGESTIONS:  Continue with dialysis for now, it seems to be working okay.  He is not making much urine at all.  Ultrasound has been ordered to check for hydronephrosis.  Continued to check labs.  Adjust medicines for low GFR.  If he needs urological intervention his urologists are from [facility].        Mickie Kay, MD                DD:  06/01/2017 10:57:30  DT:  06/01/2017 11:41:03 JR  D#:  [id number]

## 2017-06-22 NOTE — Nurses Notes (Signed)
Patient becoming bradycardic 58-60, lethargic, BP decreasing. CRRT rinseback administered and disconeccted. Wife and Family notified of change in patient, on their way in. Dr.Peters notified as well.   Leatrice Jewels, RN  Jun 04, 2017, 08:02

## 2017-06-22 NOTE — Nurses Notes (Signed)
Post Mortem Note    Respirations ceased at: 2007  Heart stopped at: 2007  MD Notified: Dr. Ferdinand Lango  Clinical Coordinator Notified: yes  Pronounced by: Dr. Ferdinand Lango  Time: 2045  Family Notified: at bedside  Funeral Home: [employer]  Belongings: [place] home with wife.  Dentures not with patient at TOD.  Time Body to Morgue: 2120  To Morgue by: Derinda Sis, RN       Derinda Sis, RN

## 2017-06-22 NOTE — Nurses Notes (Addendum)
Received patient from [place].  Connected to CCU monitors.  Dr Ferdinand Lango at bedside putting orders in computer.  Patient's wife is designated surrogate.  Patient DNR/DNI at this time. Will continue to monitor. Family in waiting room and aware of situation.  [place] Tu Shimmel, RN

## 2017-06-22 NOTE — Care Plan (Signed)
EOSN:    Patient more alert this morning.  Triple lumen trialysis in right groin, ready to begin CRRT for day shift.  2u PRBC infused overnight, tolerated well.  Neo infusing @35mcs , IVF infusing @100mL .  Treated for elevated K+.  Bloody output through foley catheter, heparin on hold per MD order.  Family and patient in agreement with treatment plan.  Patient remains DNR/DNI. BP (!) 103/35   Pulse 97   Temp 36.3 C (97.3 F)   Resp (!) 28   Ht 1.727 m (5\' 8" )   Wt 86 kg (189 lb 9.5 oz)   SpO2 100%   BMI 28.83 kg/m     [place] Shalon Councilman, RN

## 2017-06-22 NOTE — Care Plan (Signed)
Patient lives home with spouse at this time, will continue to monitor for needs.

## 2017-06-22 NOTE — Nurses Notes (Signed)
Patient alert, but disoriented this AM.  CRRT initiated @ 0919 through trialysis cath per MD order. Orientation/confusion improved after CRRT initiated.  CRRT tolerated well at this time.  Family at bedside when Dr Kathaleen Bury rounding, spoke with MD. New orders received.  Heparin infusion initiated per MD order. Neosynephrine infusion and HCO3 infusion continues.  Dr. Parke Simmers in to see patient.Esaw Grandchild, RN  06/12/17, 12:56

## 2017-06-22 NOTE — Nurses Notes (Signed)
Dr.Ramanathan and Dr.Demarco notified for ascultation of lungs now coarse, frequent coughing of brown thin sputum, and restless. O2 Sat is 97-100% on 3liters NC. Orders received to titrate IVF rate and UF rate on CRRT.  Leatrice Jewels, RN  06/05/2017, 14:55

## 2017-06-22 NOTE — Ancillary Notes (Signed)
Support provided by on call Chaplain, Loraine Maple at death of patient.

## 2017-06-22 NOTE — Consults (Signed)
Mainly admitted for urosepsis, hypertension    Atrial fibrillation, paroxysmal patient on amiodarone he refused anticoagulation he got frank hematuria    Metastatic prostate cancer    Diabetic autonomic dysfunction with syncope, very poor functional capacity    Hypotension from urosepsis    Plan no further cardiac workup patient is aware his prognosis poor is DNR

## 2017-06-22 DEATH — deceased

## 2017-12-27 ENCOUNTER — Telehealth (INDEPENDENT_AMBULATORY_CARE_PROVIDER_SITE_OTHER): Payer: Self-pay | Admitting: Family Medicine

## 2017-12-27 NOTE — Telephone Encounter (Signed)
Jola Babinski called - wondered if you had received results from Radiology from last Wednesday

## 2017-12-27 NOTE — Telephone Encounter (Signed)
On Axyl or her

## 2017-12-28 NOTE — Telephone Encounter (Signed)
On Troy Velasquez

## 2018-02-02 ENCOUNTER — Other Ambulatory Visit
Admission: RE | Admit: 2018-02-02 | Discharge: 2018-02-02 | Disposition: A | Discharge status: Home / Self Care | Payer: Medicare PPO | Source: Ambulatory Visit | Attending: Family Medicine | Admitting: Family Medicine

## 2018-02-02 LAB — COMPREHENSIVE METABOLIC PANEL
ALT: 18 U/L (ref 0–55)
AST (SGOT): 19 U/L (ref 10–42)
Albumin/Globulin Ratio: 1.26 Ratio (ref 0.80–2.00)
Albumin: 3.9 gm/dL (ref 3.5–5.0)
Alkaline Phosphatase: 78 U/L (ref 40–145)
Anion Gap: 11.8 mMol/L (ref 7.0–18.0)
BUN / Creatinine Ratio: 18.5 Ratio (ref 10.0–30.0)
BUN: 23 mg/dL — ABNORMAL HIGH (ref 7–22)
Bilirubin, Total: 0.6 mg/dL (ref 0.1–1.2)
CO2: 28 mMol/L (ref 20–30)
Calcium: 9.5 mg/dL (ref 8.5–10.5)
Chloride: 106 mMol/L (ref 98–110)
Creatinine: 1.24 mg/dL (ref 0.80–1.30)
EGFR: 57 mL/min/{1.73_m2} — ABNORMAL LOW (ref 60–150)
Globulin: 3.1 gm/dL (ref 2.0–4.0)
Glucose: 93 mg/dL (ref 71–99)
Osmolality Calculated: 285 mOsm/kg (ref 275–300)
Potassium: 4.8 mMol/L (ref 3.5–5.3)
Protein, Total: 7 gm/dL (ref 6.0–8.3)
Sodium: 141 mMol/L (ref 136–147)

## 2018-02-02 LAB — LIPID PANEL
Cholesterol: 138 mg/dL (ref 75–199)
Coronary Heart Disease Risk: 3.14
HDL: 44 mg/dL (ref 40–55)
LDL Calculated: 76 mg/dL
Triglycerides: 88 mg/dL (ref 10–150)
VLDL: 18 (ref 0–40)

## 2018-02-02 LAB — PSA: PSA: 0.504 ng/mL (ref 0.000–4.000)

## 2018-02-06 ENCOUNTER — Encounter (INDEPENDENT_AMBULATORY_CARE_PROVIDER_SITE_OTHER): Payer: Self-pay

## 2018-02-06 ENCOUNTER — Encounter (INDEPENDENT_AMBULATORY_CARE_PROVIDER_SITE_OTHER): Payer: Self-pay | Admitting: Family Medicine

## 2018-02-06 ENCOUNTER — Ambulatory Visit (INDEPENDENT_AMBULATORY_CARE_PROVIDER_SITE_OTHER): Payer: Medicare PPO | Admitting: Family Medicine

## 2018-02-06 VITALS — BP 128/80 | HR 62 | Temp 98.0°F | Resp 16 | Ht 67.0 in | Wt 166.0 lb

## 2018-02-06 DIAGNOSIS — I2510 Atherosclerotic heart disease of native coronary artery without angina pectoris: Secondary | ICD-10-CM

## 2018-02-06 DIAGNOSIS — E785 Hyperlipidemia, unspecified: Secondary | ICD-10-CM

## 2018-02-06 DIAGNOSIS — J449 Chronic obstructive pulmonary disease, unspecified: Secondary | ICD-10-CM | POA: Insufficient documentation

## 2018-02-06 DIAGNOSIS — J439 Emphysema, unspecified: Secondary | ICD-10-CM

## 2018-02-06 DIAGNOSIS — R7301 Impaired fasting glucose: Secondary | ICD-10-CM | POA: Insufficient documentation

## 2018-02-06 DIAGNOSIS — I10 Essential (primary) hypertension: Secondary | ICD-10-CM

## 2018-02-06 NOTE — Progress Notes (Signed)
Subjective:    Patient ID: Troy Velasquez is a 77 y.o. male.    Patient is here today for a follow-up visit on his blood pressure, cholesterol, CAD, and COPD.  Patient quit smoking about 9 or 10 months ago.  Overall his breathing is doing much better.  Denies any cough, wheezing, or shortness of breath.  No recent exacerbations of COPD.  After he quit smoking, he did gain about 20 pounds.    We reviewed his labs.  Total cholesterol and LDL are doing well.  He continues with Zocor on a daily basis.  Denies any chest pain or shortness of breath.    Blood pressure is been controlled at home.  About 6 months ago, blood pressure was elevated, but over the last few months, blood pressure has been staying around 120/80.  Denies any swelling in the extremities.    Since last visit, patient had a partial circumcision.  Denies any urinary restriction.  No discharge or drainage from the area.    He has CAD.  He continues with Plavix therapy.  Denies any signs of bleeding such as melena hematochezia      The following portions of the patient's history were reviewed and updated as appropriate: allergies, current medications, past family history, past medical history, past social history, past surgical history and problem list.    Review of Systems   Constitutional: Negative for chills and fever.   Eyes: Negative for visual disturbance.   Respiratory: Negative for shortness of breath and wheezing.    Cardiovascular: Negative for chest pain, palpitations and leg swelling.   Gastrointestinal: Negative for abdominal pain.   Genitourinary: Negative for difficulty urinating and hematuria.   Musculoskeletal: Positive for arthralgias.   Skin: Negative for rash.   Neurological: Negative for dizziness.   Psychiatric/Behavioral: The patient is not nervous/anxious.            Objective:    Physical Exam  Constitutional:       Appearance: Normal appearance.   HENT:      Head: Normocephalic.      Mouth/Throat:      Mouth: Mucous membranes are  moist.   Eyes:      Pupils: Pupils are equal, round, and reactive to light.   Neck:      Musculoskeletal: Normal range of motion.   Cardiovascular:      Rate and Rhythm: Normal rate and regular rhythm.      Heart sounds: Normal heart sounds. No murmur.   Pulmonary:      Effort: Pulmonary effort is normal.      Breath sounds: Normal breath sounds. No wheezing.   Abdominal:      General: Bowel sounds are normal.      Palpations: Abdomen is soft.   Musculoskeletal: Normal range of motion.      Right lower leg: No edema.      Left lower leg: No edema.   Skin:     General: Skin is warm and dry.   Neurological:      General: No focal deficit present.      Mental Status: He is alert and oriented to person, place, and time.   Psychiatric:         Mood and Affect: Mood normal.         Behavior: Behavior normal.             Assessment:       1. Dyslipidemia    2. Essential hypertension  3. Pulmonary emphysema, unspecified emphysema type    4. Coronary artery disease involving native coronary artery of native heart without angina pectoris          Plan:       1) cholesterol is controlled.  Continue Zocor  2) blood pressure stable.  Continue lisinopril and carvedilol  3) COPD is stable.  Continue to monitor.  Continue with smoking cessation  4) CAD is stable.  Continue with Plavix, ACE inhibitor, statin therapy, and beta-blocker.

## 2018-03-27 ENCOUNTER — Ambulatory Visit (INDEPENDENT_AMBULATORY_CARE_PROVIDER_SITE_OTHER): Payer: Medicare PPO | Admitting: Family Medicine

## 2018-03-27 ENCOUNTER — Encounter (INDEPENDENT_AMBULATORY_CARE_PROVIDER_SITE_OTHER): Payer: Self-pay | Admitting: Family Medicine

## 2018-03-27 ENCOUNTER — Ambulatory Visit (HOSPITAL_BASED_OUTPATIENT_CLINIC_OR_DEPARTMENT_OTHER)
Admission: RE | Admit: 2018-03-27 | Discharge: 2018-03-27 | Disposition: A | Discharge status: Home / Self Care | Payer: Commercial Managed Care - PPO | Source: Ambulatory Visit | Attending: EXTERNAL | Admitting: EXTERNAL

## 2018-03-27 ENCOUNTER — Other Ambulatory Visit: Payer: Self-pay

## 2018-03-27 ENCOUNTER — Other Ambulatory Visit (HOSPITAL_BASED_OUTPATIENT_CLINIC_OR_DEPARTMENT_OTHER): Payer: Self-pay | Admitting: EXTERNAL

## 2018-03-27 VITALS — BP 148/64 | HR 64 | Temp 97.7°F | Resp 16 | Ht 67.0 in | Wt 167.0 lb

## 2018-03-27 DIAGNOSIS — G8929 Other chronic pain: Secondary | ICD-10-CM | POA: Insufficient documentation

## 2018-03-27 DIAGNOSIS — M25861 Other specified joint disorders, right knee: Secondary | ICD-10-CM | POA: Insufficient documentation

## 2018-03-27 DIAGNOSIS — M25561 Pain in right knee: Secondary | ICD-10-CM | POA: Insufficient documentation

## 2018-03-27 NOTE — Progress Notes (Signed)
Subjective:    Patient ID: Troy Velasquez is a 78 y.o. male.    Patient is here today for an acute visit.  Over the last 3 to 4 weeks, ongoing right-sided knee pain.  Pain located over the medial aspect of the right knee.  Pain is fine when Troy Velasquez is sitting.  When Troy Velasquez stands up Troy Velasquez feels a sharp, grinding sensation on the medial aspect of the right knee.  Troy Velasquez has had some mild swelling in a palpable Baker's cyst in the posterior aspect of the right knee.  No injury or trauma.  Troy Velasquez has been taking some occasional Tylenol with minimal success.  After walking for about 5 or 10 minutes, pain seems to somewhat dissipate      The following portions of the patient's history were reviewed and updated as appropriate: allergies, current medications, past family history, past medical history, past social history, past surgical history and problem list.    Review of Systems   Constitutional: Negative for chills and fever.   Respiratory: Negative for shortness of breath.    Cardiovascular: Negative for chest pain.   Musculoskeletal: Positive for arthralgias and joint swelling.   Skin: Negative for rash.   Neurological: Negative for weakness.           Objective:    Physical Exam  Constitutional:       Appearance: Normal appearance.   HENT:      Head: Normocephalic.   Cardiovascular:      Rate and Rhythm: Normal rate and regular rhythm.   Pulmonary:      Effort: Pulmonary effort is normal.      Breath sounds: Normal breath sounds.   Musculoskeletal: Normal range of motion.         General: Tenderness present.      Right lower leg: No edema.      Left lower leg: No edema.      Comments: Tenderness over the medial aspect of the right knee.  Palpable Baker's cyst.  Mild crepitus with range of motion   Skin:     General: Skin is warm and dry.   Neurological:      General: No focal deficit present.      Mental Status: Troy Velasquez is alert and oriented to person, place, and time.             Assessment:       1. Chronic pain of right knee           Plan:       1) discussed a 10 to 14-day course of Aleve twice a day.  Orders given for right knee x-ray.  If not improving, we discussed possible cortisone injection

## 2018-03-29 ENCOUNTER — Telehealth (INDEPENDENT_AMBULATORY_CARE_PROVIDER_SITE_OTHER): Payer: Self-pay

## 2018-03-29 NOTE — Telephone Encounter (Signed)
Called patient about his xray results.  Let him know that he has arthritis and if the pain gets worse that he can schedule an appt for a knee injection. Claybon Jabs, RN

## 2018-04-10 ENCOUNTER — Other Ambulatory Visit (INDEPENDENT_AMBULATORY_CARE_PROVIDER_SITE_OTHER): Payer: Self-pay | Admitting: Family Medicine

## 2018-07-27 ENCOUNTER — Other Ambulatory Visit (INDEPENDENT_AMBULATORY_CARE_PROVIDER_SITE_OTHER): Payer: Self-pay | Admitting: Family Medicine

## 2018-08-03 ENCOUNTER — Other Ambulatory Visit
Admission: RE | Admit: 2018-08-03 | Discharge: 2018-08-03 | Disposition: A | Discharge status: Home / Self Care | Payer: Medicare PPO | Source: Ambulatory Visit | Attending: Family Medicine | Admitting: Family Medicine

## 2018-08-03 DIAGNOSIS — E785 Hyperlipidemia, unspecified: Secondary | ICD-10-CM

## 2018-08-03 DIAGNOSIS — I10 Essential (primary) hypertension: Secondary | ICD-10-CM

## 2018-08-03 LAB — COMPREHENSIVE METABOLIC PANEL
ALT: 19 U/L (ref 0–55)
AST (SGOT): 23 U/L (ref 10–42)
Albumin/Globulin Ratio: 1.31 Ratio (ref 0.80–2.00)
Albumin: 3.8 gm/dL (ref 3.5–5.0)
Alkaline Phosphatase: 68 U/L (ref 40–145)
Anion Gap: 15.9 mMol/L (ref 7.0–18.0)
BUN / Creatinine Ratio: 16.1 Ratio (ref 10.0–30.0)
BUN: 22 mg/dL (ref 7–22)
Bilirubin, Total: 0.7 mg/dL (ref 0.1–1.2)
CO2: 25 mMol/L (ref 20–30)
Calcium: 9 mg/dL (ref 8.5–10.5)
Chloride: 107 mMol/L (ref 98–110)
Creatinine: 1.37 mg/dL — ABNORMAL HIGH (ref 0.80–1.30)
EGFR: 50 mL/min/{1.73_m2} — ABNORMAL LOW (ref 60–150)
Globulin: 2.9 gm/dL (ref 2.0–4.0)
Glucose: 92 mg/dL (ref 71–99)
Osmolality Calculated: 288 mOsm/kg (ref 275–300)
Potassium: 4.9 mMol/L (ref 3.5–5.3)
Protein, Total: 6.7 gm/dL (ref 6.0–8.3)
Sodium: 143 mMol/L (ref 136–147)

## 2018-08-03 LAB — LIPID PANEL
Cholesterol: 128 mg/dL (ref 75–199)
Coronary Heart Disease Risk: 3.56
HDL: 36 mg/dL — ABNORMAL LOW (ref 40–55)
LDL Calculated: 73 mg/dL
Triglycerides: 97 mg/dL (ref 10–150)
VLDL: 19 (ref 0–40)

## 2018-08-08 ENCOUNTER — Encounter (INDEPENDENT_AMBULATORY_CARE_PROVIDER_SITE_OTHER): Payer: Self-pay | Admitting: Family Medicine

## 2018-08-08 ENCOUNTER — Ambulatory Visit (INDEPENDENT_AMBULATORY_CARE_PROVIDER_SITE_OTHER): Payer: Medicare PPO | Admitting: Family Medicine

## 2018-08-08 VITALS — BP 132/70 | HR 69 | Temp 97.9°F | Resp 18 | Ht 67.0 in | Wt 169.0 lb

## 2018-08-08 DIAGNOSIS — E785 Hyperlipidemia, unspecified: Secondary | ICD-10-CM

## 2018-08-08 DIAGNOSIS — L209 Atopic dermatitis, unspecified: Secondary | ICD-10-CM

## 2018-08-08 DIAGNOSIS — I2510 Atherosclerotic heart disease of native coronary artery without angina pectoris: Secondary | ICD-10-CM

## 2018-08-08 DIAGNOSIS — I10 Essential (primary) hypertension: Secondary | ICD-10-CM

## 2018-08-08 DIAGNOSIS — J439 Emphysema, unspecified: Secondary | ICD-10-CM

## 2018-08-08 DIAGNOSIS — Z125 Encounter for screening for malignant neoplasm of prostate: Secondary | ICD-10-CM

## 2018-08-08 MED ORDER — TRIAMCINOLONE ACETONIDE 0.1 % EX CREA
TOPICAL_CREAM | Freq: Two times a day (BID) | CUTANEOUS | 3 refills | Status: DC
Start: 2018-08-08 — End: 2019-02-14

## 2018-08-09 ENCOUNTER — Encounter (INDEPENDENT_AMBULATORY_CARE_PROVIDER_SITE_OTHER): Payer: Self-pay | Admitting: Family Medicine

## 2018-08-09 DIAGNOSIS — L209 Atopic dermatitis, unspecified: Secondary | ICD-10-CM | POA: Insufficient documentation

## 2018-08-09 NOTE — Progress Notes (Signed)
Subjective:    Patient ID: Troy Velasquez is a 78 y.o. male.    Patient is here today for a follow-up visit on his hypertension, hyperlipidemia, and coronary artery disease.  Reports his been doing well.  He is now about 1 year removed from smoking.  Reports his breathing has been doing much better.  No chest pain, shortness of breath, or wheezing.    Does not check blood pressure at home, but his blood pressure has been doing well in the clinic.  No swelling in extremities.  He continues with carvedilol, amlodipine, and lisinopril.    We reviewed his labs.  His current LDL was 73.  Cholesterol has been controlled with Zocor.  No longer follows with cardiology.  He has an aspirin allergy but continues to take Plavix on a daily basis.  No chest pain.    Concerns today for some possible eczema.  He has had some dry skin patches in the past.  Over the last several weeks, he has had some dry, red, itchy skin along the right antecubital fossa      The following portions of the patient's history were reviewed and updated as appropriate: allergies, current medications, past family history, past medical history, past social history, past surgical history and problem list.    Review of Systems   Constitutional: Negative for chills and fever.   HENT: Negative for congestion.    Eyes: Negative for visual disturbance.   Respiratory: Negative for cough, shortness of breath and wheezing.    Cardiovascular: Negative for chest pain and palpitations.   Gastrointestinal: Negative for abdominal pain and nausea.   Genitourinary: Negative for difficulty urinating.   Musculoskeletal: Negative for arthralgias.   Skin: Positive for color change and rash.   Neurological: Negative for dizziness and numbness.   Psychiatric/Behavioral: Negative for sleep disturbance. The patient is not nervous/anxious.            Objective:    Physical Exam  Constitutional:       Appearance: Normal appearance.   HENT:      Head: Normocephalic.       Mouth/Throat:      Mouth: Mucous membranes are moist.   Eyes:      Pupils: Pupils are equal, round, and reactive to light.   Neck:      Musculoskeletal: Normal range of motion.   Cardiovascular:      Rate and Rhythm: Normal rate and regular rhythm.      Heart sounds: Normal heart sounds. No murmur.   Pulmonary:      Effort: Pulmonary effort is normal. No respiratory distress.      Breath sounds: Normal breath sounds.   Abdominal:      General: Bowel sounds are normal.      Palpations: Abdomen is soft.   Musculoskeletal: Normal range of motion.      Right lower leg: No edema.      Left lower leg: No edema.   Skin:     General: Skin is warm and dry.      Findings: Erythema and rash present.   Neurological:      General: No focal deficit present.      Mental Status: He is alert and oriented to person, place, and time.   Psychiatric:         Mood and Affect: Mood normal.         Behavior: Behavior normal.  Assessment:       1. Pulmonary emphysema, unspecified emphysema type    2. Essential hypertension    3. Dyslipidemia    4. Coronary artery disease involving native coronary artery of native heart without angina pectoris    5. Screening PSA (prostate specific antigen)    6. Atopic dermatitis, unspecified type          Plan:       1) COPD is stable.  Patient no longer smoking.  Continue to monitor  2) blood pressure controlled today.  Continue with carvedilol, amlodipine, and lisinopril  3) cholesterol is doing well with an LDL of 73.  Continue Zocor 40 mg daily.  Repeat labs in 6 months  4) CAD is stable.  Continue with Plavix, beta-blocker, statin, and ACE inhibitor  5) orders placed for screening PSA in 6 months  6) triamcinolone cream for atopic dermatitis

## 2018-09-26 ENCOUNTER — Other Ambulatory Visit (INDEPENDENT_AMBULATORY_CARE_PROVIDER_SITE_OTHER): Payer: Self-pay | Admitting: Family Medicine

## 2018-09-26 MED ORDER — BACLOFEN 10 MG PO TABS
10.0000 mg | ORAL_TABLET | Freq: Three times a day (TID) | ORAL | 1 refills | Status: DC
Start: ? — End: 2018-09-26

## 2018-09-26 NOTE — Telephone Encounter (Signed)
baclofen

## 2018-10-05 ENCOUNTER — Encounter (INDEPENDENT_AMBULATORY_CARE_PROVIDER_SITE_OTHER): Payer: Self-pay | Admitting: Physician Assistant

## 2018-10-05 NOTE — Progress Notes (Signed)
scan

## 2019-01-17 ENCOUNTER — Other Ambulatory Visit (INDEPENDENT_AMBULATORY_CARE_PROVIDER_SITE_OTHER): Payer: Self-pay | Admitting: Family Medicine

## 2019-01-31 ENCOUNTER — Other Ambulatory Visit
Admission: RE | Admit: 2019-01-31 | Discharge: 2019-01-31 | Disposition: A | Discharge status: Home / Self Care | Payer: Medicare PPO | Source: Ambulatory Visit | Attending: Family Medicine | Admitting: Family Medicine

## 2019-01-31 DIAGNOSIS — I10 Essential (primary) hypertension: Secondary | ICD-10-CM

## 2019-01-31 DIAGNOSIS — Z125 Encounter for screening for malignant neoplasm of prostate: Secondary | ICD-10-CM

## 2019-01-31 DIAGNOSIS — I2510 Atherosclerotic heart disease of native coronary artery without angina pectoris: Secondary | ICD-10-CM

## 2019-01-31 DIAGNOSIS — E785 Hyperlipidemia, unspecified: Secondary | ICD-10-CM

## 2019-01-31 LAB — LIPID PANEL
Cholesterol: 138 mg/dL (ref 75–199)
Coronary Heart Disease Risk: 3.21
HDL: 43 mg/dL (ref 40–55)
LDL Calculated: 77 mg/dL
Triglycerides: 91 mg/dL (ref 10–150)
VLDL: 18 (ref 0–40)

## 2019-01-31 LAB — COMPREHENSIVE METABOLIC PANEL
ALT: 22 U/L (ref 0–55)
AST (SGOT): 25 U/L (ref 10–42)
Albumin/Globulin Ratio: 1.37 Ratio (ref 0.80–2.00)
Albumin: 3.7 gm/dL (ref 3.5–5.0)
Alkaline Phosphatase: 67 U/L (ref 40–145)
Anion Gap: 11.8 mMol/L (ref 7.0–18.0)
BUN / Creatinine Ratio: 16 Ratio (ref 10.0–30.0)
BUN: 20 mg/dL (ref 7–22)
Bilirubin, Total: 0.5 mg/dL (ref 0.1–1.2)
CO2: 28 mMol/L (ref 20–30)
Calcium: 9.1 mg/dL (ref 8.5–10.5)
Chloride: 108 mMol/L (ref 98–110)
Creatinine: 1.25 mg/dL (ref 0.80–1.30)
EGFR: 56 mL/min/{1.73_m2} — ABNORMAL LOW (ref 60–150)
Globulin: 2.7 gm/dL (ref 2.0–4.0)
Glucose: 93 mg/dL (ref 71–99)
Osmolality Calculated: 287 mOsm/kg (ref 275–300)
Potassium: 4.8 mMol/L (ref 3.5–5.3)
Protein, Total: 6.4 gm/dL (ref 6.0–8.3)
Sodium: 143 mMol/L (ref 136–147)

## 2019-01-31 LAB — PSA: PSA: 0.57 ng/mL (ref 0.000–4.000)

## 2019-02-08 ENCOUNTER — Ambulatory Visit (INDEPENDENT_AMBULATORY_CARE_PROVIDER_SITE_OTHER): Payer: Medicare PPO | Admitting: Family Medicine

## 2019-02-14 ENCOUNTER — Encounter (INDEPENDENT_AMBULATORY_CARE_PROVIDER_SITE_OTHER): Payer: Self-pay | Admitting: Family Medicine

## 2019-02-14 ENCOUNTER — Ambulatory Visit (INDEPENDENT_AMBULATORY_CARE_PROVIDER_SITE_OTHER): Payer: Medicare PPO | Admitting: Family Medicine

## 2019-02-14 VITALS — BP 132/64 | HR 65 | Temp 98.0°F | Resp 18 | Ht 67.0 in | Wt 169.9 lb

## 2019-02-14 DIAGNOSIS — E785 Hyperlipidemia, unspecified: Secondary | ICD-10-CM

## 2019-02-14 DIAGNOSIS — I10 Essential (primary) hypertension: Secondary | ICD-10-CM

## 2019-02-14 DIAGNOSIS — I2510 Atherosclerotic heart disease of native coronary artery without angina pectoris: Secondary | ICD-10-CM

## 2019-02-14 DIAGNOSIS — J439 Emphysema, unspecified: Secondary | ICD-10-CM

## 2019-02-14 NOTE — Progress Notes (Signed)
Subjective:    Patient ID: Troy Velasquez is a 78 y.o. male.    Patient is here today for a follow-up visit on his COPD, hypertension, hyperlipidemia, and coronary disease.  Reports his been doing well.  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  Denies any recent illness.  No fevers or chills.  COPD seems to be stable.  Is not currently using any inhalers.  Patient quit smoking about 2 years ago.  Occasionally gets a dry cough.  No sputum production.    Patient does not check his blood pressure at home.  He has no symptoms such as chest pain, palpitations, or shortness of breath.  He has known coronary disease.  No active symptoms.  He continues with Plavix therapy.    We reviewed his lipid panel.  Cholesterol is doing well.  Continue with Zocor daily basis.    We discussed the influenza and pneumonia vaccine today.  Patient feels he will take these in the future, but he would like to wait.  He is hopeful with a COVID-19 vaccine over the next few weeks.  Patient feels like he would likely vaccinate for pneumonia and influenza after receiving COVID-19 vaccine          The following portions of the patient's history were reviewed and updated as appropriate: allergies, current medications, past family history, past medical history, past social history, past surgical history and problem list.    Review of Systems   Constitutional: Negative for chills and fever.   Eyes: Negative for visual disturbance.   Respiratory: Positive for cough. Negative for shortness of breath.    Cardiovascular: Negative for chest pain and palpitations.   Gastrointestinal: Negative for abdominal pain.   Genitourinary: Negative for difficulty urinating.   Musculoskeletal: Negative for arthralgias and back pain.   Skin: Negative for rash.   Neurological: Negative for dizziness and headaches.   Psychiatric/Behavioral: Negative for sleep disturbance. The patient is not nervous/anxious.            Objective:    Physical  Exam  Constitutional:       Appearance: Normal appearance.   HENT:      Head: Normocephalic.   Eyes:      Pupils: Pupils are equal, round, and reactive to light.   Neck:      Musculoskeletal: Normal range of motion.   Cardiovascular:      Rate and Rhythm: Normal rate and regular rhythm.      Pulses: Normal pulses.      Heart sounds: Normal heart sounds. No murmur.   Pulmonary:      Effort: Pulmonary effort is normal.      Breath sounds: Normal breath sounds. No wheezing.      Comments: Somewhat diminished but otherwise clear.  No wheezing  Musculoskeletal: Normal range of motion.      Right lower leg: No edema.      Left lower leg: No edema.   Skin:     General: Skin is warm and dry.   Neurological:      General: No focal deficit present.      Mental Status: He is alert and oriented to person, place, and time.   Psychiatric:         Mood and Affect: Mood normal.         Behavior: Behavior normal.             Assessment:       1. Essential  hypertension    2. Coronary artery disease involving native coronary artery of native heart without angina pectoris    3. Dyslipidemia    4. Pulmonary emphysema, unspecified emphysema type          Plan:       1) hypertension is controlled.  Continue amlodipine, lisinopril, and carvedilol  2) CAD is stable.  Continue Plavix therapy.  Continue statin therapy and beta-blocker  3) cholesterol is doing well.  Continue with Zocor  4) COPD is stable.  Continue to monitor.  Patient is no longer smoking

## 2019-04-25 ENCOUNTER — Other Ambulatory Visit (INDEPENDENT_AMBULATORY_CARE_PROVIDER_SITE_OTHER): Payer: Self-pay | Admitting: Family Medicine

## 2019-08-10 ENCOUNTER — Other Ambulatory Visit
Admission: RE | Admit: 2019-08-10 | Discharge: 2019-08-10 | Disposition: A | Discharge status: Home / Self Care | Payer: Medicare PPO | Source: Ambulatory Visit | Attending: Family Medicine | Admitting: Family Medicine

## 2019-08-10 DIAGNOSIS — E785 Hyperlipidemia, unspecified: Secondary | ICD-10-CM

## 2019-08-10 DIAGNOSIS — I10 Essential (primary) hypertension: Secondary | ICD-10-CM

## 2019-08-10 DIAGNOSIS — I2510 Atherosclerotic heart disease of native coronary artery without angina pectoris: Secondary | ICD-10-CM

## 2019-08-10 LAB — COMPREHENSIVE METABOLIC PANEL
ALT: 25 U/L (ref 0–55)
AST (SGOT): 28 U/L (ref 10–42)
Albumin/Globulin Ratio: 1.23 Ratio (ref 0.80–2.00)
Albumin: 3.7 gm/dL (ref 3.5–5.0)
Alkaline Phosphatase: 73 U/L (ref 40–145)
Anion Gap: 14.2 mMol/L (ref 7.0–18.0)
BUN / Creatinine Ratio: 15.1 Ratio (ref 10.0–30.0)
BUN: 21 mg/dL (ref 7–22)
Bilirubin, Total: 0.7 mg/dL (ref 0.1–1.2)
CO2: 24.7 mMol/L (ref 20.0–30.0)
Calcium: 9.1 mg/dL (ref 8.5–10.5)
Chloride: 105 mMol/L (ref 98–110)
Creatinine: 1.39 mg/dL — ABNORMAL HIGH (ref 0.80–1.30)
EGFR: 49 mL/min/{1.73_m2} — ABNORMAL LOW (ref 60–150)
Globulin: 3 gm/dL (ref 2.0–4.0)
Glucose: 91 mg/dL (ref 71–99)
Osmolality Calculated: 280 mOsm/kg (ref 275–300)
Potassium: 4.9 mMol/L (ref 3.5–5.3)
Protein, Total: 6.7 gm/dL (ref 6.0–8.3)
Sodium: 139 mMol/L (ref 136–147)

## 2019-08-10 LAB — LIPID PANEL
Cholesterol: 149 mg/dL (ref 75–199)
Coronary Heart Disease Risk: 3.55
HDL: 42 mg/dL (ref 40–55)
LDL Calculated: 85 mg/dL
Triglycerides: 110 mg/dL (ref 10–150)
VLDL: 22 (ref 0–40)

## 2019-08-15 ENCOUNTER — Ambulatory Visit (INDEPENDENT_AMBULATORY_CARE_PROVIDER_SITE_OTHER): Payer: Medicare PPO | Admitting: Family Medicine

## 2019-08-15 ENCOUNTER — Encounter (INDEPENDENT_AMBULATORY_CARE_PROVIDER_SITE_OTHER): Payer: Self-pay | Admitting: Family Medicine

## 2019-08-15 VITALS — BP 140/78 | HR 62 | Temp 98.0°F | Resp 18 | Ht 67.0 in | Wt 166.0 lb

## 2019-08-15 DIAGNOSIS — E785 Hyperlipidemia, unspecified: Secondary | ICD-10-CM

## 2019-08-15 DIAGNOSIS — I10 Essential (primary) hypertension: Secondary | ICD-10-CM

## 2019-08-15 DIAGNOSIS — J439 Emphysema, unspecified: Secondary | ICD-10-CM

## 2019-08-15 DIAGNOSIS — Z125 Encounter for screening for malignant neoplasm of prostate: Secondary | ICD-10-CM

## 2019-08-15 MED ORDER — ALBUTEROL SULFATE HFA 108 (90 BASE) MCG/ACT IN AERS
2.00 | INHALATION_SPRAY | Freq: Four times a day (QID) | RESPIRATORY_TRACT | 5 refills | Status: DC | PRN
Start: 2019-08-15 — End: 2019-12-05

## 2019-08-16 ENCOUNTER — Encounter (INDEPENDENT_AMBULATORY_CARE_PROVIDER_SITE_OTHER): Payer: Self-pay | Admitting: Family Medicine

## 2019-08-16 NOTE — Progress Notes (Signed)
Subjective:    Patient ID: Troy Velasquez is a 79 y.o. male.    Patient is here today for follow-up on his COPD, hypertension, hyperlipidemia, and CAD.  Reports has been doing well.  He continues to work.  No active symptoms.  No chest pain or shortness of breath.    Has some mild concerns of some intermittent wheezing and some intermittent mucus production from his upper airways.  He quit smoking about 2 years ago.  Prior to this has a large pack-year history.      The following portions of the patient's history were reviewed and updated as appropriate: allergies, current medications, past family history, past medical history, past social history, past surgical history and problem list.    Review of Systems   Constitutional: Negative for chills and fever.   Respiratory: Positive for cough and wheezing. Negative for shortness of breath.    Cardiovascular: Negative for chest pain.   Gastrointestinal: Negative for abdominal pain.   Musculoskeletal: Positive for arthralgias.   Skin: Negative for rash.   Neurological: Negative for dizziness and headaches.   Psychiatric/Behavioral: Negative for sleep disturbance. The patient is not nervous/anxious.            Objective:    Physical Exam  Constitutional:       Appearance: Normal appearance.   HENT:      Head: Normocephalic.   Eyes:      Pupils: Pupils are equal, round, and reactive to light.   Cardiovascular:      Rate and Rhythm: Normal rate and regular rhythm.      Heart sounds: Normal heart sounds.   Pulmonary:      Effort: Pulmonary effort is normal. No respiratory distress.      Breath sounds: Normal breath sounds. No wheezing.      Comments: Diminished bilaterally but otherwise clear  Musculoskeletal:      Cervical back: Normal range of motion.      Right lower leg: No edema.      Left lower leg: No edema.   Skin:     General: Skin is warm and dry.   Neurological:      General: No focal deficit present.      Mental Status: He is alert and oriented to person, place,  and time.   Psychiatric:         Mood and Affect: Mood normal.         Behavior: Behavior normal.             Assessment:       1. Essential hypertension    2. Pulmonary emphysema, unspecified emphysema type    3. Dyslipidemia    4. Screening PSA (prostate specific antigen)          Plan:         1) blood pressure stable today.  Continue with lisinopril, carvedilol, and amlodipine  2) COPD seems to be at baseline.  Patient was given a prescription for albuterol inhaler as needed  3) cholesterol is currently controlled.  Continue with Zocor  4) orders placed for screening PSA in 6 months

## 2019-10-27 ENCOUNTER — Other Ambulatory Visit (INDEPENDENT_AMBULATORY_CARE_PROVIDER_SITE_OTHER): Payer: Self-pay | Admitting: Family Medicine

## 2019-11-01 ENCOUNTER — Other Ambulatory Visit (INDEPENDENT_AMBULATORY_CARE_PROVIDER_SITE_OTHER): Payer: Self-pay

## 2019-11-02 MED ORDER — BACLOFEN 10 MG PO TABS
10.00 mg | ORAL_TABLET | Freq: Three times a day (TID) | ORAL | 1 refills | Status: AC
Start: 2019-11-02 — End: 2019-12-02

## 2019-12-05 ENCOUNTER — Other Ambulatory Visit (INDEPENDENT_AMBULATORY_CARE_PROVIDER_SITE_OTHER): Payer: Self-pay | Admitting: Family Medicine

## 2019-12-05 ENCOUNTER — Other Ambulatory Visit (INDEPENDENT_AMBULATORY_CARE_PROVIDER_SITE_OTHER): Payer: Self-pay

## 2019-12-05 MED ORDER — ALBUTEROL SULFATE HFA 108 (90 BASE) MCG/ACT IN AERS
2.0000 | INHALATION_SPRAY | Freq: Four times a day (QID) | RESPIRATORY_TRACT | 5 refills | Status: DC | PRN
Start: ? — End: 2019-12-05

## 2019-12-14 ENCOUNTER — Other Ambulatory Visit (INDEPENDENT_AMBULATORY_CARE_PROVIDER_SITE_OTHER): Payer: Self-pay

## 2019-12-14 MED ORDER — ALBUTEROL SULFATE HFA 108 (90 BASE) MCG/ACT IN AERS
2.0000 | INHALATION_SPRAY | Freq: Four times a day (QID) | RESPIRATORY_TRACT | 5 refills | Status: DC | PRN
Start: ? — End: 2019-12-14

## 2019-12-14 NOTE — Telephone Encounter (Signed)
Wants sent to [facility]

## 2020-01-30 ENCOUNTER — Other Ambulatory Visit (INDEPENDENT_AMBULATORY_CARE_PROVIDER_SITE_OTHER): Payer: Self-pay | Admitting: Family Medicine

## 2020-02-08 ENCOUNTER — Other Ambulatory Visit
Admission: RE | Admit: 2020-02-08 | Discharge: 2020-02-08 | Disposition: A | Discharge status: Home / Self Care | Payer: Medicare PPO | Source: Ambulatory Visit | Attending: Family Medicine | Admitting: Family Medicine

## 2020-02-08 DIAGNOSIS — Z125 Encounter for screening for malignant neoplasm of prostate: Secondary | ICD-10-CM

## 2020-02-08 DIAGNOSIS — I10 Essential (primary) hypertension: Secondary | ICD-10-CM

## 2020-02-08 DIAGNOSIS — E785 Hyperlipidemia, unspecified: Secondary | ICD-10-CM

## 2020-02-08 DIAGNOSIS — J439 Emphysema, unspecified: Secondary | ICD-10-CM

## 2020-02-08 LAB — COMPREHENSIVE METABOLIC PANEL
ALT: 18 U/L (ref 0–55)
AST (SGOT): 20 U/L (ref 10–42)
Albumin/Globulin Ratio: 1.23 Ratio (ref 0.80–2.00)
Albumin: 3.7 gm/dL (ref 3.5–5.0)
Alkaline Phosphatase: 75 U/L (ref 40–145)
Anion Gap: 13.5 mMol/L (ref 7.0–18.0)
BUN / Creatinine Ratio: 14.6 Ratio (ref 10.0–30.0)
BUN: 19 mg/dL (ref 7–22)
Bilirubin, Total: 0.6 mg/dL (ref 0.1–1.2)
CO2: 26 mMol/L (ref 20–30)
Calcium: 9.4 mg/dL (ref 8.5–10.5)
Chloride: 109 mMol/L (ref 98–110)
Creatinine: 1.3 mg/dL (ref 0.80–1.30)
EGFR: 53 mL/min/{1.73_m2} — ABNORMAL LOW (ref 60–150)
Globulin: 3 gm/dL (ref 2.0–4.0)
Glucose: 91 mg/dL (ref 71–99)
Osmolality Calculated: 289 mOsm/kg (ref 275–300)
Potassium: 4.5 mMol/L (ref 3.5–5.3)
Protein, Total: 6.7 gm/dL (ref 6.0–8.3)
Sodium: 144 mMol/L (ref 136–147)

## 2020-02-08 LAB — PSA: PSA: 0.622 ng/mL (ref 0.000–4.000)

## 2020-02-08 LAB — LIPID PANEL
Cholesterol: 132 mg/dL (ref 75–199)
Coronary Heart Disease Risk: 3.47
HDL: 38 mg/dL — ABNORMAL LOW (ref 40–55)
LDL Calculated: 79 mg/dL
Triglycerides: 77 mg/dL (ref 10–150)
VLDL: 15 (ref 0–40)

## 2020-02-14 ENCOUNTER — Encounter (INDEPENDENT_AMBULATORY_CARE_PROVIDER_SITE_OTHER): Payer: Self-pay | Admitting: Family Medicine

## 2020-02-14 ENCOUNTER — Ambulatory Visit (INDEPENDENT_AMBULATORY_CARE_PROVIDER_SITE_OTHER): Payer: Medicare PPO | Admitting: Family Medicine

## 2020-02-14 VITALS — BP 142/60 | HR 63 | Temp 98.6°F | Resp 18 | Ht 67.0 in | Wt 168.0 lb

## 2020-02-14 DIAGNOSIS — I10 Essential (primary) hypertension: Secondary | ICD-10-CM

## 2020-02-14 DIAGNOSIS — J439 Emphysema, unspecified: Secondary | ICD-10-CM

## 2020-02-14 DIAGNOSIS — E785 Hyperlipidemia, unspecified: Secondary | ICD-10-CM

## 2020-02-14 DIAGNOSIS — Z23 Encounter for immunization: Secondary | ICD-10-CM

## 2020-02-14 DIAGNOSIS — I2510 Atherosclerotic heart disease of native coronary artery without angina pectoris: Secondary | ICD-10-CM

## 2020-02-14 MED ORDER — LISINOPRIL 20 MG PO TABS
20.0000 mg | ORAL_TABLET | Freq: Every day | ORAL | 3 refills | Status: DC
Start: ? — End: 2020-02-14

## 2020-02-14 MED ORDER — AMLODIPINE BESYLATE 5 MG PO TABS
5.0000 mg | ORAL_TABLET | Freq: Every day | ORAL | 3 refills | Status: DC
Start: ? — End: 2020-02-14

## 2020-02-14 MED ORDER — SIMVASTATIN 40 MG PO TABS
40.0000 mg | ORAL_TABLET | Freq: Every evening | ORAL | 3 refills | Status: DC
Start: ? — End: 2020-02-14

## 2020-02-14 MED ORDER — CARVEDILOL 12.5 MG PO TABS
12.5000 mg | ORAL_TABLET | Freq: Two times a day (BID) | ORAL | 3 refills | Status: DC
Start: ? — End: 2020-02-14

## 2020-02-14 MED ORDER — CLOPIDOGREL BISULFATE 75 MG PO TABS
75.0000 mg | ORAL_TABLET | Freq: Every day | ORAL | 3 refills | Status: DC
Start: ? — End: 2020-02-14

## 2020-02-14 NOTE — Progress Notes (Signed)
Subjective:    Patient ID: Troy Velasquez is a 79 y.o. male.    Patient is here for a follow up.  Reports he is doing well.  No chest pain or shortness of breath.  He used inhaler about 1 time daily.  We reviewed all his labs.  Lipid panel is at goal.  BP does well at home. Screening psa is within normal limits.  He would like the flu vaccine.  He had both covid vaccines       The following portions of the patient's history were reviewed and updated as appropriate: allergies, current medications, past family history, past medical history, past social history, past surgical history and problem list.    Review of Systems   Constitutional: Negative for chills and fever.   Eyes: Negative for visual disturbance.   Respiratory: Positive for cough. Negative for shortness of breath and wheezing.    Cardiovascular: Negative for chest pain, palpitations and leg swelling.   Musculoskeletal: Negative for arthralgias.   Skin: Negative for rash.   Neurological: Negative for dizziness.   Psychiatric/Behavioral: The patient is not nervous/anxious.            Objective:    Physical Exam  Constitutional:       Appearance: Normal appearance.   HENT:      Head: Normocephalic.   Eyes:      Pupils: Pupils are equal, round, and reactive to light.   Cardiovascular:      Rate and Rhythm: Normal rate and regular rhythm.      Heart sounds: Normal heart sounds. No murmur heard.      Pulmonary:      Effort: Pulmonary effort is normal.      Breath sounds: Normal breath sounds. No wheezing.   Musculoskeletal:      Right lower leg: No edema.      Left lower leg: No edema.   Skin:     General: Skin is warm and dry.   Neurological:      General: No focal deficit present.      Mental Status: He is alert and oriented to person, place, and time.   Psychiatric:         Mood and Affect: Mood normal.         Behavior: Behavior normal.             Assessment:       1. Primary hypertension    2. Dyslipidemia    3. Pulmonary emphysema, unspecified emphysema  type    4. Coronary artery disease involving native coronary artery of native heart without angina pectoris    5. Immunization due          Plan:       1)  BP is stable continue with norvasc, coreg and lisinopril  2)  Lipids at goal.  Keep with zocor  3)  COPD is stable.  Keep with albuterol   4)  CAD is stable.  Keep with plavix, asa, ace-I and beta blocker  5)  Updated flu vaccine today

## 2020-02-14 NOTE — Progress Notes (Signed)
Patient gave verbal consent to receive vaccination. Patient was given an IM injection. Patient tolerated well. Patient was given patient education on any side effects.    Ann Groeneveld M Neyra Pettie, MA

## 2020-02-15 ENCOUNTER — Encounter (INDEPENDENT_AMBULATORY_CARE_PROVIDER_SITE_OTHER): Payer: Self-pay | Admitting: Family Medicine

## 2020-03-05 ENCOUNTER — Telehealth (INDEPENDENT_AMBULATORY_CARE_PROVIDER_SITE_OTHER): Payer: Medicare PPO | Admitting: Family Medicine

## 2020-03-05 ENCOUNTER — Encounter (INDEPENDENT_AMBULATORY_CARE_PROVIDER_SITE_OTHER): Payer: Self-pay | Admitting: Family Medicine

## 2020-03-05 VITALS — Ht 67.0 in | Wt 168.0 lb

## 2020-03-05 DIAGNOSIS — J441 Chronic obstructive pulmonary disease with (acute) exacerbation: Secondary | ICD-10-CM

## 2020-03-05 MED ORDER — AZITHROMYCIN 250 MG PO TABS
ORAL_TABLET | ORAL | 0 refills | Status: DC
Start: ? — End: 2020-03-05

## 2020-03-05 MED ORDER — PREDNISONE 20 MG PO TABS
ORAL_TABLET | ORAL | 0 refills | Status: DC
Start: ? — End: 2020-03-05

## 2020-03-05 NOTE — Progress Notes (Signed)
Subjective:    Patient ID: Troy Velasquez is a 80 y.o. male.    Verbal consent has been obtained from the patient to conduct a telephone: yes  Telemedicine Documentation Requirements    Originating site (Patient location): Home in [place]  Distant site (Provider location): [facility] [place]  Provider and Title: Sid Falcon, MD  Consent obtained: YES/NO: Yes  Language, if applicable and if translator was required: English    Patient receiving a telephone call today discuss his respiratory symptoms.  Symptoms started about 6 or 7 days ago.  Had a mild cold consisting of some rhinorrhea, sore throat, congestion.  Over the last 2 days, symptoms are more in his chest.  He has a cough, some wheezing, chest congestion.  Cough occasionally produces some yellow sputum.  Patient is vaccinated for COVID-19.  No fevers or chills.  No loss sense of taste or smell.  Does have fairly significant COPD      The following portions of the patient's history were reviewed and updated as appropriate: allergies, current medications, past family history, past medical history, past social history, past surgical history and problem list.    Review of Systems   Constitutional: Negative for chills and fever.   HENT: Positive for congestion and sore throat.    Respiratory: Positive for cough and wheezing. Negative for shortness of breath.    Cardiovascular: Negative for chest pain.   Neurological: Negative for dizziness.           Objective:    Physical Exam  Neurological:      Mental Status: He is alert and oriented to person, place, and time.   Psychiatric:         Behavior: Behavior normal.             Assessment:       1. COPD exacerbation          Plan:       1) we discussed a course of azithromycin and prednisone for likely COPD exacerbation.  Discussed the possibility of COVID although patient has been vaccinated, he is already outside of the 5-day quarantine window    10-minute spent conducting  telephone interview informing above assessment and plan

## 2020-05-13 ENCOUNTER — Other Ambulatory Visit (INDEPENDENT_AMBULATORY_CARE_PROVIDER_SITE_OTHER): Payer: Self-pay | Admitting: Family Medicine

## 2020-06-19 ENCOUNTER — Ambulatory Visit (INDEPENDENT_AMBULATORY_CARE_PROVIDER_SITE_OTHER): Payer: Medicare PPO | Admitting: Family Medicine

## 2020-06-19 ENCOUNTER — Encounter (INDEPENDENT_AMBULATORY_CARE_PROVIDER_SITE_OTHER): Payer: Self-pay | Admitting: Family Medicine

## 2020-06-19 VITALS — BP 140/65 | HR 60 | Temp 98.5°F | Resp 18 | Ht 67.0 in | Wt 163.0 lb

## 2020-06-19 DIAGNOSIS — R040 Epistaxis: Secondary | ICD-10-CM

## 2020-06-19 NOTE — Progress Notes (Unsigned)
Subjective:    Patient ID: Troy Velasquez is a 80 y.o. male.    Patient is here today for a chief complaint of nosebleeds.  Over the last week, has had 2 episodes of nasal bleeding.  One episode was controlled with some light pressure.  Reports the other episode took about 30 minutes to control his bleeding.  Denies any bleeding over the last few days.  He does get some issues with seasonal allergies.  Patient concerns of bleeding may be related to his medication.  He takes Plavix for his history of coronary disease since he has an aspirin allergy.  Patient stopped Plavix for a few days.  Reports he is now back on Plavix.  No chest pain or shortness of breath      The following portions of the patient's history were reviewed and updated as appropriate: allergies, current medications, past family history, past medical history, past social history, past surgical history and problem list.    Review of Systems   Constitutional: Negative for chills and fever.   HENT: Positive for nosebleeds.    Respiratory: Negative for shortness of breath.    Cardiovascular: Negative for chest pain.   Skin: Negative for rash.   Neurological: Negative for dizziness and headaches.           Objective:    Physical Exam  Constitutional:       Appearance: Normal appearance.   HENT:      Head: Normocephalic.      Nose: No congestion.      Comments: Irritation along the nasal septum bilaterally  Eyes:      Pupils: Pupils are equal, round, and reactive to light.   Cardiovascular:      Rate and Rhythm: Normal rate and regular rhythm.      Heart sounds: Normal heart sounds. No murmur heard.  Pulmonary:      Effort: Pulmonary effort is normal.      Breath sounds: Normal breath sounds.   Skin:     General: Skin is warm and dry.   Neurological:      General: No focal deficit present.      Mental Status: He is alert and oriented to person, place, and time.   Psychiatric:         Mood and Affect: Mood normal.         Behavior: Behavior normal.              Assessment:       1. Bleeding nose          Plan:       Discussed that the nosebleeding seems to be coming from some irritation and dryness along the nasal septum.  Suggested using Ponaris, nasal emollient.  Continue with his Plavix.  Follow-up if worsening

## 2020-06-20 ENCOUNTER — Encounter (INDEPENDENT_AMBULATORY_CARE_PROVIDER_SITE_OTHER): Payer: Self-pay | Admitting: Family Medicine

## 2020-08-08 ENCOUNTER — Other Ambulatory Visit
Admission: RE | Admit: 2020-08-08 | Discharge: 2020-08-08 | Disposition: A | Discharge status: Home / Self Care | Payer: Medicare PPO | Source: Ambulatory Visit | Attending: Family Medicine | Admitting: Family Medicine

## 2020-08-08 DIAGNOSIS — I10 Essential (primary) hypertension: Secondary | ICD-10-CM

## 2020-08-08 DIAGNOSIS — E785 Hyperlipidemia, unspecified: Secondary | ICD-10-CM

## 2020-08-08 LAB — COMPREHENSIVE METABOLIC PANEL
ALT: 17 U/L (ref 0–55)
AST (SGOT): 22 U/L (ref 10–42)
Albumin/Globulin Ratio: 1.37 Ratio (ref 0.80–2.00)
Albumin: 3.7 gm/dL (ref 3.5–5.0)
Alkaline Phosphatase: 69 U/L (ref 40–145)
Anion Gap: 11.5 mMol/L (ref 7.0–18.0)
BUN / Creatinine Ratio: 16.1 Ratio (ref 10.0–30.0)
BUN: 20 mg/dL (ref 7–22)
Bilirubin, Total: 0.5 mg/dL (ref 0.1–1.2)
CO2: 27 mMol/L (ref 20–30)
Calcium: 9.5 mg/dL (ref 8.5–10.5)
Chloride: 106 mMol/L (ref 98–110)
Creatinine: 1.24 mg/dL (ref 0.80–1.30)
EGFR: 60 mL/min/{1.73_m2} (ref 60–150)
Globulin: 2.7 gm/dL (ref 2.0–4.0)
Glucose: 92 mg/dL (ref 71–99)
Osmolality Calculated: 282 mOsm/kg (ref 275–300)
Potassium: 4.5 mMol/L (ref 3.5–5.3)
Protein, Total: 6.4 gm/dL (ref 6.0–8.3)
Sodium: 140 mMol/L (ref 136–147)

## 2020-08-08 LAB — LIPID PANEL
Cholesterol: 148 mg/dL (ref 75–199)
Coronary Heart Disease Risk: 3.89
HDL: 38 mg/dL — ABNORMAL LOW (ref 40–55)
LDL Calculated: 92 mg/dL
Triglycerides: 90 mg/dL (ref 10–150)
VLDL: 18 (ref 0–40)

## 2020-08-14 ENCOUNTER — Ambulatory Visit (INDEPENDENT_AMBULATORY_CARE_PROVIDER_SITE_OTHER): Payer: Medicare PPO | Admitting: Family Medicine

## 2020-09-01 ENCOUNTER — Encounter (INDEPENDENT_AMBULATORY_CARE_PROVIDER_SITE_OTHER): Payer: Self-pay | Admitting: Family Medicine

## 2020-09-01 ENCOUNTER — Ambulatory Visit (INDEPENDENT_AMBULATORY_CARE_PROVIDER_SITE_OTHER): Payer: Medicare PPO | Admitting: Family Medicine

## 2020-09-01 VITALS — BP 124/56 | HR 67 | Temp 98.2°F | Resp 18 | Ht 67.0 in | Wt 162.0 lb

## 2020-09-01 DIAGNOSIS — G4700 Insomnia, unspecified: Secondary | ICD-10-CM

## 2020-09-01 DIAGNOSIS — E785 Hyperlipidemia, unspecified: Secondary | ICD-10-CM

## 2020-09-01 DIAGNOSIS — J439 Emphysema, unspecified: Secondary | ICD-10-CM

## 2020-09-01 DIAGNOSIS — I10 Essential (primary) hypertension: Secondary | ICD-10-CM

## 2020-09-01 DIAGNOSIS — Z125 Encounter for screening for malignant neoplasm of prostate: Secondary | ICD-10-CM

## 2020-09-01 MED ORDER — ZOLPIDEM TARTRATE 5 MG PO TABS
5.0000 mg | ORAL_TABLET | Freq: Every evening | ORAL | 2 refills | Status: DC | PRN
Start: ? — End: 2020-09-01

## 2020-09-01 NOTE — Progress Notes (Signed)
Subjective:    Patient ID: Troy Velasquez is a 80 y.o. male.    Patient is here today for follow-up on his blood pressure, cholesterol, COPD, and insomnia.  Reports he has been doing well.  He continues to work.  He continues to active.  Denies any chest pain or shortness of breath.  No recent exacerbations of his COPD.  He does continue with albuterol as needed     We reviewed his lipid panel.  Cholesterol is controlled with simvastatin     blood pressure is stable with lisinopril and carvedilol along with amlodipine    Patient has concerns now of worsening insomnia.  He has had insomnia over the last few years.  He has tried several medications over-the-counter with minimal success.  He is only getting about 2 or 3 hours of sleep per night          The following portions of the patient's history were reviewed and updated as appropriate: allergies, current medications, past family history, past medical history, past social history, past surgical history, and problem list.    Review of Systems   Constitutional:  Negative for chills and fever.   Eyes:  Negative for visual disturbance.   Respiratory:  Negative for shortness of breath.    Cardiovascular:  Negative for chest pain and palpitations.   Skin:  Negative for rash.   Neurological:  Negative for dizziness and headaches.   Psychiatric/Behavioral:  Positive for sleep disturbance. The patient is not nervous/anxious.          Objective:    Physical Exam  Constitutional:       Appearance: Normal appearance.   HENT:      Head: Normocephalic.   Eyes:      Pupils: Pupils are equal, round, and reactive to light.   Cardiovascular:      Rate and Rhythm: Normal rate and regular rhythm.      Heart sounds: Normal heart sounds. No murmur heard.  Pulmonary:      Effort: Pulmonary effort is normal.      Breath sounds: Normal breath sounds. No wheezing.   Musculoskeletal:      Right lower leg: No edema.      Left lower leg: No edema.   Skin:     General: Skin is warm and dry.    Neurological:      General: No focal deficit present.      Mental Status: He is alert and oriented to person, place, and time.   Psychiatric:         Mood and Affect: Mood normal.         Behavior: Behavior normal.         Assessment:       1. Primary hypertension    2. Pulmonary emphysema, unspecified emphysema type    3. Dyslipidemia    4. Screening PSA (prostate specific antigen)    5. Insomnia, unspecified type          Plan:         Blood pressure stable.  Continue with amlodipine, carvedilol, and lisinopril   COPD is stable.  Continue with albuterol as needed cholesterol was stable.    Continue with simvastatin.  Recheck labs in 6 months   Orders placed for screening PSA  We discussed a trial of Ambien for worsening insomnia

## 2020-10-07 ENCOUNTER — Ambulatory Visit (INDEPENDENT_AMBULATORY_CARE_PROVIDER_SITE_OTHER): Payer: Medicare PPO | Admitting: Family

## 2020-10-07 ENCOUNTER — Encounter (INDEPENDENT_AMBULATORY_CARE_PROVIDER_SITE_OTHER): Payer: Self-pay | Admitting: Family

## 2020-10-07 VITALS — BP 142/74 | HR 55 | Temp 98.4°F | Resp 18 | Ht 67.0 in | Wt 163.0 lb

## 2020-10-07 DIAGNOSIS — E785 Hyperlipidemia, unspecified: Secondary | ICD-10-CM

## 2020-10-07 DIAGNOSIS — I2510 Atherosclerotic heart disease of native coronary artery without angina pectoris: Secondary | ICD-10-CM

## 2020-10-07 DIAGNOSIS — I10 Essential (primary) hypertension: Secondary | ICD-10-CM

## 2020-10-07 DIAGNOSIS — J439 Emphysema, unspecified: Secondary | ICD-10-CM

## 2020-10-07 DIAGNOSIS — R55 Syncope and collapse: Secondary | ICD-10-CM

## 2020-10-07 NOTE — Progress Notes (Signed)
Subjective:    Patient ID: Troy Velasquez is a 80 y.o. male.    HPI    Patient presented in office today for near syncope. He reports that he felt like he was going to pass out on Saturday while he was bagging potatoes outside.     He reports that he was bagging potatoes then felt like he was going to pass out, lasted one minute, drank water and felt somewhat better when he got in the house where it was cooler and he felt better. He denies any chest pain, tachycardia, palpitations, shortness of breath. He states that he broke out in a cold sweat for a couple seconds and felt like he was going to pass out. No leg pain or swelling. No back pain. He reports that this was the only episode of feeling near syncope. He denies feeling ill with fever, rhinorrhea, congestion, headaches. He has a productive cough and reports this is normal for him due to copd. He reports that he feels fine now. He states that occasionally, once per day he "feels bad" and cannot describe exactly what bad means but states that it is not like his initial episode on Saturday. He reports that his bp at home has been ranging from 140s sytolic to 150s sytolic. He reports no low bp readings. He cannot attribute his symptoms to position changes or symptoms with exertion. He denies current symptoms or chest pain.     He has a history of CAD, HTN and takes amlodipine 5 mg daily, lisinopril 20 mg daily, carvedilol 12.5 mg bid, and plavix 75 mg daily. Cholesterol is well controlled with simvastatin 40 mg daily. He states that he was instructed to get a stress test in the past but declines it.  He has a history of copd and takes albuterol as needed.  His recent labs in June showed a normal CMP.    The following portions of the patient's history were reviewed and updated as appropriate: allergies, current medications, past family history, past medical history, past social history, past surgical history, and problem list.    Review of Systems   Constitutional:   Negative for diaphoresis, fatigue and fever.   HENT:  Negative for congestion, ear pain, rhinorrhea and sore throat.    Respiratory:  Positive for cough. Negative for chest tightness, shortness of breath and wheezing.    Cardiovascular:  Negative for chest pain and palpitations.   Gastrointestinal:  Negative for abdominal pain, blood in stool, diarrhea, nausea and vomiting.   Genitourinary:  Negative for difficulty urinating, dysuria, frequency and urgency.   Musculoskeletal:  Negative for back pain.   Skin:  Negative for color change and pallor.   Neurological:  Negative for seizures, syncope, weakness and numbness.         Objective:    Physical Exam  Constitutional:       General: He is not in acute distress.     Appearance: Normal appearance.   HENT:      Head: Normocephalic and atraumatic.      Nose: Nose normal.      Mouth/Throat:      Mouth: Mucous membranes are moist.      Pharynx: Oropharynx is clear.   Eyes:      General: No scleral icterus.        Right eye: No discharge.         Left eye: No discharge.      Conjunctiva/sclera: Conjunctivae normal.  Pupils: Pupils are equal, round, and reactive to light.   Neck:      Vascular: No carotid bruit.   Cardiovascular:      Rate and Rhythm: Regular rhythm. Bradycardia present.      Pulses: Normal pulses.      Heart sounds: Normal heart sounds. No murmur heard.  Pulmonary:      Effort: Pulmonary effort is normal. No respiratory distress.      Breath sounds: Normal breath sounds.   Abdominal:      General: Bowel sounds are normal.      Palpations: Abdomen is soft.      Tenderness: There is no abdominal tenderness.   Musculoskeletal:         General: Normal range of motion.      Cervical back: Normal range of motion and neck supple.      Right lower leg: No edema.      Left lower leg: No edema.   Skin:     General: Skin is warm and dry.      Capillary Refill: Capillary refill takes less than 2 seconds.   Neurological:      General: No focal deficit present.       Mental Status: He is alert and oriented to person, place, and time. Mental status is at baseline.   Psychiatric:         Mood and Affect: Mood normal.         Behavior: Behavior normal.     BP 142/74    Pulse (!) 55    Temp 98.4 F (36.9 C) (Temporal)    Resp 18    Ht 1.702 m (5\' 7" )    Wt 73.9 kg (163 lb)    SpO2 98%    BMI 25.53 kg/m     BP sitting 150/62, standing 142/60      Assessment:       1. Near syncope    2. Coronary artery disease involving native coronary artery of native heart without angina pectoris    3. Primary hypertension    4. Dyslipidemia    5. Pulmonary emphysema, unspecified emphysema type          Plan:       Reviewed labs and visit notes. Ekg showed nsr, rate 55.   2.   Due to symptomatic bradycardia, will decrease carvedilol from 12.5 to 6.25 mg twice daily.  We discussed ordering echo and Holter monitor if symptoms do not resolve with medication change.  Patient to continue monitoring blood pressure and heart rate at home.  3.  Patient has been having blood pressure readings ranging from 140s 150s systolic.  Since we are decreasing carvedilol dosing we are going to increase lisinopril from 20 mg to 30 mg daily.  4.  Has been well controlled with simvastatin.  5.  Well-controlled with albuterol as needed.  Patient was instructed to follow-up in 1 week for reevaluation or call office sooner with any worsening symptoms or concerns.  Patient was instructed to go to the ER for development of chest pain or worsening symptoms.

## 2020-10-15 ENCOUNTER — Encounter (INDEPENDENT_AMBULATORY_CARE_PROVIDER_SITE_OTHER): Payer: Self-pay | Admitting: Family Medicine

## 2020-10-15 ENCOUNTER — Ambulatory Visit (INDEPENDENT_AMBULATORY_CARE_PROVIDER_SITE_OTHER): Payer: Medicare PPO | Admitting: Family Medicine

## 2020-10-15 VITALS — BP 148/62 | HR 67 | Temp 98.6°F | Resp 18 | Ht 67.0 in | Wt 160.0 lb

## 2020-10-15 DIAGNOSIS — I10 Essential (primary) hypertension: Secondary | ICD-10-CM

## 2020-10-15 DIAGNOSIS — R001 Bradycardia, unspecified: Secondary | ICD-10-CM

## 2020-10-15 MED ORDER — LISINOPRIL 30 MG PO TABS
30.0000 mg | ORAL_TABLET | Freq: Every day | ORAL | 3 refills | Status: DC
Start: ? — End: 2020-10-15

## 2020-10-15 MED ORDER — CARVEDILOL 6.25 MG PO TABS
6.2500 mg | ORAL_TABLET | Freq: Two times a day (BID) | ORAL | 3 refills | Status: DC
Start: ? — End: 2020-10-15

## 2020-10-15 NOTE — Progress Notes (Signed)
Subjective:    Patient ID: Troy Velasquez is a 80 y.o. male.    Patient is here today for a follow-up on his symptomatic bradycardia and blood pressure.  He was seen last week by nurse practitioner.  Patient had a near syncopal episode while harvesting potatoes.  It took several minutes before he felt that he was back to his normal state.  In the clinic, his blood pressure was slightly elevated, but his heart rate was in the 50s.  Patient has been reducing his carvedilol from 12.5 mg to 6.25 mg twice daily.  They also increased his lisinopril from 20 mg to 30 mg.  We reviewed his blood pressure readings from home.  For the most part systolic blood pressure staying around 130-140.  Heart rate has been increasing from the 50s into the mid 60s over the last week.  No more syncopal episodes.  No chest pain or shortness of breath      The following portions of the patient's history were reviewed and updated as appropriate: allergies, current medications, past family history, past medical history, past social history, past surgical history, and problem list.    Review of Systems   Constitutional:  Negative for chills and fever.   Respiratory:  Negative for shortness of breath.    Cardiovascular:  Negative for chest pain, palpitations and leg swelling.   Neurological:  Negative for dizziness, syncope, facial asymmetry, weakness and headaches.         Objective:    Physical Exam  Constitutional:       Appearance: Normal appearance.   HENT:      Head: Normocephalic.   Eyes:      Pupils: Pupils are equal, round, and reactive to light.   Cardiovascular:      Rate and Rhythm: Normal rate and regular rhythm.      Heart sounds: Normal heart sounds. No murmur heard.  Pulmonary:      Effort: Pulmonary effort is normal.      Breath sounds: Normal breath sounds.   Musculoskeletal:      Right lower leg: No edema.      Left lower leg: No edema.   Skin:     General: Skin is warm and dry.   Neurological:      General: No focal deficit  present.      Mental Status: He is alert and oriented to person, place, and time.   Psychiatric:         Mood and Affect: Mood normal.         Behavior: Behavior normal.         Assessment:       1. Symptomatic bradycardia    2. Primary hypertension          Plan:         Symptomatic bradycardia has resolved.  Reviewed his pulse rates from home.  Continue with carvedilol at 6.25 mg twice daily.  Refills were given  Blood pressure stable.  Continue his new blood pressure regimen consisting of amlodipine 5 mg daily, lisinopril 30 mg daily and carvedilol 6.25 mg daily.  Refills were given for lisinopril 30 mg daily

## 2020-11-13 ENCOUNTER — Ambulatory Visit (INDEPENDENT_AMBULATORY_CARE_PROVIDER_SITE_OTHER): Payer: Medicare PPO | Admitting: Family Medicine

## 2020-11-13 ENCOUNTER — Encounter (INDEPENDENT_AMBULATORY_CARE_PROVIDER_SITE_OTHER): Payer: Self-pay | Admitting: Family Medicine

## 2020-11-13 ENCOUNTER — Other Ambulatory Visit (INDEPENDENT_AMBULATORY_CARE_PROVIDER_SITE_OTHER): Payer: Self-pay

## 2020-11-13 VITALS — BP 132/58 | HR 68 | Temp 97.9°F | Resp 19 | Ht 67.0 in | Wt 162.0 lb

## 2020-11-13 DIAGNOSIS — J439 Emphysema, unspecified: Secondary | ICD-10-CM

## 2020-11-13 MED ORDER — PREDNISONE 20 MG PO TABS
ORAL_TABLET | ORAL | 0 refills | Status: DC
Start: ? — End: 2020-11-13

## 2020-11-13 MED ORDER — AZITHROMYCIN 250 MG PO TABS
ORAL_TABLET | ORAL | 0 refills | Status: DC
Start: ? — End: 2020-11-13

## 2020-11-13 MED ORDER — ZOLPIDEM TARTRATE 5 MG PO TABS
5.0000 mg | ORAL_TABLET | Freq: Every evening | ORAL | 2 refills | Status: DC | PRN
Start: ? — End: 2020-11-13

## 2020-11-13 NOTE — Progress Notes (Signed)
Subjective:    Patient ID: Troy Velasquez is a 80 y.o. male.    Patient is here today for an acute visit.  Over the last week, he has had some worsening cough, wheezing, and chest congestion.  Symptoms seem to be worse in the morning.  In the morning he is often coughing some yellow sputum.  He does have known COPD.  Been using his albuterol inhaler with minimal success      The following portions of the patient's history were reviewed and updated as appropriate: allergies, current medications, past family history, past medical history, past social history, past surgical history, and problem list.    Review of Systems   Constitutional:  Negative for chills and fever.   HENT:  Positive for congestion.    Respiratory:  Positive for cough and wheezing. Negative for shortness of breath.    Cardiovascular:  Negative for chest pain.         Objective:    Physical Exam  Constitutional:       Appearance: Normal appearance.   Cardiovascular:      Rate and Rhythm: Normal rate and regular rhythm.      Heart sounds: Normal heart sounds. No murmur heard.  Pulmonary:      Effort: Pulmonary effort is normal.      Breath sounds: Rhonchi present.   Skin:     General: Skin is dry.   Neurological:      General: No focal deficit present.      Mental Status: He is alert and oriented to person, place, and time.   Psychiatric:         Behavior: Behavior normal.         Assessment:       1. Pulmonary emphysema, unspecified emphysema type          Plan:       COPD is likely an acute exacerbation.  Prescription given for prednisone and azithromycin.  Continue with albuterol.  Follow-up if worsening

## 2020-11-18 ENCOUNTER — Other Ambulatory Visit (INDEPENDENT_AMBULATORY_CARE_PROVIDER_SITE_OTHER): Payer: Self-pay

## 2020-12-23 ENCOUNTER — Other Ambulatory Visit (INDEPENDENT_AMBULATORY_CARE_PROVIDER_SITE_OTHER): Payer: Self-pay | Admitting: Family Medicine

## 2020-12-23 DIAGNOSIS — R040 Epistaxis: Secondary | ICD-10-CM

## 2021-01-21 ENCOUNTER — Ambulatory Visit (INDEPENDENT_AMBULATORY_CARE_PROVIDER_SITE_OTHER): Payer: Medicare PPO | Admitting: Family Medicine

## 2021-01-21 ENCOUNTER — Encounter (INDEPENDENT_AMBULATORY_CARE_PROVIDER_SITE_OTHER): Payer: Self-pay | Admitting: Family Medicine

## 2021-01-21 VITALS — BP 160/64 | HR 71 | Temp 99.0°F | Resp 19 | Ht 67.0 in | Wt 164.0 lb

## 2021-01-21 DIAGNOSIS — J441 Chronic obstructive pulmonary disease with (acute) exacerbation: Secondary | ICD-10-CM

## 2021-01-21 MED ORDER — PREDNISONE 20 MG PO TABS
ORAL_TABLET | ORAL | 0 refills | Status: DC
Start: ? — End: 2021-01-21

## 2021-01-21 MED ORDER — AZITHROMYCIN 250 MG PO TABS
ORAL_TABLET | ORAL | 0 refills | Status: DC
Start: ? — End: 2021-01-21

## 2021-01-21 NOTE — Progress Notes (Signed)
Subjective:    Patient ID: Troy Velasquez is a 80 y.o. male.    Patient is here today for an acute visit.  Over the last for 5 days, worsening cough, chest congestion, sore throat and wheezing.  Multiple family members with similar episodes.  No fevers or chills.  Cough is mainly nonproductive.  Throat is described as a painful sensation that gets worse with swallowing.  Patient does have known COPD.  Has been using his albuterol inhaler with minimal success      The following portions of the patient's history were reviewed and updated as appropriate: allergies, current medications, past family history, past medical history, past social history, past surgical history, and problem list.    Review of Systems   Constitutional:  Negative for chills and fever.   HENT:  Positive for congestion and sore throat.    Eyes:  Negative for visual disturbance.   Respiratory:  Positive for cough. Negative for shortness of breath.    Cardiovascular:  Negative for chest pain.   Neurological:  Negative for dizziness.         Objective:    Physical Exam  Constitutional:       Appearance: Normal appearance.   HENT:      Head: Normocephalic.      Nose: Congestion present.      Mouth/Throat:      Pharynx: Posterior oropharyngeal erythema present. No oropharyngeal exudate.   Cardiovascular:      Rate and Rhythm: Normal rate and regular rhythm.      Heart sounds: Normal heart sounds. No murmur heard.  Pulmonary:      Effort: Pulmonary effort is normal.      Breath sounds: Wheezing and rhonchi present.   Skin:     General: Skin is warm and dry.   Neurological:      General: No focal deficit present.      Mental Status: He is alert and oriented to person, place, and time.   Psychiatric:         Mood and Affect: Mood normal.         Behavior: Behavior normal.         Assessment:       1. COPD exacerbation          Plan:       Discussed a course of azithromycin and prednisone for COPD exacerbation.  Continue with albuterol

## 2021-01-22 ENCOUNTER — Other Ambulatory Visit: Payer: Self-pay

## 2021-01-22 ENCOUNTER — Encounter (HOSPITAL_COMMUNITY): Payer: Self-pay

## 2021-01-22 ENCOUNTER — Inpatient Hospital Stay
Admission: EM | Admit: 2021-01-22 | Discharge: 2021-01-27 | DRG: 247 | Disposition: A | Discharge status: Home / Self Care | Payer: Commercial Managed Care - PPO | Attending: Internal Medicine | Admitting: Internal Medicine

## 2021-01-22 ENCOUNTER — Emergency Department (EMERGENCY_DEPARTMENT_HOSPITAL): Payer: Commercial Managed Care - PPO

## 2021-01-22 DIAGNOSIS — B348 Other viral infections of unspecified site: Secondary | ICD-10-CM | POA: Diagnosis present

## 2021-01-22 DIAGNOSIS — I252 Old myocardial infarction: Secondary | ICD-10-CM

## 2021-01-22 DIAGNOSIS — N289 Disorder of kidney and ureter, unspecified: Secondary | ICD-10-CM

## 2021-01-22 DIAGNOSIS — Z79899 Other long term (current) drug therapy: Secondary | ICD-10-CM

## 2021-01-22 DIAGNOSIS — E785 Hyperlipidemia, unspecified: Secondary | ICD-10-CM | POA: Diagnosis present

## 2021-01-22 DIAGNOSIS — R079 Chest pain, unspecified: Secondary | ICD-10-CM

## 2021-01-22 DIAGNOSIS — J449 Chronic obstructive pulmonary disease, unspecified: Secondary | ICD-10-CM | POA: Insufficient documentation

## 2021-01-22 DIAGNOSIS — F17210 Nicotine dependence, cigarettes, uncomplicated: Secondary | ICD-10-CM | POA: Diagnosis present

## 2021-01-22 DIAGNOSIS — I2510 Atherosclerotic heart disease of native coronary artery without angina pectoris: Secondary | ICD-10-CM | POA: Insufficient documentation

## 2021-01-22 DIAGNOSIS — I472 Ventricular tachycardia, unspecified: Secondary | ICD-10-CM | POA: Diagnosis not present

## 2021-01-22 DIAGNOSIS — R059 Cough, unspecified: Secondary | ICD-10-CM

## 2021-01-22 DIAGNOSIS — G4700 Insomnia, unspecified: Secondary | ICD-10-CM | POA: Insufficient documentation

## 2021-01-22 DIAGNOSIS — E7800 Pure hypercholesterolemia, unspecified: Secondary | ICD-10-CM | POA: Diagnosis present

## 2021-01-22 DIAGNOSIS — I214 Non-ST elevation (NSTEMI) myocardial infarction: Secondary | ICD-10-CM | POA: Insufficient documentation

## 2021-01-22 DIAGNOSIS — I10 Essential (primary) hypertension: Secondary | ICD-10-CM | POA: Diagnosis present

## 2021-01-22 DIAGNOSIS — Z7902 Long term (current) use of antithrombotics/antiplatelets: Secondary | ICD-10-CM

## 2021-01-22 DIAGNOSIS — R7303 Prediabetes: Secondary | ICD-10-CM | POA: Diagnosis present

## 2021-01-22 DIAGNOSIS — Z951 Presence of aortocoronary bypass graft: Secondary | ICD-10-CM

## 2021-01-22 DIAGNOSIS — Z955 Presence of coronary angioplasty implant and graft: Secondary | ICD-10-CM

## 2021-01-22 DIAGNOSIS — N179 Acute kidney failure, unspecified: Secondary | ICD-10-CM | POA: Diagnosis present

## 2021-01-22 DIAGNOSIS — R7301 Impaired fasting glucose: Secondary | ICD-10-CM | POA: Insufficient documentation

## 2021-01-22 DIAGNOSIS — Z9861 Coronary angioplasty status: Secondary | ICD-10-CM

## 2021-01-22 LAB — CBC WITH DIFF
BASOPHIL #: <0.1 10*3/uL (ref <=0.20)
BASOPHIL %: 0 %
EOSINOPHIL #: <0.1 10*3/uL (ref <=0.50)
EOSINOPHIL %: 0 %
HCT: 41.8 % (ref 38.9–52.0)
HGB: 13.6 g/dL (ref 13.4–17.5)
IMMATURE GRANULOCYTE #: <0.1 10*3/uL (ref <0.10)
IMMATURE GRANULOCYTE %: 0 % (ref 0–1)
LYMPHOCYTE #: 1.2 10*3/uL (ref 1.00–4.80)
LYMPHOCYTE %: 8 %
MCH: 29.6 pg (ref 26.0–32.0)
MCHC: 32.5 g/dL (ref 31.0–35.5)
MCV: 91.1 fL (ref 78.0–100.0)
MONOCYTE #: 1.01 10*3/uL (ref 0.20–1.10)
MONOCYTE %: 6 %
MPV: 12 fL (ref 8.7–12.5)
NEUTROPHIL #: 13.56 10*3/uL — ABNORMAL HIGH (ref 1.50–7.70)
NEUTROPHIL %: 86 %
PLATELETS: 191 10*3/uL (ref 150–400)
RBC: 4.59 10*6/uL (ref 4.50–6.10)
RDW-CV: 12.5 % (ref 11.5–15.5)
WBC: 15.9 10*3/uL — ABNORMAL HIGH (ref 3.7–11.0)

## 2021-01-22 LAB — RESPIRATORY VIRUS PANEL
ADENOVIRUS ARRAY: NOT DETECTED
BORDETELLA PARAPERTUSSIS (IS 1001): NOT DETECTED
BORDETELLA PERTUSSIS ARRAY: NOT DETECTED
CHLAMYDOPHILA PNEUMONIAE ARRAY: NOT DETECTED
CORONAVIRUS 229E: NOT DETECTED
CORONAVIRUS HKU1: NOT DETECTED
CORONAVIRUS NL63: NOT DETECTED
CORONAVIRUS OC43: NOT DETECTED
INFLUENZA A: NOT DETECTED
INFLUENZA B ARRAY: NOT DETECTED
METAPNEUMOVIRUS ARRAY: NOT DETECTED
MYCOPLASMA PNEUMONIAE ARRAY: NOT DETECTED
PARAINFLUENZA 1 ARRAY: NOT DETECTED
PARAINFLUENZA 2 ARRAY: NOT DETECTED
PARAINFLUENZA 3 ARRAY: DETECTED — AB
PARAINFLUENZA 4 ARRAY: NOT DETECTED
RHINOVIRUS/ENTEROVIRUS ARRAY: NOT DETECTED
RSV ARRAY: NOT DETECTED
SARS CORONAVIRUS 2 (SARS-CoV-2): NOT DETECTED

## 2021-01-22 LAB — COMPREHENSIVE METABOLIC PANEL, NON-FASTING
ALBUMIN: 4 g/dL (ref 3.4–4.8)
ALKALINE PHOSPHATASE: 68 U/L (ref 45–115)
ALT (SGPT): 18 U/L (ref 10–55)
ANION GAP: 14 mmol/L — ABNORMAL HIGH (ref 4–13)
AST (SGOT): 24 U/L (ref 8–45)
BILIRUBIN TOTAL: 0.3 mg/dL (ref 0.3–1.3)
BUN/CREA RATIO: 20 (ref 6–22)
BUN: 28 mg/dL — ABNORMAL HIGH (ref 8–25)
CALCIUM: 10 mg/dL (ref 8.8–10.2)
CHLORIDE: 105 mmol/L (ref 96–111)
CO2 TOTAL: 23 mmol/L (ref 23–31)
CREATININE: 1.38 mg/dL — ABNORMAL HIGH (ref 0.75–1.35)
ESTIMATED GFR: 52 mL/min/BSA — ABNORMAL LOW (ref >=60)
GLUCOSE: 284 mg/dL — ABNORMAL HIGH (ref 65–125)
POTASSIUM: 4.1 mmol/L (ref 3.5–5.1)
PROTEIN TOTAL: 7.2 g/dL (ref 6.0–8.0)
SODIUM: 142 mmol/L (ref 136–145)

## 2021-01-22 LAB — TROPONIN-I: TROPONIN I: 17 ng/L (ref <=30)

## 2021-01-22 MED ORDER — SODIUM CHLORIDE 0.9 % IV BOLUS
1000.00 mL | INJECTION | Status: AC
Start: 2021-01-23 — End: 2021-01-23
  Administered 2021-01-22: 1000 mL via INTRAVENOUS
  Administered 2021-01-23: 0 mL via INTRAVENOUS

## 2021-01-22 NOTE — ED Nurses Note (Signed)
Rounded on patient.  Reviewed vital signs and treatment plan.  Asked if patient had any needs, especially in the area of toileting, pain management and general comfort.  Addressed issues.  Asked the patient/family if they had any needs before I left the room.  Indicated that I would be back within the hour to evaluate them again and update them on throughput progress.  Call bell within reach.

## 2021-01-22 NOTE — ED Provider Notes (Signed)
Larry Guardian, MD  [facility]  Emergency Department Visit Note    Date:  01/22/2021  Primary care provider:  Andrey Campanile, MD  Means of arrival:  ambulance  History obtained from: patient  History limited by: none    Chief Complaint:  Chest pain    HISTORY OF [name], date of birth [id number], is a 80 y.o. male who presents to the Emergency Department complaining of chest pain.  The patient has had 2 episodes of chest pain 1st which started at around 8:00 p.m. tonight and then the 2nd  at 9:00 p.m. tonight both at rest.  Each 1 lasted around 10 minutes.  The pain was located in the center of the chest with some radiation to the jaw and he described as a thumping type pain.  There is no radiation down the arms or into the back.  He denied any associated nausea, vomiting, shortness of breath, diaphoresis, dizziness, syncope, palpitations, and abdominal pain.  He has had a cough for the last 5 days with some rhinorrhea as well and was seen by his primary care doctor yesterday and prescribed prednisone and Zithromax.  He states that he was tested for flu and COVID and was negative.  Denies any leg swelling or pain, hematuria, hematochezia, diarrhea, and dysuria.  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    REVIEW OF SYSTEMS       The pertinent positive and negative symptoms are as per HPI. All other systems reviewed and are negative.     PATIENT HISTORY     Past Medical History:  Past Medical History:   Diagnosis Date    Coronary artery disease     High cholesterol     HTN (hypertension)     Wears glasses    MI    Past Surgical History:  Cardiac stents    Social History:  Social History     Tobacco Use    Smoking status: Every Day     Packs/day: 1.00     Types: Cigarettes    Smokeless tobacco: Never   Substance Use Topics    Alcohol use: No    Drug use: No     Social History     Substance and Sexual  Activity   Drug Use No       Medications:  Current Outpatient Medications   Medication Sig    amLODIPine (NORVASC) 5 mg Oral Tablet Take 1 Tablet (5 mg total) by mouth    azithromycin (ZITHROMAX) 250 mg Oral Tablet 2 Po on day 1 and 1 PO day 2-5    carvedilol (COREG) 12.5 mg Oral Tablet Take 12.5 mg by mouth Twice daily with food    clopidogrel (PLAVIX) 75 mg Oral Tablet Take 75 mg by mouth Once a day    lisinopril (PRINIVIL) 20 mg Oral Tablet Take 20 mg by mouth Once a day    predniSONE (DELTASONE) 20 mg Oral Tablet 3 PO daily for 3 days, 2 PO daily for 3 days, 1 PO daily for 3 days    simvastatin (ZOCOR) 40 mg Oral Tablet Take 40 mg by mouth Every evening       Allergies:  Allergies   Allergen Reactions    Aspirin Hives/ Urticaria       PHYSICAL EXAM     Vitals:  Filed Vitals:    01/22/21 2200  01/22/21 2315 01/23/21 0000   BP: (!) 145/72  137/64   Pulse: 94 74 66   Resp: (!) 24 14 17    SpO2: 98% 95% 96%       Pulse ox  98% on None (Room Air) interpreted by me as: Normal    Constitutional:  Elderly male in no acute distress  Head: Normocephalic and atraumatic.   ENT: Moist mucous membranes. No erythema or exudates in the oropharynx.  Eyes: EOM are normal. Pupils are equal, round, and reactive to light. No scleral icterus. Conjunctiva-pink  Neck: Neck supple. No meningismus  Cardiovascular: Normal rate and regular rhythm. Normal S1 and S2.  Exam reveals no gallop and no friction rub.  No murmur heard.  2+ distal pulses all 4 extremities.  Pulmonary/Chest: Effort normal and breath sounds normal.   Abdominal: Soft. No distension. There is no tenderness and no organomegaly. Normal bowel sounds present.   Back: There is no CVA tenderness.   Musculoskeletal: Normal range of motion. No edema and no tenderness. No clubbing or cyanosis.  Lymphadenopathy: No cervical adenopathy.   Neurological: Patient is alert and oriented to person, place, and time. . Normal speech   Skin: Skin is warm and dry. No rash noted.        DIAGNOSTIC STUDIES     Labs:    Results for orders placed or performed during the hospital encounter of 01/22/21   RESPIRATORY VIRUS PANEL    Specimen: Nasopharyngeal Swab   Result Value Ref Range    ADENOVIRUS ARRAY Not Detected Not Detected    CORONAVIRUS 229E Not Detected Not Detected    CORONAVIRUS HKU1 Not Detected Not Detected    CORONAVIRUS NL63 Not Detected Not Detected    CORONAVIRUS OC43 Not Detected Not Detected    SARS CORONAVIRUS 2 (SARS-CoV-2) Not Detected Not Detected    METAPNEUMOVIRUS ARRAY Not Detected Not Detected    RHINOVIRUS/ENTEROVIRUS ARRAY Not Detected Not Detected    INFLUENZA A Not Detected Not Detected    INFLUENZA B ARRAY Not Detected Not Detected    PARAINFLUENZA 1 ARRAY Not Detected Not Detected    PARAINFLUENZA 2 ARRAY Not Detected Not Detected    PARAINFLUENZA 3 ARRAY Detected (A) Not Detected    PARAINFLUENZA 4 ARRAY Not Detected Not Detected    RSV ARRAY Not Detected Not Detected    BORDETELLA PARAPERTUSSIS (IS 1001) Not Detected Not Detected    BORDETELLA PERTUSSIS ARRAY Not Detected Not Detected    CHLAMYDOPHILA PNEUMONIAE ARRAY Not Detected Not Detected    MYCOPLASMA PNEUMONIAE ARRAY Not Detected Not Detected   COMPREHENSIVE METABOLIC PANEL, NON-FASTING   Result Value Ref Range    SODIUM 142 136 - 145 mmol/L    POTASSIUM 4.1 3.5 - 5.1 mmol/L    CHLORIDE 105 96 - 111 mmol/L    CO2 TOTAL 23 23 - 31 mmol/L    ANION GAP 14 (H) 4 - 13 mmol/L    BUN 28 (H) 8 - 25 mg/dL    CREATININE 1.38 (H) 0.75 - 1.35 mg/dL    BUN/CREA RATIO 20 6 - 22    ESTIMATED GFR 52 (L) >=60 mL/min/BSA    ALBUMIN 4.0 3.4 - 4.8 g/dL     CALCIUM 10.0 8.8 - 10.2 mg/dL    GLUCOSE 284 (H) 65 - 125 mg/dL    ALKALINE PHOSPHATASE 68 45 - 115 U/L    ALT (SGPT) 18 10 - 55 U/L    AST (SGOT)  24 8 - 45 U/L  BILIRUBIN TOTAL 0.3 0.3 - 1.3 mg/dL    PROTEIN TOTAL 7.2 6.0 - 8.0 g/dL   TROPONIN-I   Result Value Ref Range    TROPONIN I 17 <=30 ng/L   CBC WITH DIFF   Result Value Ref Range    WBC 15.9 (H) 3.7 - 11.0  x103/uL    RBC 4.59 4.50 - 6.10 x106/uL    HGB 13.6 13.4 - 17.5 g/dL    HCT 67.5 91.6 - 38.4 %    MCV 91.1 78.0 - 100.0 fL    MCH 29.6 26.0 - 32.0 pg    MCHC 32.5 31.0 - 35.5 g/dL    RDW-CV 66.5 99.3 - 57.0 %    PLATELETS 191 150 - 400 x103/uL    MPV 12.0 8.7 - 12.5 fL    NEUTROPHIL % 86 %    LYMPHOCYTE % 8 %    MONOCYTE % 6 %    EOSINOPHIL % 0 %    BASOPHIL % 0 %    NEUTROPHIL # 13.56 (H) 1.50 - 7.70 x103/uL    LYMPHOCYTE # 1.20 1.00 - 4.80 x103/uL    MONOCYTE # 1.01 0.20 - 1.10 x103/uL    EOSINOPHIL # <0.10 <=0.50 x103/uL    BASOPHIL # <0.10 <=0.20 x103/uL    IMMATURE GRANULOCYTE % 0 0 - 1 %    IMMATURE GRANULOCYTE # <0.10 <0.10 x103/uL   LIPID PANEL   Result Value Ref Range    CHOLESTEROL  174 100 - 200 mg/dL    HDL CHOL 48 (L) >=17 mg/dL    TRIGLYCERIDES 88 <793 mg/dL    LDL CALC 903 (H) <009 mg/dL    VLDL CALC 18 <23 mg/dL    NON-HDL 300 <=762 mg/dL    CHOL/HDL RATIO 3.6    B-TYPE NATRIURETIC PEPTIDE   Result Value Ref Range    BNP 140 (H) <=99 pg/mL   THYROID STIMULATING HORMONE WITH FREE T4 REFLEX   Result Value Ref Range    TSH 0.513 0.430 - 3.550 uIU/mL     Labs reviewed and interpreted by me.    Radiology:    XR AP MOBILE CHEST   Final Result   No acute cardiopulmonary process.         Radiologist location ID: [id number]             XR AP MOBILE CHEST  Radiological imaging interpreted by radiologist and independently reviewed by me.    EKG:  12 lead EKG interpreted by me shows normal sinus rhythm, rate of 85, normal axis, normal intervals, normal ST/T-waves.    Cardiac Monitor:    Normal sinus rhythm, rate in the 80s, no ectopy (interpreted by me).       ED PROGRESS NOTE / MEDICAL DECISION MAKING     Old records reviewed by me:  I reviewed the problem list and nursing notes.    Orders Placed This Encounter    RESPIRATORY VIRUS PANEL    XR AP MOBILE CHEST    CBC/DIFF    COMPREHENSIVE METABOLIC PANEL, NON-FASTING    TROPONIN-I    CBC WITH DIFF    LIPID PANEL    B-TYPE NATRIURETIC PEPTIDE     HGA1C (HEMOGLOBIN A1C WITH EST AVG GLUCOSE)    THYROID STIMULATING HORMONE WITH FREE T4 REFLEX    TROPONIN-I    TROPONIN-I    CBC/DIFF    BASIC METABOLIC PANEL    PTT (PARTIAL THROMBOPLASTIN TIME)    ECG 12-LEAD  POCT FINGERSTICK GLUCOSE    TRANSTHORACIC ECHOCARDIOGRAM - ADULT    CANCELED: INSERT & MAINTAIN PERIPHERAL IV ACCESS    INSERT & MAINTAIN PERIPHERAL IV ACCESS    PERIPHERAL IV DRESSING CHANGE    PATIENT CLASS/LEVEL OF CARE DESIGNATION - [facility]    NS bolus infusion 1,000 mL    lisinopril (PRINIVIL) tablet    carvedilol (COREG) tablet    clopidogrel (PLAVIX) 75 mg tablet    amLODIPine (NORVASC) tablet    azithromycin (ZITHROMAX) tablet    predniSONE (DELTASONE) tablet    atorvastatin (LIPITOR) tablet    ipratropium-albuterol 0.5 mg-3 mg(2.5 mg base)/3 mL Solution for Nebulization    AND Linked Order Group     SSIP insulin lispro (HUMALOG) 100 units/mL SubQ pen     dextrose 50% (0.5 g/mL) injection - syringe    acetaminophen (TYLENOL) tablet    nitroGLYCERIN (NITROSTAT) sublingual tablet    morphine 4 mg/mL injection    NS flush syringe    NS flush syringe    NS 250 mL flush bag    nicotine (NICODERM CQ) transdermal patch (mg/24 hr)    heparin 5,000 unit/mL injection    prochlorperazine (COMPAZINE) 5 mg/mL injection     ED Course as of 01/23/21 0117   Thu Jan 22, 2021   2319 On recheck, the patient remains asymptomatic at this time.  I updated him on his test results recommended admission to the hospital for further evaluation for the episodes of chest pain given his underlying history of coronary artery disease with multiple previous stents and MIs.  He also has evidence of acute renal insufficiency and I ordered IV fluids for that.  White blood cell count and glucose are elevated likely due to the patient taking prednisone.  I discussed the case the hospitalist, Dr. Wilnette Kales, who will admit.  Patient is in agreement with this plan all questions have been answered to the patient  and his family satisfaction         Pre-Disposition Vitals:  Filed Vitals:    01/22/21 2200 01/22/21 2315 01/23/21 0000   BP: (!) 145/72  137/64   Pulse: 94 74 66   Resp: (!) 24 14 17    SpO2: 98% 95% 96%           CLINICAL IMPRESSION     Encounter Diagnoses   Name Primary?    Acute chest pain Yes    Acute renal insufficiency     Chest pain     CAD (coronary artery disease)          DISPOSITION/PLAN     Admitted          Condition at Disposition: Fair

## 2021-01-22 NOTE — ED Triage Notes (Signed)
Patient arrived via ems from home- complaining of sudden onset of midsternal chest pain that radiated into his jaw. Upon ems arrival patient pain free. Pt was seen yesterday at PMD office for cough and congestion- was started on steroids and some throat med. Hx of MI x2 with 2 stents

## 2021-01-22 NOTE — H&P (Signed)
[facility]  [place], [place] [address]    General History and Physical    [name], [place]  Date of Admission:  01/22/2021   Date of Service:  01/22/2021   Date of Birth:  27-Dec-1940    PCP: Andrey Campanile, MD  Chief Complaint:  Chest pain     HPI: [name] is a 80 y.o., White male with a history of HTN, HLD, and CAD who presented to the ED compalining of substernal chest pain that radiated to his jaw ths evening. Pt reports 2 episodes of chest pain, first around 8 tonight and then again about an hour after, both at rest and lasting around 10 minutes. he denied any associated nausea, vomiting, diaphoresis, shortness of breath, dizziness, palpitations, extremity edema, or abdominal pain. He has had a dry nonproductive cough for the last 5 days with some nasal congestion- was seen by his primary care doctor yesterday and prescribed prednisone and Zithromax. 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 He denies any other episodes of chest pain or cardiac issues since that time.   Pt initial troponin today was negative. Repeat a few hours later incerased to 200's. EKG does not show any clear ischemic changes when compared to previous EKGs. He swabbed positive for parainfluenza virus today. CXR was acutely unremarkable. He has been chest pain free since second episode of chest pain. Vss. He was admitted to the hospital for further treatment and monitoring       Patient Active Problem List    Diagnosis Date Noted    Chest pain 01/22/2021       Past Medical History:   Diagnosis Date    Coronary artery disease     High cholesterol     HTN (hypertension)     Wears glasses            Past Surgical History:   Procedure Laterality Date    HX CORONARY ARTERY BYPASS GRAFT              Medications Prior to Admission     Prescriptions    carvedilol (COREG) 12.5 mg Oral Tablet    Take 12.5 mg by mouth Twice daily with food    clopidogrel  (PLAVIX) 75 mg Oral Tablet    Take 75 mg by mouth Once a day    lisinopril (PRINIVIL) 20 mg Oral Tablet    Take 20 mg by mouth Once a day    simvastatin (ZOCOR) 40 mg Oral Tablet    Take 40 mg by mouth Every evening        No current facility-administered medications for this encounter.      Allergies   Allergen Reactions    Aspirin Hives/ Urticaria       Social History     Tobacco Use    Smoking status: Every Day     Packs/day: 1.00     Types: Cigarettes    Smokeless tobacco: Never   Substance Use Topics    Alcohol use: No     deneis family hx of cardiac disease  Family Medical History:    None           ROS: Other than ROS in the HPI, all other systems were negative.    EXAM:  Heart Rate: 74  BP (Non-Invasive): (!) 145/72  Respiratory Rate: 14  SpO2: 95 %  General: no acute distress, resting  comfortably    Eyes: Pupils equal and round, reactive to light. Anicteric  HEENT: Head atraumatic and normocephalic, MMM  Neck: No JVD or thyromegaly or lymphadenopathy   Lungs: No respiratory distress. Lungs CTAB. No w/r/r.   Cardiovascular: normal rate, regular rhythm, S1, S2 normal, no murmur appreciated  Abdomen: Soft, non-tender, non-distended, bowel sounds normal  Extremities: extremities normal, atraumatic, no cyanosis or edema. DP pulses 2+ and equal bilaterally.   Skin: Skin warm and dry   Neurologic: Alert, oriented, no focal deficit, CN II-XII grossly intact  Lymphatics: No lymphadenopathy   Psychiatric: Normal affect, behavior       Labs:    I have reviewed all lab results.  Lab Results for Last 24 Hours:    Results for orders placed or performed during the hospital encounter of 01/22/21 (from the past 24 hour(s))   COMPREHENSIVE METABOLIC PANEL, NON-FASTING   Result Value Ref Range    SODIUM 142 136 - 145 mmol/L    POTASSIUM 4.1 3.5 - 5.1 mmol/L    CHLORIDE 105 96 - 111 mmol/L    CO2 TOTAL 23 23 - 31 mmol/L    ANION GAP 14 (H) 4 - 13 mmol/L    BUN 28 (H) 8 - 25 mg/dL    CREATININE 1.38 (H) 0.75 - 1.35 mg/dL     BUN/CREA RATIO 20 6 - 22    ESTIMATED GFR 52 (L) >=60 mL/min/BSA    ALBUMIN 4.0 3.4 - 4.8 g/dL     CALCIUM 10.0 8.8 - 10.2 mg/dL    GLUCOSE 284 (H) 65 - 125 mg/dL    ALKALINE PHOSPHATASE 68 45 - 115 U/L    ALT (SGPT) 18 10 - 55 U/L    AST (SGOT)  24 8 - 45 U/L    BILIRUBIN TOTAL 0.3 0.3 - 1.3 mg/dL    PROTEIN TOTAL 7.2 6.0 - 8.0 g/dL   TROPONIN-I   Result Value Ref Range    TROPONIN I 17 <=30 ng/L   RESPIRATORY VIRUS PANEL    Specimen: Nasopharyngeal Swab   Result Value Ref Range    ADENOVIRUS ARRAY Not Detected Not Detected    CORONAVIRUS 229E Not Detected Not Detected    CORONAVIRUS HKU1 Not Detected Not Detected    CORONAVIRUS NL63 Not Detected Not Detected    CORONAVIRUS OC43 Not Detected Not Detected    SARS CORONAVIRUS 2 (SARS-CoV-2) Not Detected Not Detected    METAPNEUMOVIRUS ARRAY Not Detected Not Detected    RHINOVIRUS/ENTEROVIRUS ARRAY Not Detected Not Detected    INFLUENZA A Not Detected Not Detected    INFLUENZA B ARRAY Not Detected Not Detected    PARAINFLUENZA 1 ARRAY Not Detected Not Detected    PARAINFLUENZA 2 ARRAY Not Detected Not Detected    PARAINFLUENZA 3 ARRAY Detected (A) Not Detected    PARAINFLUENZA 4 ARRAY Not Detected Not Detected    RSV ARRAY Not Detected Not Detected    BORDETELLA PARAPERTUSSIS (IS 1001) Not Detected Not Detected    BORDETELLA PERTUSSIS ARRAY Not Detected Not Detected    CHLAMYDOPHILA PNEUMONIAE ARRAY Not Detected Not Detected    MYCOPLASMA PNEUMONIAE ARRAY Not Detected Not Detected   CBC WITH DIFF   Result Value Ref Range    WBC 15.9 (H) 3.7 - 11.0 x103/uL    RBC 4.59 4.50 - 6.10 x106/uL    HGB 13.6 13.4 - 17.5 g/dL    HCT 41.8 38.9 - 52.0 %    MCV 91.1 78.0 - 100.0 fL  MCH 29.6 26.0 - 32.0 pg    MCHC 32.5 31.0 - 35.5 g/dL    RDW-CV 40.3 47.4 - 25.9 %    PLATELETS 191 150 - 400 x103/uL    MPV 12.0 8.7 - 12.5 fL    NEUTROPHIL % 86 %    LYMPHOCYTE % 8 %    MONOCYTE % 6 %    EOSINOPHIL % 0 %    BASOPHIL % 0 %    NEUTROPHIL # 13.56 (H) 1.50 - 7.70 x103/uL     LYMPHOCYTE # 1.20 1.00 - 4.80 x103/uL    MONOCYTE # 1.01 0.20 - 1.10 x103/uL    EOSINOPHIL # <0.10 <=0.50 x103/uL    BASOPHIL # <0.10 <=0.20 x103/uL    IMMATURE GRANULOCYTE % 0 0 - 1 %    IMMATURE GRANULOCYTE # <0.10 <0.10 x103/uL       Imaging Studies:        XR AP MOBILE CHEST performed on 01/22/2021 10:50 PM.    REASON FOR EXAM:  chest pain, cough x 5 days    TECHNIQUE: 1 views/1 images submitted for interpretation.    COMPARISON:  05/14/2017.    FINDINGS: The cardiac silhouette is within normal limits of size. There are stable chronic interstitial changes. No focal consolidation, pleural effusion, or pneumothorax. Imaged osseous structures are without acute findings.    IMPRESSION:  No acute cardiopulmonary process.    Assessment/Plan:   [name] is a 80 y.o., White male who presents with the following:    1. NSTEMI  2. Hx of CAD, s/p stenting  --pt has been chest pain free, prn morphine and nitro ordered  --no acute ischemic changes noted on initial or repeat EKG    --but troponins trending up, continue to monitor   --start heparin gtt  --cardiology consulted, npo pending their evaluation  --updated echo ordered   --reports sensitivity to aspirin so is on Plavix  --already on ACE1 and BB  --check updated lipid panel, pending results will increase atorvastatin to 40 mg nighty  --Usually followed by [facility] so cannot view past records but he says he usually sees them yearly    3. Parainfluenza virus  --no evidence of superimposed pneumonia and not wheezing on my exam, stable on RA, but can complete course of azithromycin and prednisone prescribed as oupt  --prn nebs  --symptom onset 5 days ago, afebrile so should not be contagious anymore    4. AKI  --mild, received a lt of fluid. Hold lisinopril and repeat chem panel in am     5. Controlled HTN  --stable, Hold lisinopril until renal function improves. Continue coreg and amlodipine     6. hyperglycemia  --denies prior hx of DM  --BG  284 today. Has been on steroids but looks like sugars have been high in the past  --Check a1c  --Start Humalog ss achs for now    7. tobacco abuse  --pt counseled on smoking cessation, nicoderm patch offered and ordered     DVT PPx: heparin gtt  Code Status:  Full Code    Eula Fried, DNP,FNP-C        Portions of this note may be dictated using voice recognition software or a dictation service. Variances in spelling and vocabulary are possible and unintentional. Not all errors are caught/corrected. Please notify the Thereasa Parkin if any discrepancies are noted or if the meaning of any statement is not clear.

## 2021-01-23 ENCOUNTER — Observation Stay (HOSPITAL_BASED_OUTPATIENT_CLINIC_OR_DEPARTMENT_OTHER): Payer: Commercial Managed Care - PPO

## 2021-01-23 DIAGNOSIS — I2510 Atherosclerotic heart disease of native coronary artery without angina pectoris: Secondary | ICD-10-CM

## 2021-01-23 DIAGNOSIS — R079 Chest pain, unspecified: Secondary | ICD-10-CM

## 2021-01-23 DIAGNOSIS — I499 Cardiac arrhythmia, unspecified: Secondary | ICD-10-CM

## 2021-01-23 LAB — LIPID PANEL
CHOL/HDL RATIO: 3.6
CHOLESTEROL: 174 mg/dL (ref 100–200)
HDL CHOL: 48 mg/dL — ABNORMAL LOW (ref >=50)
LDL CALC: 108 mg/dL — ABNORMAL HIGH (ref <100)
NON-HDL: 126 mg/dL (ref <=190)
TRIGLYCERIDES: 88 mg/dL (ref <150)
VLDL CALC: 18 mg/dL (ref <30)

## 2021-01-23 LAB — ECG 12-LEAD
Atrial Rate: 85 {beats}/min
Atrial Rate: 67 {beats}/min
Calculated P Axis: -14 degrees
Calculated P Axis: 39 degrees
Calculated R Axis: 48 degrees
Calculated T Axis: 19 degrees
Calculated T Axis: 35 degrees
PR Interval: 168 ms
PR Interval: 130 ms
QRS Duration: 116 ms
QRS Duration: 108 ms
QT Interval: 366 ms
QT Interval: 418 ms
QTC Calculation: 435 ms
QTC Calculation: 441 ms
Ventricular rate: 85 {beats}/min
Ventricular rate: 67 {beats}/min

## 2021-01-23 LAB — PTT (PARTIAL THROMBOPLASTIN TIME)
APTT: 27.6 seconds (ref 24.0–36.5)
APTT: 188.7 seconds (ref 24.0–36.5)
APTT: 50.5 seconds — ABNORMAL HIGH (ref 24.0–36.5)
APTT: 48.1 seconds — ABNORMAL HIGH (ref 24.0–36.5)

## 2021-01-23 LAB — TROPONIN-I
TROPONIN I: 241 ng/L (ref <=30)
TROPONIN I: 389 ng/L (ref <=30)
TROPONIN I: 665 ng/L (ref <=30)
TROPONIN I: 397 ng/L (ref <=30)

## 2021-01-23 LAB — THYROID STIMULATING HORMONE WITH FREE T4 REFLEX: TSH: 0.513 u[IU]/mL (ref 0.430–3.550)

## 2021-01-23 LAB — POC FINGERSTICK GLUCOSE - BMC/JMC (RESULTS)
GLUCOSE, POC: 92 mg/dl (ref 60–100)
GLUCOSE, POC: 102 mg/dl — ABNORMAL HIGH (ref 60–100)
GLUCOSE, POC: 112 mg/dl — ABNORMAL HIGH (ref 60–100)
GLUCOSE, POC: 120 mg/dl — ABNORMAL HIGH (ref 60–100)

## 2021-01-23 LAB — B-TYPE NATRIURETIC PEPTIDE: BNP: 140 pg/mL — ABNORMAL HIGH (ref <=99)

## 2021-01-23 LAB — HGA1C (HEMOGLOBIN A1C WITH EST AVG GLUCOSE)
ESTIMATED AVERAGE GLUCOSE: 123 mg/dL
HEMOGLOBIN A1C: 5.9 % — ABNORMAL HIGH (ref 4.0–5.6)

## 2021-01-23 LAB — MAGNESIUM: MAGNESIUM: 1.7 mg/dL — ABNORMAL LOW (ref 1.8–2.6)

## 2021-01-23 MED ORDER — ATORVASTATIN 40 MG TABLET
40.0000 mg | ORAL_TABLET | Freq: Every evening | ORAL | Status: DC
Start: 2021-01-23 — End: 2021-01-25
  Administered 2021-01-23: 22:00:00 40 mg via ORAL
  Administered 2021-01-24: 21:00:00 0 mg via ORAL
  Filled 2021-01-23 (×2): qty 1

## 2021-01-23 MED ORDER — HEPARIN (PORCINE) 5,000 UNITS/ML BOLUS
60.0000 [IU]/kg | INTRAMUSCULAR | Status: AC
Start: 2021-01-23 — End: 2021-01-23
  Administered 2021-01-23: 03:00:00 4000 [IU] via INTRAVENOUS
  Filled 2021-01-23: qty 1

## 2021-01-23 MED ORDER — AZITHROMYCIN 250 MG TABLET
250.0000 mg | ORAL_TABLET | Freq: Every day | ORAL | Status: AC
Start: 2021-01-23 — End: 2021-01-24
  Administered 2021-01-23 – 2021-01-24 (×2): 250 mg via ORAL
  Filled 2021-01-23 (×2): qty 1

## 2021-01-23 MED ORDER — SODIUM CHLORIDE 0.9 % IV BOLUS
500.0000 mL | INJECTION | Freq: Once | Status: AC
Start: 2021-01-23 — End: 2021-01-23
  Administered 2021-01-23: 16:00:00 500 mL via INTRAVENOUS
  Administered 2021-01-23: 0 mL via INTRAVENOUS

## 2021-01-23 MED ORDER — IPRATROPIUM 0.5 MG-ALBUTEROL 3 MG (2.5 MG BASE)/3 ML NEBULIZATION SOLN
3.0000 mL | INHALATION_SOLUTION | RESPIRATORY_TRACT | Status: DC | PRN
Start: 2021-01-23 — End: 2021-01-27
  Administered 2021-01-24: 09:00:00 3 mL via RESPIRATORY_TRACT
  Filled 2021-01-23: qty 3

## 2021-01-23 MED ORDER — ACETAMINOPHEN 500 MG TABLET
1000.0000 mg | ORAL_TABLET | Freq: Four times a day (QID) | ORAL | Status: DC | PRN
Start: 2021-01-23 — End: 2021-01-27
  Administered 2021-01-24: 17:00:00 1000 mg via ORAL
  Filled 2021-01-23 (×2): qty 2

## 2021-01-23 MED ORDER — SODIUM CHLORIDE 0.9% FLUSH BAG - 250 ML
INTRAVENOUS | Status: DC | PRN
Start: 2021-01-23 — End: 2021-01-27

## 2021-01-23 MED ORDER — NITROGLYCERIN 0.4 MG SUBLINGUAL TABLET
0.4000 mg | SUBLINGUAL_TABLET | SUBLINGUAL | Status: DC | PRN
Start: 2021-01-23 — End: 2021-01-27

## 2021-01-23 MED ORDER — DEXTROSE 50 % IN WATER (D50W) INTRAVENOUS SYRINGE
12.5000 g | INJECTION | INTRAVENOUS | Status: DC | PRN
Start: 2021-01-23 — End: 2021-01-27

## 2021-01-23 MED ORDER — SODIUM CHLORIDE 0.9 % (FLUSH) INJECTION SYRINGE
10.0000 mL | INJECTION | INTRAMUSCULAR | Status: DC | PRN
Start: 2021-01-23 — End: 2021-01-27

## 2021-01-23 MED ORDER — SODIUM CHLORIDE 0.9 % (FLUSH) INJECTION SYRINGE
10.0000 mL | INJECTION | Freq: Three times a day (TID) | INTRAMUSCULAR | Status: DC
Start: 2021-01-23 — End: 2021-01-27
  Administered 2021-01-23: 14:00:00 0 mL via INTRAVENOUS
  Administered 2021-01-23: 06:00:00 10 mL via INTRAVENOUS
  Administered 2021-01-23: 22:00:00 0 mL via INTRAVENOUS
  Administered 2021-01-24: 06:00:00 10 mL via INTRAVENOUS
  Administered 2021-01-24 – 2021-01-25 (×2): 0 mL via INTRAVENOUS
  Administered 2021-01-25: 14:00:00 10 mL via INTRAVENOUS
  Administered 2021-01-25: 06:00:00 0 mL via INTRAVENOUS
  Administered 2021-01-26: 22:00:00 10 mL via INTRAVENOUS
  Administered 2021-01-26: 06:00:00 0 mL via INTRAVENOUS
  Administered 2021-01-27: 06:00:00 10 mL via INTRAVENOUS
  Administered 2021-01-27: 14:00:00 0 mL via INTRAVENOUS

## 2021-01-23 MED ORDER — PREDNISONE 20 MG TABLET
20.0000 mg | ORAL_TABLET | Freq: Two times a day (BID) | ORAL | Status: AC
Start: 2021-01-23 — End: 2021-01-25
  Administered 2021-01-23 – 2021-01-25 (×6): 20 mg via ORAL
  Filled 2021-01-23 (×6): qty 1

## 2021-01-23 MED ORDER — MORPHINE 4 MG/ML INTRAVENOUS SOLUTION
4.0000 mg | INTRAVENOUS | Status: DC | PRN
Start: 2021-01-23 — End: 2021-01-27

## 2021-01-23 MED ORDER — CLOPIDOGREL 75 MG TABLET
75.0000 mg | ORAL_TABLET | Freq: Every day | ORAL | Status: DC
Start: 2021-01-23 — End: 2021-01-26
  Administered 2021-01-23 – 2021-01-26 (×4): 75 mg via ORAL
  Filled 2021-01-23 (×3): qty 1

## 2021-01-23 MED ORDER — CARVEDILOL 3.125 MG TABLET
12.50 mg | ORAL_TABLET | Freq: Two times a day (BID) | ORAL | Status: DC
Start: 2021-01-23 — End: 2021-01-24
  Administered 2021-01-23 – 2021-01-24 (×3): 12.5 mg via ORAL
  Filled 2021-01-23 (×3): qty 4

## 2021-01-23 MED ORDER — LISINOPRIL 10 MG TABLET
20.0000 mg | ORAL_TABLET | Freq: Every day | ORAL | Status: DC
Start: 2021-01-23 — End: 2021-01-27
  Administered 2021-01-25 – 2021-01-27 (×3): 20 mg via ORAL
  Filled 2021-01-23 (×4): qty 2

## 2021-01-23 MED ORDER — NICOTINE 14 MG/24 HR DAILY TRANSDERMAL PATCH
14.0000 mg | MEDICATED_PATCH | Freq: Every day | TRANSDERMAL | Status: DC
Start: 2021-01-23 — End: 2021-01-24
  Administered 2021-01-23 – 2021-01-24 (×2): 0 mg via TRANSDERMAL
  Filled 2021-01-23 (×2): qty 1

## 2021-01-23 MED ORDER — INSULIN LISPRO 100 UNIT/ML INJECTION SSIP - CITY
1.0000 [IU] | Freq: Four times a day (QID) | SUBCUTANEOUS | Status: DC
Start: 2021-01-23 — End: 2021-01-27
  Administered 2021-01-23 – 2021-01-24 (×6): 0 [IU] via SUBCUTANEOUS
  Administered 2021-01-24 (×2): 1 [IU] via SUBCUTANEOUS
  Administered 2021-01-24 – 2021-01-27 (×11): 0 [IU] via SUBCUTANEOUS
  Filled 2021-01-23: qty 300

## 2021-01-23 MED ORDER — AMLODIPINE 5 MG TABLET
5.0000 mg | ORAL_TABLET | Freq: Every day | ORAL | Status: DC
Start: 2021-01-23 — End: 2021-01-27
  Administered 2021-01-23 – 2021-01-27 (×5): 5 mg via ORAL
  Filled 2021-01-23 (×5): qty 1

## 2021-01-23 MED ORDER — PROCHLORPERAZINE EDISYLATE 10 MG/2 ML (5 MG/ML) INJECTION SOLUTION
10.0000 mg | Freq: Four times a day (QID) | INTRAMUSCULAR | Status: DC | PRN
Start: 2021-01-23 — End: 2021-01-27

## 2021-01-23 MED ORDER — HEPARIN (PORCINE) 25,000 UNIT/250 ML IN 0.45 % SODIUM CHLORIDE IV SOLN
12.0000 [IU]/kg/h | INTRAVENOUS | Status: DC
Start: 2021-01-23 — End: 2021-01-26
  Administered 2021-01-23: 03:00:00 12 [IU]/kg/h via INTRAVENOUS
  Administered 2021-01-23: 11:00:00 9 [IU]/kg/h via INTRAVENOUS
  Administered 2021-01-23: 10:00:00 0 [IU]/kg/h via INTRAVENOUS
  Administered 2021-01-24 (×2): 11 [IU]/kg/h via INTRAVENOUS
  Administered 2021-01-24: 17:00:00 13 [IU]/kg/h via INTRAVENOUS
  Administered 2021-01-25: 17:00:00 14 [IU]/kg/h via INTRAVENOUS
  Administered 2021-01-26: 15:00:00 0 [IU]/kg/h via INTRAVENOUS
  Administered 2021-01-26: 08:00:00 12 [IU]/kg/h via INTRAVENOUS
  Administered 2021-01-26: 07:00:00 0 [IU]/kg/h via INTRAVENOUS
  Filled 2021-01-23 (×3): qty 250

## 2021-01-23 MED ORDER — HEPARIN (PORCINE) 5,000 UNIT/ML INJECTION SOLUTION
5000.0000 [IU] | Freq: Three times a day (TID) | INTRAMUSCULAR | Status: DC
Start: 2021-01-23 — End: 2021-01-23

## 2021-01-23 NOTE — ED Nurses Note (Signed)
Patient alert and awake. Oriented x4- speech clear- resp. Equal and nonlabored, nadn at this time. SR on cardiac monitor- cp free. Skin warm and dry. No acute changes- continue to monitor patient

## 2021-01-23 NOTE — Care Management Notes (Signed)
Patient sleeping. Attempted to call wife regarding discharge planning but no answer at phone number listed. Case Management will continue to follow for discharge planning needs.

## 2021-01-23 NOTE — Progress Notes (Signed)
Pt withhx of HTN,HLD, CAD s/p PCI in 2009 and 2011 who is admitted for NSTEMI.     Pt was seen and examed at bedside, he is on heparin gtt, he is now pain free.  On room air.     Exam with well maintained mental status.   Lung clear  Heart RRR no murmur.   No peripheral edema.     ECHO report reviewed with 50-55% LVEF and no acute wall motion abnormality. No significant valvular or other structural abnormality.     Cardiology consulted, recommended LHC,      Aware Scr 1.3, very mild AKI with normal baseline, hold lisinopril, will give 500 cc IV bolus.    Estevan Oaks, MD

## 2021-01-23 NOTE — ED Nurses Note (Signed)
Rounded on patient.  Reviewed vital signs and treatment plan.  Asked if patient had any needs, especially in the area of toileting, pain management and general comfort.  Addressed issues.  Asked the patient/family if they had any needs before I left the room.  Indicated that I would be back within the hour to evaluate them again and update them on throughput progress.  Call bell within reach.

## 2021-01-23 NOTE — ED Nurses Note (Signed)
Patient sleeping- resp. Equal and nonlabored, nadn at this time. Remains on cardiac monitor- SR- CP free. Skin warm and dry. No acute changes- continue to monitor patient

## 2021-01-23 NOTE — ED Nurses Note (Signed)
Place call to cath lab, per cath lab patient will not be on the schedule today and he is ok to eat.

## 2021-01-23 NOTE — ED Nurses Note (Signed)
Pt reports coreg dose change, 6.25 BID instead of 12.5. Dose of 6.25 administered this AM, provider made aware.

## 2021-01-23 NOTE — ED Nurses Note (Signed)
PT's Heparin drip re-started at 11:00 am.

## 2021-01-23 NOTE — Consults (Signed)
[facility]  Consultation Note    Date Time: 01/23/2021 08:34  Patient Name: [name]  MRN#: [id number]  DOB: [birth year]  Consulting Physician: Estevan Oaks, MD  Length of stay: 0 days    Reason for Consult/Chief complaint:   The patient was seen at the request of Estevan Oaks, MD for the evaluation of NSTEMI    HPI:   [name] is a 80 y.o. male with PMH of Essential Hypertension, Hyperlipidemia, CAD s/p multiple PCI to mid RCA in 2009 and 2011, presents for evaluation of chest pain.   Reports having mid-sternal chest pain while watching TV radiating to his jaw, lasting 10 mintues.   Subsequently resolved but returned thus he came to the ER    In the ER, he was chest pain free  EKG shows NSR, no acute ST elevations or depressions  Troponin is 17 and increased to 389    At present, chest pain free    Cardiology consulted for NSTEMI    PMH: Essential Hypertension, Hyperlipidemia, CAD s/p multiple PCI to mid RCA in 2009 and 2011  PSH: PCI   SH: Former smoker, quit 5-6 years ago, No Alcohol and No IVDA  FH: Negative for premature CAD and sudden death    ROS: Negative for premature CAD     Past Medical History:     Past Medical History:   Diagnosis Date   . Coronary artery disease    . High cholesterol    . HTN (hypertension)    . Wears glasses          Past Surgical History:     Past Surgical History:   Procedure Laterality Date   . HX CORONARY ARTERY BYPASS GRAFT           Allergies:     Allergies   Allergen Reactions   . Aspirin Hives/ Urticaria     Outpatient Medications:     Prior to Admission Medications   Prescriptions Last Dose Informant Patient Reported? Taking?   amLODIPine (NORVASC) 5 mg Oral Tablet   Yes Yes   Sig: Take 1 Tablet (5 mg total) by mouth   azithromycin (ZITHROMAX) 250 mg Oral Tablet   Yes Yes   Sig: 2 Po on day 1 and 1 PO day 2-5   carvedilol (COREG) 12.5 mg Oral Tablet   Yes No   Sig: Take 12.5 mg by mouth Twice daily with food   clopidogrel (PLAVIX) 75 mg Oral Tablet   Yes  No   Sig: Take 75 mg by mouth Once a day   lisinopril (PRINIVIL) 20 mg Oral Tablet   Yes No   Sig: Take 20 mg by mouth Once a day   predniSONE (DELTASONE) 20 mg Oral Tablet   Yes Yes   Sig: 3 PO daily for 3 days, 2 PO daily for 3 days, 1 PO daily for 3 days   simvastatin (ZOCOR) 40 mg Oral Tablet   Yes No   Sig: Take 40 mg by mouth Every evening      Facility-Administered Medications: None       Inpatient Medications:     acetaminophen (TYLENOL) tablet, 1,000 mg, Oral, Q6H PRN  amLODIPine (NORVASC) tablet, 5 mg, Oral, Daily  atorvastatin (LIPITOR) tablet, 40 mg, Oral, QPM  azithromycin (ZITHROMAX) tablet, 250 mg, Oral, Daily  carvedilol (COREG) tablet, 12.5 mg, Oral, 2x/day-Food  clopidogrel (PLAVIX) 75 mg tablet, 75 mg, Oral, Daily  SSIP insulin lispro (HUMALOG) 100 units/mL  SubQ pen, 1-5 Units, Subcutaneous, 4x/day AC   And  dextrose 50% (0.5 g/mL) injection - syringe, 12.5 g, Intravenous, Q15 Min PRN  heparin 25,000 units in 0.45% NS 250 mL infusion, 12 Units/kg/hr (Adjusted), Intravenous, Continuous  ipratropium-albuterol 0.5 mg-3 mg(2.5 mg base)/3 mL Solution for Nebulization, 3 mL, Nebulization, Q4H PRN  [Held by provider] lisinopril (PRINIVIL) tablet, 20 mg, Oral, Daily  morphine 4 mg/mL injection, 4 mg, Intravenous, Q5 Min PRN  nicotine (NICODERM CQ) transdermal patch (mg/24 hr), 14 mg, Transdermal, Daily  nitroGLYCERIN (NITROSTAT) sublingual tablet, 0.4 mg, Sublingual, Q5 Min PRN  NS 250 mL flush bag, , Intravenous, Q1H PRN  NS flush syringe, 10 mL, Intravenous, Q8HRS  NS flush syringe, 10 mL, Intravenous, Q1H PRN  predniSONE (DELTASONE) tablet, 20 mg, Oral, 2x/day-Food  prochlorperazine (COMPAZINE) 5 mg/mL injection, 10 mg, Intravenous, Q6H PRN      Family History:     Family Medical History:    None           Social History:     Social History     Tobacco Use   . Smoking status: Every Day     Packs/day: 1.00     Types: Cigarettes   . Smokeless tobacco: Never   Substance Use Topics   . Alcohol use: No   .  Drug use: No       Review of Systems:     Constitutional: negative for fevers, chills, fatigue, or unintentional weight loss  Eyes: negative for visual disturbance, irritation or redness  Ears, nose, mouth, throat, and face: negative for hearing loss, tinnitus or hoarseness  Respiratory: negative for cough, wheezing, or shortness of breath  Cardiovascular: Positive for chest pain, palpitations, or DVT  Gastrointestinal: negative for nausea, vomiting, diarrhea, or difficulty swallowing  Hematologic/lymphatic: negative for easy bruising or bleeding  Musculoskeletal:negative for myalgias, arthralgias or muscle weakness  Neurological: negative for headaches, seizures or speech problems; negative history of TIA or CVA  Behavioral/Psych: negative for sleep disturbance or anorexia  Endocrine: negative for temperature intolerance or excessive sweating  Allergic/Immunologic: negative for urticaria or angioedema      Physical Exam:     Filed Vitals:    01/23/21 0330 01/23/21 0400 01/23/21 0500 01/23/21 0600   BP: (!) 173/76 (!) 171/79 (!) 162/76 (!) 166/73   Pulse: 67 77 63 63   Resp: 14 20 20 16    SpO2: 96% 94% 94% 94%     Body mass index is 26.47 kg/m.    Intake/Output Summary (Last 24 hours) at 01/23/2021 0834  Last data filed at 01/23/2021 0003  Gross per 24 hour   Intake 1000 ml   Output --   Net 1000 ml       General Apppearance: appropriate stated age. Alert, no acute distress.     Neck: supple, no carotid bruit, no JVD.   Respiratory: clear to auscultation bilaterally, no accessory muscle use, no wheezes.   Cardiovascular: normal rate, regular rhythm, s1 s2 normal, no murmur, no rubs, no gallops. Normal PMI.   Gastrointestinal: abdomen is soft, non-tender, and non-distended. Positive bowel sounds. No abdominal bruit.   Extremities: no clubbing, no cyanosis, no edema.   2+ bilateral radial pulses.   Skin: no rashes, no ecchymosis.   Neurologic: Alert, awake, oriented. Motor and sensory grossly intact. Speech output is  normal.   Musculoskeletal: no joint tenderness, deformity.  Psychiatric: normal affect. Normal behavior.       Labs Reviewed:   BMP:  Recent Labs     01/22/21  2221   SODIUM 142   POTASSIUM 4.1   CO2 23   BUN 28*   CREATININE 1.38*     Magnesium: No results found for this encounter  CBC:   Recent Labs     01/22/21  2221   WBC 15.9*   HGB 13.6   HCT 41.8   PLTCNT 191     Hepatic Function:   Recent Labs     01/22/21  2221   AST 24   ALT 18     Coags:   Recent Labs     01/23/21  0152   APTT 27.6     Cardiac Markers:   Recent Labs     01/22/21  2221   BNP 140*     TSH:    Recent Labs     01/22/21  2221   TSH 0.513     Lipids:   Recent Results (from the past 14000 hour(s))   LIPID PANEL    Collection Time: 01/22/21 10:21 PM   Result Value    CHOLESTEROL  174    HDL CHOL 48 (L)    TRIGLYCERIDES 88    LDL CALC 108 (H)    VLDL CALC 18    NON-HDL 126    CHOL/HDL RATIO 3.6       Cardiovascular Workup:     EKG (directly reviewed): Normal Sinus Rhythm, No Acute ST_T elevations or depressions    Telemetry (directly reviewed): Normal Sinus Rhythm    ECHO: Pending    LHC: Pending      Assessment:     Mr. Nicklaus Mackie. Hallak is a 80 year old man with PMH of PCI to RCA s/p 2 MI 2009 and 2011, Essential hypertension, Hyperlipidemia, admitted with NSTEMI. EKG shows NSR, no acute ST elevations or depressions. At present, cp free.     Plan:     1. NSTEMI:  -Hx of PCI to mid RCA  -Given symptoms of chest pain, elevated troponins and hx of CAD, recommend left heart catherization  -Monitor on telemetry  -Trend Troponins until trending down  -TTE pending; further recs based on TTE  -ASA 81mg  po daily  -Clopidogrel (Plavix) 75mg  po daily  -Heparin gtt per ACS protocol  -Carvedilol (Coreg) 12.5mg  po bid  -Lisinopril (Prinivil) 20mg  po daily  -Amlodipine (Norvasc) 5mg  po daily  -Atorvastatin (Lipitor) 40mg  po qhs    2. Essential Hypertension:  -Amlodipine (Norvasc) 5mg  po daily  -Carvedilol (Coreg) 12.5mg  po bid  -Lisinopril (Prinivil) 20mg  po  daily    3. Hyperlipidemia:  -Atorvastatin 40mg  po qhs    Thank you for consulting [facility] for the care of this patient.   Will follow while inpatient and call us as further questions arise.     Dr. Julian Hy MD  ABIM Board Certified In Cardiovascular Disease  ABIM Board Certified In Internal Medicine  [facility], [address]  Ph: [phone]  Ph: [phone]  (cell): [phone]     Please call with any questions.       Julian Hy, MD  01/23/2021, 08:34

## 2021-01-23 NOTE — ED Nurses Note (Signed)
Notified Bernette Redbird NP of patients elevated troponin of 389

## 2021-01-24 LAB — POC FINGERSTICK GLUCOSE - BMC/JMC (RESULTS)
GLUCOSE, POC: 110 mg/dl — ABNORMAL HIGH (ref 60–100)
GLUCOSE, POC: 146 mg/dl — ABNORMAL HIGH (ref 60–100)
GLUCOSE, POC: 155 mg/dl — ABNORMAL HIGH (ref 60–100)
GLUCOSE, POC: 165 mg/dl — ABNORMAL HIGH (ref 60–100)

## 2021-01-24 LAB — BASIC METABOLIC PANEL
ANION GAP: 9 mmol/L (ref 4–13)
BUN/CREA RATIO: 24 — ABNORMAL HIGH (ref 6–22)
BUN: 24 mg/dL (ref 8–25)
CALCIUM: 9.2 mg/dL (ref 8.8–10.2)
CHLORIDE: 109 mmol/L (ref 96–111)
CO2 TOTAL: 24 mmol/L (ref 23–31)
CREATININE: 0.99 mg/dL (ref 0.75–1.35)
ESTIMATED GFR: 78 mL/min/BSA (ref >=60)
GLUCOSE: 139 mg/dL — ABNORMAL HIGH (ref 65–125)
POTASSIUM: 4.3 mmol/L (ref 3.5–5.1)
SODIUM: 142 mmol/L (ref 136–145)

## 2021-01-24 LAB — PTT (PARTIAL THROMBOPLASTIN TIME)
APTT: 54.7 seconds — ABNORMAL HIGH (ref 24.0–36.5)
APTT: 40.6 seconds — ABNORMAL HIGH (ref 24.0–36.5)
APTT: 65.4 seconds — ABNORMAL HIGH (ref 24.0–36.5)

## 2021-01-24 LAB — CBC WITH DIFF
BASOPHIL #: <0.1 10*3/uL (ref <=0.20)
BASOPHIL %: 0 %
EOSINOPHIL #: <0.1 10*3/uL (ref <=0.50)
EOSINOPHIL %: 0 %
HCT: 37.6 % — ABNORMAL LOW (ref 38.9–52.0)
HGB: 12.2 g/dL — ABNORMAL LOW (ref 13.4–17.5)
IMMATURE GRANULOCYTE #: <0.1 10*3/uL (ref <0.10)
IMMATURE GRANULOCYTE %: 0 % (ref 0–1)
LYMPHOCYTE #: 1.11 10*3/uL (ref 1.00–4.80)
LYMPHOCYTE %: 9 %
MCH: 29.8 pg (ref 26.0–32.0)
MCHC: 32.4 g/dL (ref 31.0–35.5)
MCV: 91.9 fL (ref 78.0–100.0)
MONOCYTE #: 0.21 10*3/uL (ref 0.20–1.10)
MONOCYTE %: 2 %
MPV: 12.6 fL — ABNORMAL HIGH (ref 8.7–12.5)
NEUTROPHIL #: 10.66 10*3/uL — ABNORMAL HIGH (ref 1.50–7.70)
NEUTROPHIL %: 89 %
PLATELETS: 160 10*3/uL (ref 150–400)
RBC: 4.09 10*6/uL — ABNORMAL LOW (ref 4.50–6.10)
RDW-CV: 12.9 % (ref 11.5–15.5)
WBC: 12 10*3/uL — ABNORMAL HIGH (ref 3.7–11.0)

## 2021-01-24 MED ORDER — CLOPIDOGREL 75 MG TABLET
ORAL_TABLET | ORAL | Status: AC
Start: 2021-01-24 — End: 2021-01-24
  Administered 2021-01-24: 09:00:00 75 mg via ORAL
  Filled 2021-01-24: qty 1

## 2021-01-24 MED ORDER — MAGNESIUM SULFATE 2 GRAM/50 ML (4 %) IN WATER INTRAVENOUS PIGGYBACK
2.0000 g | INJECTION | Freq: Once | INTRAVENOUS | Status: AC
Start: 2021-01-24 — End: 2021-01-24
  Administered 2021-01-24: 14:00:00 2 g via INTRAVENOUS
  Administered 2021-01-24: 15:00:00 0 g via INTRAVENOUS
  Filled 2021-01-24: qty 50

## 2021-01-24 MED ORDER — CARVEDILOL 3.125 MG TABLET
6.2500 mg | ORAL_TABLET | Freq: Two times a day (BID) | ORAL | Status: DC
Start: 2021-01-24 — End: 2021-01-27
  Administered 2021-01-24 – 2021-01-26 (×4): 6.25 mg via ORAL
  Administered 2021-01-26: 17:00:00 0 mg via ORAL
  Administered 2021-01-27: 10:00:00 6.25 mg via ORAL
  Filled 2021-01-24 (×7): qty 2

## 2021-01-24 NOTE — Nurses Notes (Signed)
POC reviewed with pt/family. A/OX4. Denies pain/CP/SOB. VSS. Admit from ER. Hep gtt continues to infuse at protocol rate. Assisted with aDLs appropriately. Encouraged OOB time. Safety and falls precaution in place. Will continue to monitor pt needs.

## 2021-01-24 NOTE — ED Nurses Note (Addendum)
Per Dr Barbara Cower, patient will go to cath lab on Monday. Patient to be NPO at midnight (Sunday).

## 2021-01-24 NOTE — Progress Notes (Signed)
[facility]  Progress Note      Date Time: 01/24/2021 13:22  Patient Name: [name]  MRN#: [id number]  DOB: Oct 11, 1940  Consulting Physician: Percival Spanish, MD  Length of stay: 1 days    SUBJECTIVE:     No cardiac complaints    Past Medical History:     Past Medical History:   Diagnosis Date    Coronary artery disease     High cholesterol     HTN (hypertension)     Wears glasses          Past Surgical History:     Past Surgical History:   Procedure Laterality Date    HX CORONARY ARTERY BYPASS GRAFT           Allergies:     Allergies   Allergen Reactions    Aspirin Hives/ Urticaria     Outpatient Medications:     Prior to Admission Medications   Prescriptions Last Dose Informant Patient Reported? Taking?   amLODIPine (NORVASC) 5 mg Oral Tablet   Yes Yes   Sig: Take 1 Tablet (5 mg total) by mouth   azithromycin (ZITHROMAX) 250 mg Oral Tablet   Yes Yes   Sig: 2 Po on day 1 and 1 PO day 2-5   carvedilol (COREG) 12.5 mg Oral Tablet   Yes No   Sig: Take 12.5 mg by mouth Twice daily with food   clopidogrel (PLAVIX) 75 mg Oral Tablet   Yes No   Sig: Take 75 mg by mouth Once a day   lisinopril (PRINIVIL) 20 mg Oral Tablet   Yes No   Sig: Take 20 mg by mouth Once a day   predniSONE (DELTASONE) 20 mg Oral Tablet   Yes Yes   Sig: 3 PO daily for 3 days, 2 PO daily for 3 days, 1 PO daily for 3 days   simvastatin (ZOCOR) 40 mg Oral Tablet   Yes No   Sig: Take 40 mg by mouth Every evening      Facility-Administered Medications: None       Inpatient Medications:     acetaminophen (TYLENOL) tablet, 1,000 mg, Oral, Q6H PRN  amLODIPine (NORVASC) tablet, 5 mg, Oral, Daily  atorvastatin (LIPITOR) tablet, 40 mg, Oral, QPM  carvedilol (COREG) tablet, 6.25 mg, Oral, 2x/day-Food  clopidogrel (PLAVIX) 75 mg tablet, 75 mg, Oral, Daily  SSIP insulin lispro (HUMALOG) 100 units/mL SubQ pen, 1-5 Units, Subcutaneous, 4x/day AC   And  dextrose 50% (0.5 g/mL) injection - syringe, 12.5 g, Intravenous, Q15 Min PRN  heparin 25,000  units in 0.45% NS 250 mL infusion, 12 Units/kg/hr (Adjusted), Intravenous, Continuous  ipratropium-albuterol 0.5 mg-3 mg(2.5 mg base)/3 mL Solution for Nebulization, 3 mL, Nebulization, Q4H PRN  lisinopril (PRINIVIL) tablet, 20 mg, Oral, Daily  magnesium sulfate 2 G in SW 50 mL premix IVPB, 2 g, Intravenous, Once  morphine 4 mg/mL injection, 4 mg, Intravenous, Q5 Min PRN  nitroGLYCERIN (NITROSTAT) sublingual tablet, 0.4 mg, Sublingual, Q5 Min PRN  NS 250 mL flush bag, , Intravenous, Q1H PRN  NS flush syringe, 10 mL, Intravenous, Q8HRS  NS flush syringe, 10 mL, Intravenous, Q1H PRN  predniSONE (DELTASONE) tablet, 20 mg, Oral, 2x/day-Food  prochlorperazine (COMPAZINE) 5 mg/mL injection, 10 mg, Intravenous, Q6H PRN      Family History:     Family Medical History:    None           Social History:     Social History  Tobacco Use    Smoking status: Every Day     Packs/day: 1.00     Types: Cigarettes    Smokeless tobacco: Never   Substance Use Topics    Alcohol use: No    Drug use: No       Review of Systems:     Constitutional: negative for fevers, chills, fatigue, or unintentional weight loss  Eyes: negative for visual disturbance, irritation or redness  Ears, nose, mouth, throat, and face: negative for hearing loss, tinnitus or hoarseness  Respiratory: negative for cough, wheezing, or shortness of breath  Cardiovascular:   negative for chest pain, palpitations, or DVT  Gastrointestinal: negative for nausea, vomiting, diarrhea, or difficulty swallowing  Hematologic/lymphatic: negative for easy bruising or bleeding  Musculoskeletal:negative for myalgias, arthralgias or muscle weakness  Neurological: negative for headaches, seizures or speech problems; negative history of TIA or CVA  Behavioral/Psych: negative for sleep disturbance or anorexia  Endocrine: negative for temperature intolerance or excessive sweating  Allergic/Immunologic: negative for urticaria or angioedema    Physical Exam:     Filed Vitals:     01/24/21 0730 01/24/21 0745 01/24/21 0800 01/24/21 0815   BP:   (!) 160/72    Pulse: 64 56 58 64   Resp: 17 20 13 16    SpO2: 95% 95% 96% 97%     Body mass index is 26.47 kg/m.  No intake or output data in the 24 hours ending 01/24/21 1322    General Apppearance: appropriate stated age. Alert, no acute distress.     Neck: supple, no carotid bruit, no JVD.   Respiratory: clear to auscultation bilaterally, no accessory muscle use, no wheezes.   Cardiovascular: normal rate, regular rhythm, s1 s2 normal, no murmur, no rubs, no gallops. Normal PMI.   Gastrointestinal: abdomen is soft, non-tender, and non-distended. Positive bowel sounds. No abdominal bruit.   Extremities: no clubbing, no cyanosis, no edema.   2+ bilateral radial pulses.   Skin: no rashes, no ecchymosis.   Neurologic: Alert, awake, oriented. Motor and sensory grossly intact. Speech output is normal.   Musculoskeletal: no joint tenderness, deformity.  Psychiatric: normal affect. Normal behavior.       Labs Reviewed:   BMP:   Recent Labs     01/22/21  2221 01/24/21  0316   SODIUM 142 142   POTASSIUM 4.1 4.3   CO2 23 24   BUN 28* 24   CREATININE 1.38* 0.99     Magnesium:   Recent Labs     01/22/21  2221   MAGNESIUM 1.7*     CBC:   Recent Labs     01/22/21  2221 01/24/21  0316   WBC 15.9* 12.0*   HGB 13.6 12.2*   HCT 41.8 37.6*   PLTCNT 191 160     Hepatic Function:   Recent Labs     01/22/21  2221   AST 24   ALT 18     Coags:   Recent Labs     01/23/21  0152 01/23/21  0917 01/23/21  1720 01/23/21  2327 01/24/21  0809   APTT 27.6 188.7* 50.5* 48.1* 54.7*     Cardiac Markers:   Recent Labs     01/22/21  2221   BNP 140*     TSH:    Recent Labs     01/22/21  2221   TSH 0.513     Lipids:   Recent Results (from the past 2222 hour(s))   LIPID PANEL  Collection Time: 01/22/21 10:21 PM   Result Value    CHOLESTEROL  174    HDL CHOL 48 (L)    TRIGLYCERIDES 88    LDL CALC 108 (H)    VLDL CALC 18    NON-HDL 126    CHOL/HDL RATIO 3.6       Cardiovascular Workup:      EKG (directly reviewed): Normal Sinus Rhythm, No Acute ST_T elevations or depressions    Telemetry (directly reviewed): Normal Sinus Rhythm    ECHO: Pending    LHC: Pending    Assessment:     Mr. Jante Valbuena. 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 EKG shows NSR, no acute ST elevations or depressions. At present, cp free.     Plan:     1. NSTEMI:  -Hx of PCI to mid RCA  -Given symptoms of chest pain, elevated troponins and hx of CAD, recommend left heart catherization; NPO after MN Sunday for [facility] Monday  -Monitor on telemetry  -Trend Troponins until trending down  -TTE pending; further recs based on TTE  -ASA 81mg  po daily  -Clopidogrel (Plavix) 75mg  po daily  -Heparin gtt per ACS protocol  -Carvedilol (Coreg) 12.5mg  po bid  -Lisinopril (Prinivil) 20mg  po daily  -Amlodipine (Norvasc) 5mg  po daily  -Atorvastatin (Lipitor) 40mg  po qhs    2. Essential Hypertension:  -Amlodipine (Norvasc) 5mg  po daily  -Carvedilol (Coreg) 12.5mg  po bid  -Lisinopril (Prinivil) 20mg  po daily    3. Hyperlipidemia:  -Atorvastatin 40mg  po qhs    Thank you for consulting [facility] for the care of this patient.   Will follow while inpatient and call us as further questions arise.    Dr. Julian Hy MD  ABIM Board Certified In Cardiovascular Disease  ABIM Board Certified In Internal Medicine  [facility], [address]  Ph: [phone]  Ph: [phone]  (cell): [phone]    Please call with any questions.     Julian Hy, MD  01/24/2021, 13:22

## 2021-01-24 NOTE — Progress Notes (Signed)
[facility]     IP PROGRESS NOTE      Langley, Ingalls  Date of Admission:  01/22/2021  Date of Birth:  [birth year]  Date of Service:  01/24/2021    Chief Complaint: chest pain    Subjective:  No acute event, pt remains on room air or low floe NC oxygen overnight, chest pain free, no acute distress. Pt is aware of cardiology plan for [facility] on Monday. Is waiting for a floor bed.       Problem List:  Active Hospital Problems   (*Primary Problem)    Diagnosis    *Chest pain    Hypomagnesemia    Type 1 non-ST elevation myocardial infarction (NSTEMI) (CMS HCC)     Added automatically from request for surgery [id number]      Hypertension    Dyslipidemia    CAD S/P multiple PCI to mid RCA in 2009 and 2011       Assessment/ Plan: a 80 y.o., White male with a history of HTN, HLD, and CAD who presented to the ED with chest pain and diagnosed NSTEMI.     NSTEMI with hx of CAD and PCI, hx of HLD  - pt is allergic to ASA, continue Plavix, Lipitor 40mg   - coreg 6.25mg  bid titrate up as tolerated  - continue heparin gtt, monitor PTT  - NPO after MN on Sunday and plan for [facility] on Monday per cardiology    HTN  - amlodipine 5mg   - coreg 6.25mg  bid  - lisinopril 20 mg    Hypomagnesemia  - replete, monitor, keep Mg >2        Code status:full code  DVT proph:on heparin gtt        Vital Signs:  No data recorded.      BP (Non-Invasive): (!) 160/72  MAP (Non-Invasive): 100 mmHG  Heart Rate: 64  Respiratory Rate: 16  SpO2: 97 %      Objective     Current Medications:  acetaminophen (TYLENOL) tablet, 1,000 mg, Oral, Q6H PRN  amLODIPine (NORVASC) tablet, 5 mg, Oral, Daily  atorvastatin (LIPITOR) tablet, 40 mg, Oral, QPM  carvedilol (COREG) tablet, 6.25 mg, Oral, 2x/day-Food  clopidogrel (PLAVIX) 75 mg tablet, 75 mg, Oral, Daily  SSIP insulin lispro (HUMALOG) 100 units/mL SubQ pen, 1-5 Units, Subcutaneous, 4x/day AC   And  dextrose 50% (0.5 g/mL) injection - syringe, 12.5 g, Intravenous, Q15 Min PRN  heparin 25,000 units in 0.45% NS 250  mL infusion, 12 Units/kg/hr (Adjusted), Intravenous, Continuous  ipratropium-albuterol 0.5 mg-3 mg(2.5 mg base)/3 mL Solution for Nebulization, 3 mL, Nebulization, Q4H PRN  lisinopril (PRINIVIL) tablet, 20 mg, Oral, Daily  magnesium sulfate 2 G in SW 50 mL premix IVPB, 2 g, Intravenous, Once  morphine 4 mg/mL injection, 4 mg, Intravenous, Q5 Min PRN  nitroGLYCERIN (NITROSTAT) sublingual tablet, 0.4 mg, Sublingual, Q5 Min PRN  NS 250 mL flush bag, , Intravenous, Q1H PRN  NS flush syringe, 10 mL, Intravenous, Q8HRS  NS flush syringe, 10 mL, Intravenous, Q1H PRN  predniSONE (DELTASONE) tablet, 20 mg, Oral, 2x/day-Food  prochlorperazine (COMPAZINE) 5 mg/mL injection, 10 mg, Intravenous, Q6H PRN        Today's Physical Exam:  General: appears in good health. No distress.   Eyes: Pupils equal and round, reactive to light and accomodation.   HENT:Head atraumatic and normocephalic   Neck: No JVD or thyromegaly or lymphadenopathy   Lungs: CTAB, non labored breathing, no rales or wheezing.    Cardiovascular:  regular rate and rhythm, S1, S2 normal, no murmur,   Abdomen: Soft, non-tender, Bowel sounds normal, No hepatosplenomegaly   Extremities: extremities normal, atraumatic, no cyanosis or edema   Skin: Skin warm and dry   Neurologic: Grossly normal   Psychiatric: Normal affect, behavior,         I/O:  I/O last 24 hours:  No intake or output data in the 24 hours ending 01/24/21 0910  I/O current shift:  No intake/output data recorded.      Labs  Please indicate ordered or reviewed)  Reviewed: I have reviewed all lab results.    CBC (Last 48 Hours):    Recent Results in last 48 hours     01/22/21  2221 01/24/21  0316   WBC 15.9* 12.0*   HGB 13.6 12.2*   HCT 41.8 37.6*   MCV 91.1 91.9   PLTCNT 191 160        BMP (Last 24 Hours):    Recent Results last 24 hours     01/24/21  0316   SODIUM 142   POTASSIUM 4.3   CHLORIDE 109   CO2 24   BUN 24   CREATININE 0.99   CALCIUM 9.2   GLUCOSENF 139*           Radiology Tests (Please  indicate ordered or reviewed)  Reviewed: personally reviewed all available images    Medical decision making:  All pertinent labs were personally reviewed with additional follow-up lab work ordered as deemed clinically appropriate.  All available imaging results and EKGs were reviewed and independently interpreted.  Pertinent previous medical record results were reviewed.  Further workup and treatment depending on clinical cours      Estevan Oaks, MD PhD  01/24/2021, 09:02     Portions of this note may be dictated using voice recognition software or a dictation service. Variances in spelling and vocabulary are possible and unintentional. Not all errors are caught/corrected. Please notify the Thereasa Parkin if any discrepancies are noted or if the meaning of any statement is not clear.

## 2021-01-24 NOTE — ED Nurses Note (Signed)
Pt resting with eyes closed. Eyes open to voice. Denies complaints. VSS.

## 2021-01-24 NOTE — ED Nurses Note (Signed)
Med change. See MAR for details. Pt denies complaints

## 2021-01-24 NOTE — ED Nurses Note (Signed)
Pt resting with eyes closed. Eyes open when I enter room. No complaints voiced. VSS. Will continue to assess

## 2021-01-24 NOTE — ED Nurses Note (Signed)
Pt resting with eyes closed. Even rise and fall of chest noted. VSS

## 2021-01-24 NOTE — ED Nurses Note (Signed)
Report given Megan, RN

## 2021-01-24 NOTE — ED Nurses Note (Signed)
VSS. Pt voices no complaints

## 2021-01-24 NOTE — ED Nurses Note (Signed)
Aptt 54.7 at this time= no change per protocol

## 2021-01-24 NOTE — Care Plan (Signed)
Problem: Adult Inpatient Plan of Care  Goal: Plan of Care Review  Outcome: Ongoing (see interventions/notes)  Goal: Patient-Specific Goal (Individualized)  Outcome: Ongoing (see interventions/notes)  Flowsheets (Taken 01/24/2021 1600)  Individualized Care Needs: none  Anxieties, Fears or Concerns: none  Goal: Absence of Hospital-Acquired Illness or Injury  Outcome: Ongoing (see interventions/notes)  Goal: Optimal Comfort and Wellbeing  Outcome: Ongoing (see interventions/notes)  Goal: Rounds/Family Conference  Outcome: Ongoing (see interventions/notes)     Problem: Chest Pain  Goal: Resolution of Chest Pain Symptoms  Outcome: Ongoing (see interventions/notes)     Problem: Fall Injury Risk  Goal: Absence of Fall and Fall-Related Injury  Outcome: Ongoing (see interventions/notes)

## 2021-01-25 DIAGNOSIS — I340 Nonrheumatic mitral (valve) insufficiency: Secondary | ICD-10-CM

## 2021-01-25 DIAGNOSIS — Z955 Presence of coronary angioplasty implant and graft: Secondary | ICD-10-CM

## 2021-01-25 DIAGNOSIS — I252 Old myocardial infarction: Secondary | ICD-10-CM

## 2021-01-25 DIAGNOSIS — I214 Non-ST elevation (NSTEMI) myocardial infarction: Principal | ICD-10-CM

## 2021-01-25 DIAGNOSIS — Z7902 Long term (current) use of antithrombotics/antiplatelets: Secondary | ICD-10-CM

## 2021-01-25 DIAGNOSIS — Z886 Allergy status to analgesic agent status: Secondary | ICD-10-CM

## 2021-01-25 DIAGNOSIS — I2510 Atherosclerotic heart disease of native coronary artery without angina pectoris: Secondary | ICD-10-CM

## 2021-01-25 DIAGNOSIS — I472 Ventricular tachycardia, unspecified: Secondary | ICD-10-CM

## 2021-01-25 DIAGNOSIS — E785 Hyperlipidemia, unspecified: Secondary | ICD-10-CM

## 2021-01-25 DIAGNOSIS — I10 Essential (primary) hypertension: Secondary | ICD-10-CM

## 2021-01-25 DIAGNOSIS — R7303 Prediabetes: Secondary | ICD-10-CM

## 2021-01-25 LAB — POC FINGERSTICK GLUCOSE - BMC/JMC (RESULTS)
GLUCOSE, POC: 99 mg/dl (ref 60–100)
GLUCOSE, POC: 97 mg/dl (ref 60–100)
GLUCOSE, POC: 117 mg/dl — ABNORMAL HIGH (ref 60–100)
GLUCOSE, POC: 126 mg/dl — ABNORMAL HIGH (ref 60–100)

## 2021-01-25 LAB — PTT (PARTIAL THROMBOPLASTIN TIME)
APTT: 52 seconds — ABNORMAL HIGH (ref 24.0–36.5)
APTT: 59.2 seconds — ABNORMAL HIGH (ref 24.0–36.5)
APTT: 67.1 seconds — ABNORMAL HIGH (ref 24.0–36.5)
APTT: 70.1 seconds — ABNORMAL HIGH (ref 24.0–36.5)
APTT: 63.7 seconds — ABNORMAL HIGH (ref 24.0–36.5)

## 2021-01-25 MED ORDER — ATORVASTATIN 40 MG TABLET
80.0000 mg | ORAL_TABLET | Freq: Every evening | ORAL | Status: DC
Start: 2021-01-25 — End: 2021-01-27
  Administered 2021-01-25 – 2021-01-26 (×2): 80 mg via ORAL
  Filled 2021-01-25 (×2): qty 2

## 2021-01-25 NOTE — Progress Notes (Signed)
[facility]     IP PROGRESS NOTE      Ismail, Bester  Date of Admission:  01/22/2021  Date of Birth:  10-May-1940  Date of Service:  01/25/2021    Chief Complaint: chest pain    Subjective:  No acute event, pt remains on room air , on heparin gtt,  chest pain free, BP is slightly higher this morning, pt has no acute distress.     Pt is waiting for [facility] on Monday.      Problem List:  Active Hospital Problems   (*Primary Problem)    Diagnosis   . *Chest pain   . Hypomagnesemia   . Type 1 non-ST elevation myocardial infarction (NSTEMI) (CMS [facility])     Added automatically from request for surgery [id number]     . Hypertension   . Dyslipidemia   . CAD S/P multiple PCI to mid RCA in 2009 and 2011       Assessment/ Plan: a 80 y.o., White male with a history of HTN, HLD, and CAD who presented to the ED with chest pain and diagnosed NSTEMI.     NSTEMI with hx of CAD and PCI, hx of HLD  - ECHO on 12/2/20222  Conclusions:  Technically difficult study due to limited acoustic windows.  Left ventricular systolic function is normal.  The left ventricular ejection fraction by visual assessment is estimated to be 50-55%.  - pt is allergic to ASA, continue Plavix, Lipitor 40mg   - coreg 6.25mg  bid titrate up as tolerated  - continue heparin gtt, monitor PTT  - NPO after MN on Sunday and plan for [facility] on Monday per cardiology    HTN  - amlodipine 5mg   - coreg 6.25mg  bid  - lisinopril 20 mg    Hypomagnesemia  - replete, monitor, keep Mg >2    Prediabetes  - A1c was 5.9%, lifestyle intervention encouraged.    Code status:full code  DVT proph:on heparin gtt        Vital Signs:  Temp (24hrs) Max:36.8 C (98.2 F)      Temperature: 36.4 C (97.6 F)  BP (Non-Invasive): (!) 176/73  MAP (Non-Invasive): 95 mmHG  Heart Rate: 63  Respiratory Rate: 18  SpO2: 94 %      Objective     Current Medications:  acetaminophen (TYLENOL) tablet, 1,000 mg, Oral, Q6H PRN  amLODIPine (NORVASC) tablet, 5 mg, Oral, Daily  atorvastatin (LIPITOR) tablet, 40 mg,  Oral, QPM  carvedilol (COREG) tablet, 6.25 mg, Oral, 2x/day-Food  clopidogrel (PLAVIX) 75 mg tablet, 75 mg, Oral, Daily  SSIP insulin lispro (HUMALOG) 100 units/mL SubQ pen, 1-5 Units, Subcutaneous, 4x/day AC   And  dextrose 50% (0.5 g/mL) injection - syringe, 12.5 g, Intravenous, Q15 Min PRN  heparin 25,000 units in 0.45% NS 250 mL infusion, 12 Units/kg/hr (Adjusted), Intravenous, Continuous  ipratropium-albuterol 0.5 mg-3 mg(2.5 mg base)/3 mL Solution for Nebulization, 3 mL, Nebulization, Q4H PRN  lisinopril (PRINIVIL) tablet, 20 mg, Oral, Daily  morphine 4 mg/mL injection, 4 mg, Intravenous, Q5 Min PRN  nitroGLYCERIN (NITROSTAT) sublingual tablet, 0.4 mg, Sublingual, Q5 Min PRN  NS 250 mL flush bag, , Intravenous, Q1H PRN  NS flush syringe, 10 mL, Intravenous, Q8HRS  NS flush syringe, 10 mL, Intravenous, Q1H PRN  predniSONE (DELTASONE) tablet, 20 mg, Oral, 2x/day-Food  prochlorperazine (COMPAZINE) 5 mg/mL injection, 10 mg, Intravenous, Q6H PRN        Today's Physical Exam:  General: appears in good health. No distress.  Eyes: Pupils equal and round, reactive to light and accomodation.   HENT:Head atraumatic and normocephalic   Neck: No JVD or thyromegaly or lymphadenopathy   Lungs: CTAB, non labored breathing, no rales or wheezing.    Cardiovascular: regular rate and rhythm, S1, S2 normal, no murmur,   Abdomen: Soft, non-tender, Bowel sounds normal, No hepatosplenomegaly   Extremities: extremities normal, atraumatic, no cyanosis or edema   Skin: Skin warm and dry   Neurologic: Grossly normal   Psychiatric: Normal affect, behavior,         I/O:  I/O last 24 hours:      Intake/Output Summary (Last 24 hours) at 01/25/2021 0823  Last data filed at 01/24/2021 1800  Gross per 24 hour   Intake 480 ml   Output --   Net 480 ml     I/O current shift:  No intake/output data recorded.      Labs  Please indicate ordered or reviewed)  Reviewed: I have reviewed all lab results.    CBC (Last 48 Hours):    Recent Results in last  48 hours     01/24/21  0316   WBC 12.0*   HGB 12.2*   HCT 37.6*   MCV 91.9   PLTCNT 160        BMP (Last 24 Hours):    No results for input(s): SODIUM, POTASSIUM, CHLORIDE, CO2, BUN, CREATININE, CALCIUM, GLUCOSENF in the last 24 hours.        Radiology Tests (Please indicate ordered or reviewed)  Reviewed: personally reviewed all available images    Medical decision making:  All pertinent labs were personally reviewed with additional follow-up lab work ordered as deemed clinically appropriate.  All available imaging results and EKGs were reviewed and independently interpreted.  Pertinent previous medical record results were reviewed.  Further workup and treatment depending on clinical cours      Estevan Oaks, MD PhD  01/24/2021, 09:02     Portions of this note may be dictated using voice recognition software or a dictation service. Variances in spelling and vocabulary are possible and unintentional. Not all errors are caught/corrected. Please notify the There[facility] Parkin if any discrepancies are noted or if the meaning of any statement is not clear.

## 2021-01-25 NOTE — Care Plan (Signed)
Problem: Adult Inpatient Plan of Care  Goal: Plan of Care Review  Outcome: Ongoing (see interventions/notes)  Goal: Patient-Specific Goal (Individualized)  Outcome: Ongoing (see interventions/notes)  Goal: Absence of Hospital-Acquired Illness or Injury  Outcome: Ongoing (see interventions/notes)  Intervention: Identify and Manage Fall Risk  Recent Flowsheet Documentation  Taken 01/25/2021 0830 by Macarthur Critchley, RN  Safety Promotion/Fall Prevention:   activity supervised   fall prevention program maintained   muscle strengthening facilitated   motion sensor pad activated   nonskid shoes/slippers when out of bed   safety round/check completed  Intervention: Prevent Skin Injury  Recent Flowsheet Documentation  Taken 01/25/2021 0830 by Macarthur Critchley, RN  Body Position:   supine   positioned/repositioned independently  Skin Protection:   adhesive use limited   transparent dressing maintained   tubing/devices free from skin contact  Intervention: Prevent and Manage VTE (Venous Thromboembolism) Risk  Recent Flowsheet Documentation  Taken 01/25/2021 0830 by Macarthur Critchley, RN  VTE Prevention/Management: anticoagulant therapy maintained  Intervention: Prevent Infection  Recent Flowsheet Documentation  Taken 01/25/2021 0830 by Macarthur Critchley, RN  Infection Prevention:   promote handwashing   rest/sleep promoted  Goal: Optimal Comfort and Wellbeing  Outcome: Ongoing (see interventions/notes)  Intervention: Provide Person-Centered Care  Recent Flowsheet Documentation  Taken 01/25/2021 0830 by Macarthur Critchley, RN  Trust Relationship/Rapport:   care explained   choices provided   empathic listening provided   emotional support provided   questions answered   questions encouraged   reassurance provided   thoughts/feelings acknowledged  Goal: Rounds/Family Conference  Outcome: Ongoing (see interventions/notes)     Problem: Chest Pain  Goal: Resolution of Chest Pain Symptoms  Outcome: Ongoing (see interventions/notes)     Problem: Fall Injury  Risk  Goal: Absence of Fall and Fall-Related Injury  Outcome: Ongoing (see interventions/notes)  Intervention: Promote Injury-Free Environment  Recent Flowsheet Documentation  Taken 01/25/2021 0830 by Macarthur Critchley, RN  Safety Promotion/Fall Prevention:   activity supervised   fall prevention program maintained   muscle strengthening facilitated   motion sensor pad activated   nonskid shoes/slippers when out of bed   safety round/check completed

## 2021-01-25 NOTE — Nurses Notes (Signed)
POC reviewed with pt. A/Ox4. Denies pain/CP/SOB. VSS. Independent with ADLs. Hep gtt found to have different rate from previous protocol rate from night shift. Pt aptt has been therapeutic throughout shift. MD notified. Ordered aptt to make sure aptt is still therapeutic. Safety and falls precaution in place. Will continue to monitor pt needs.

## 2021-01-25 NOTE — Progress Notes (Addendum)
[facility]  Progress Note      Date Time: 01/25/2021 13:05  Patient Name: [name]  MRN#: [id number]  DOB: [birth year]  Consulting Physician: Percival Spanish, MD  Length of stay: 2 days    SUBJECTIVE:     No cardiac complaints  Later informed by RN that patient has run of NSVT  Troponin max at 665 trending down  Telemetry shows NSR at present  On Heparin gtt    Past Medical History:     Past Medical History:   Diagnosis Date   . Coronary artery disease    . High cholesterol    . HTN (hypertension)    . Wears glasses          Past Surgical History:     Past Surgical History:   Procedure Laterality Date   . HX CORONARY ARTERY BYPASS GRAFT           Allergies:     Allergies   Allergen Reactions   . Aspirin Hives/ Urticaria     Outpatient Medications:     Prior to Admission Medications   Prescriptions Last Dose Informant Patient Reported? Taking?   amLODIPine (NORVASC) 5 mg Oral Tablet   Yes Yes   Sig: Take 1 Tablet (5 mg total) by mouth   azithromycin (ZITHROMAX) 250 mg Oral Tablet   Yes Yes   Sig: 2 Po on day 1 and 1 PO day 2-5   carvedilol (COREG) 12.5 mg Oral Tablet   Yes No   Sig: Take 12.5 mg by mouth Twice daily with food   clopidogrel (PLAVIX) 75 mg Oral Tablet   Yes No   Sig: Take 75 mg by mouth Once a day   lisinopril (PRINIVIL) 20 mg Oral Tablet   Yes No   Sig: Take 20 mg by mouth Once a day   predniSONE (DELTASONE) 20 mg Oral Tablet   Yes Yes   Sig: 3 PO daily for 3 days, 2 PO daily for 3 days, 1 PO daily for 3 days   simvastatin (ZOCOR) 40 mg Oral Tablet   Yes No   Sig: Take 40 mg by mouth Every evening      Facility-Administered Medications: None       Inpatient Medications:     acetaminophen (TYLENOL) tablet, 1,000 mg, Oral, Q6H PRN  amLODIPine (NORVASC) tablet, 5 mg, Oral, Daily  atorvastatin (LIPITOR) tablet, 40 mg, Oral, QPM  carvedilol (COREG) tablet, 6.25 mg, Oral, 2x/day-Food  clopidogrel (PLAVIX) 75 mg tablet, 75 mg, Oral, Daily  SSIP insulin lispro (HUMALOG) 100 units/mL SubQ pen,  1-5 Units, Subcutaneous, 4x/day AC   And  dextrose 50% (0.5 g/mL) injection - syringe, 12.5 g, Intravenous, Q15 Min PRN  heparin 25,000 units in 0.45% NS 250 mL infusion, 12 Units/kg/hr (Adjusted), Intravenous, Continuous  ipratropium-albuterol 0.5 mg-3 mg(2.5 mg base)/3 mL Solution for Nebulization, 3 mL, Nebulization, Q4H PRN  lisinopril (PRINIVIL) tablet, 20 mg, Oral, Daily  morphine 4 mg/mL injection, 4 mg, Intravenous, Q5 Min PRN  nitroGLYCERIN (NITROSTAT) sublingual tablet, 0.4 mg, Sublingual, Q5 Min PRN  NS 250 mL flush bag, , Intravenous, Q1H PRN  NS flush syringe, 10 mL, Intravenous, Q8HRS  NS flush syringe, 10 mL, Intravenous, Q1H PRN  predniSONE (DELTASONE) tablet, 20 mg, Oral, 2x/day-Food  prochlorperazine (COMPAZINE) 5 mg/mL injection, 10 mg, Intravenous, Q6H PRN      Family History:     Family Medical History:    None  Social History:     Social History     Tobacco Use   . Smoking status: Every Day     Packs/day: 1.00     Types: Cigarettes   . Smokeless tobacco: Never   Substance Use Topics   . Alcohol use: No   . Drug use: No       Review of Systems:     Constitutional: negative for fevers, chills, fatigue, or unintentional weight loss  Eyes: negative for visual disturbance, irritation or redness  Ears, nose, mouth, throat, and face: negative for hearing loss, tinnitus or hoarseness  Respiratory: negative for cough, wheezing, or shortness of breath  Cardiovascular:   negative for chest pain, palpitations, or DVT  Gastrointestinal: negative for nausea, vomiting, diarrhea, or difficulty swallowing  Hematologic/lymphatic: negative for easy bruising or bleeding  Musculoskeletal:negative for myalgias, arthralgias or muscle weakness  Neurological: negative for headaches, seizures or speech problems; negative history of TIA or CVA  Behavioral/Psych: negative for sleep disturbance or anorexia  Endocrine: negative for temperature intolerance or excessive sweating  Allergic/Immunologic: negative for  urticaria or angioedema    Physical Exam:     Filed Vitals:    01/24/21 2013 01/25/21 0404 01/25/21 0739 01/25/21 1144   BP: (!) 145/81 (!) 151/79 (!) 176/73 (!) 160/68   Pulse: 64 66 63 60   Resp: 18 16 18 18    Temp: 36.7 C (98 F) 36.8 C (98.2 F) 36.4 C (97.6 F) 36.2 C (97.2 F)   SpO2: 97% 96% 94% 99%     Body mass index is 25.83 kg/m.    Intake/Output Summary (Last 24 hours) at 01/25/2021 1305  Last data filed at 01/25/2021 1000  Gross per 24 hour   Intake 840 ml   Output --   Net 840 ml       General Apppearance: appropriate stated age. Alert, no acute distress.     Neck: supple, no carotid bruit, no JVD.   Respiratory: clear to auscultation bilaterally, no accessory muscle use, no wheezes.   Cardiovascular: normal rate, regular rhythm, s1 s2 normal, no murmur, no rubs, no gallops. Normal PMI.   Gastrointestinal: abdomen is soft, non-tender, and non-distended. Positive bowel sounds. No abdominal bruit.   Extremities: no clubbing, no cyanosis, no edema.   2+ bilateral radial pulses.   Skin: no rashes, no ecchymosis.   Neurologic: Alert, awake, oriented. Motor and sensory grossly intact. Speech output is normal.   Musculoskeletal: no joint tenderness, deformity.  Psychiatric: normal affect. Normal behavior.       Labs Reviewed:   BMP:   Recent Labs     01/22/21  2221 01/24/21  0316   SODIUM 142 142   POTASSIUM 4.1 4.3   CO2 23 24   BUN 28* 24   CREATININE 1.38* 0.99     Magnesium:   Recent Labs     01/22/21  2221   MAGNESIUM 1.7*     CBC:   Recent Labs     01/22/21  2221 01/24/21  0316   WBC 15.9* 12.0*   HGB 13.6 12.2*   HCT 41.8 37.6*   PLTCNT 191 160     Hepatic Function:   Recent Labs     01/22/21  2221   AST 24   ALT 18     Coags:   Recent Labs     01/23/21  0152 01/23/21  0917 01/23/21  1720 01/23/21  2327 01/24/21  0809 01/24/21  1552 01/24/21  2207 01/25/21  4332 01/25/21  0623 01/25/21  1057   APTT 27.6 188.7* 50.5* 48.1* 54.7* 40.6* 65.4* 52.0* 59.2* 67.1*     Cardiac Markers:   Recent Labs      01/22/21  2221   BNP 140*     TSH:    Recent Labs     01/22/21  2221   TSH 0.513     Lipids:   Recent Results (from the past 95188 hour(s))   LIPID PANEL    Collection Time: 01/22/21 10:21 PM   Result Value    CHOLESTEROL  174    HDL CHOL 48 (L)    TRIGLYCERIDES 88    LDL CALC 108 (H)    VLDL CALC 18    NON-HDL 126    CHOL/HDL RATIO 3.6       Cardiovascular Workup:     EKG (directly reviewed): Normal Sinus Rhythm, No Acute ST_T elevations or depressions    Telemetry (directly reviewed): Normal Sinus Rhythm    ECHO: 01/23/2021    Conclusions:  Technically difficult study due to limited acoustic windows.  Left ventricular systolic function is normal.  The left ventricular ejection fraction by visual assessment is estimated to be 50-55%.  Findings  Left Ventricle: Normal left ventricular size. Normal geometry. Left ventricular systolic function is normal. The left ventricular ejection  fraction by visual assessment is estimated to be 50-55%. No segmental/regional wall motion abnormalities identified.  Right Ventricle: The right ventricle is not well visualized. Right ventricular systolic function cannot be assessed.  Left Atrium: The left atrium is normal in size.  Right Atrium: The right atrium is not well visualized.  Mitral Valve: Mitral valve leaflets appear mildly thickened. 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 There is mild mitral regurgitation.  Tricuspid Valve: The tricuspid valve is not well visualized. Trace tricuspid regurgitation present.  Aortic Valve: The aortic valve is mildly calcified. No Aortic valve stenosis. There is no evidence of aortic regurgitation.  Pulmonic Valve: The pulmonic valve is not well visualized. No significant pulmonic valve regurgitation present.  Atrial Septum: The interatrial septum is normal in appearance.  IVC/Hepatic Veins: Normal IVC size with >50% inspiratory collapse (estimated RA pressure 3 mmHg).  Aorta: The aortic root is of normal size.  Pericardium/Pleural space: Normal  pericardium with no pericardial effusion.    LHC: Pending    Assessment:     Mr. Larry Macneal. 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 EKG shows NSR, no acute ST elevations or depressions. At present, cp free.     Plan:     1. NSTEMI:  -Likely Type I  -Hx of multiple PCI to mid RCA 2009 and 2011 -patient has cath cards with him  -Given symptoms of chest pain, elevated troponins and hx of CAD, recommend left heart catherization; NPO after MN Sunday for [facility] Monday  -Monitor on telemetry  -Patient is allergic to Aspirin -recommend ASA desensitization protocol tomorrow. I have spoken to pharmacist and RN on the floor to inform them. Per Pharmacist, it is best done when there are most staff in-house and during the day shift when a physician is in the facility the whole desensitization time -which takes about 5 and a 1/2 hours. Pharmacist will also given all required paperwork -consent forms to RN to be signed tomorrow by patient.  -Clopidogrel (Plavix) 75mg  po daily  -Heparin gtt per ACS protocol  -Carvedilol (Coreg) 12.5mg   po bid  -Lisinopril (Prinivil) 20mg  po daily  -Amlodipine (Norvasc) 5mg  po daily  -Atorvastatin (Lipitor) 80mg  po qhs  -TTE shows LVEF of 50-55%; mild mitral stenosis and mild mitral regurgitation    2. Essential Hypertension:  -AHA stage III 170/62  -Amlodipine (Norvasc) 5mg  po daily- increase to 10mg  po daily  -Carvedilol (Coreg) 6.25mg  po bid with food  -Lisinopril (Prinivil) 20mg  po daily-titrate up as needed    3. Hyperlipidemia:  -Atorvastatin 80mg  po qhs    4. Prediabetes:  -HgA1C 5.9%      Thank you for consulting [facility] for the care of this patient.   Will follow while inpatient and call us as further questions arise.    Dr. Julian Hy MD  ABIM Board Certified In Cardiovascular Disease  ABIM Board Certified In Internal Medicine  [facility]  [place], [address]  Ph: [phone]  Ph: [phone]  (cell): [phone]    Please call with any questions.     Julian Hy, MD  01/24/2021, 13:22

## 2021-01-26 ENCOUNTER — Encounter (HOSPITAL_COMMUNITY)
Admission: EM | Disposition: A | Discharge status: Home / Self Care | Payer: Self-pay | Source: Home / Self Care | Attending: HOSPITALIST

## 2021-01-26 DIAGNOSIS — I2582 Chronic total occlusion of coronary artery: Secondary | ICD-10-CM

## 2021-01-26 DIAGNOSIS — R9431 Abnormal electrocardiogram [ECG] [EKG]: Secondary | ICD-10-CM

## 2021-01-26 DIAGNOSIS — T82855A Stenosis of coronary artery stent, initial encounter: Secondary | ICD-10-CM

## 2021-01-26 LAB — BASIC METABOLIC PANEL
ANION GAP: 6 mmol/L (ref 4–13)
BUN/CREA RATIO: 26 — ABNORMAL HIGH (ref 6–22)
BUN: 25 mg/dL (ref 8–25)
CALCIUM: 8.7 mg/dL — ABNORMAL LOW (ref 8.8–10.2)
CHLORIDE: 110 mmol/L (ref 96–111)
CO2 TOTAL: 25 mmol/L (ref 23–31)
CREATININE: 0.96 mg/dL (ref 0.75–1.35)
ESTIMATED GFR: 81 mL/min/BSA (ref >=60)
GLUCOSE: 124 mg/dL (ref 65–125)
POTASSIUM: 4.3 mmol/L (ref 3.5–5.1)
SODIUM: 141 mmol/L (ref 136–145)

## 2021-01-26 LAB — CBC WITH DIFF
BASOPHIL #: <0.1 10*3/uL (ref <=0.20)
BASOPHIL %: 0 %
EOSINOPHIL #: <0.1 10*3/uL (ref <=0.50)
EOSINOPHIL %: 0 %
HCT: 35.1 % — ABNORMAL LOW (ref 38.9–52.0)
HGB: 11.4 g/dL — ABNORMAL LOW (ref 13.4–17.5)
IMMATURE GRANULOCYTE #: 0.12 10*3/uL — ABNORMAL HIGH (ref <0.10)
IMMATURE GRANULOCYTE %: 1 % (ref 0–1)
LYMPHOCYTE #: 1.92 10*3/uL (ref 1.00–4.80)
LYMPHOCYTE %: 15 %
MCH: 29.5 pg (ref 26.0–32.0)
MCHC: 32.5 g/dL (ref 31.0–35.5)
MCV: 90.9 fL (ref 78.0–100.0)
MONOCYTE #: 0.84 10*3/uL (ref 0.20–1.10)
MONOCYTE %: 6 %
MPV: 12.3 fL (ref 8.7–12.5)
NEUTROPHIL #: 10.16 10*3/uL — ABNORMAL HIGH (ref 1.50–7.70)
NEUTROPHIL %: 78 %
PLATELETS: 147 10*3/uL — ABNORMAL LOW (ref 150–400)
RBC: 3.86 10*6/uL — ABNORMAL LOW (ref 4.50–6.10)
RDW-CV: 12.6 % (ref 11.5–15.5)
WBC: 13.1 10*3/uL — ABNORMAL HIGH (ref 3.7–11.0)

## 2021-01-26 LAB — MAGNESIUM: MAGNESIUM: 1.8 mg/dL (ref 1.8–2.6)

## 2021-01-26 LAB — POC ACT (RESULT)
ACT CELITE, POC: >400 seconds — ABNORMAL HIGH (ref 76–180)
ACT CELITE, POC: >400 seconds — ABNORMAL HIGH (ref 76–180)

## 2021-01-26 LAB — POC FINGERSTICK GLUCOSE - BMC/JMC (RESULTS)
GLUCOSE, POC: 105 mg/dl — ABNORMAL HIGH (ref 60–100)
GLUCOSE, POC: 107 mg/dl — ABNORMAL HIGH (ref 60–100)
GLUCOSE, POC: 104 mg/dl — ABNORMAL HIGH (ref 60–100)
GLUCOSE, POC: 111 mg/dl — ABNORMAL HIGH (ref 60–100)

## 2021-01-26 LAB — PTT (PARTIAL THROMBOPLASTIN TIME)
APTT: 86 seconds — ABNORMAL HIGH (ref 24.0–36.5)
APTT: 61.7 seconds — ABNORMAL HIGH (ref 24.0–36.5)

## 2021-01-26 SURGERY — CORONARY ANGIOGRAPHY W/LEFT HEART CATH W/WO LVG
Anesthesia: Other

## 2021-01-26 MED ORDER — NITROGLYCERIN 50 MCG/ML IV DILUTION - FOR ANES
INJECTION | Freq: Once | INTRAVENOUS | Status: DC | PRN
Start: 2021-01-26 — End: 2021-01-26
  Administered 2021-01-26: 15:00:00 200 ug via INTRA_ARTERIAL

## 2021-01-26 MED ORDER — HEPARIN (PORCINE) (PF) 1,000 UNIT/500 ML IN 0.9 % SODIUM CHLORIDE IV
INTRAVENOUS | Status: AC
Start: 2021-01-26 — End: 2021-01-26
  Filled 2021-01-26: qty 500

## 2021-01-26 MED ORDER — HEPARIN (PORCINE) (PF) 2,000 UNIT/1,000 ML IN 0.9 % SODIUM CHLORIDE IV
INTRAVENOUS | Status: AC
Start: 2021-01-26 — End: 2021-01-26
  Filled 2021-01-26: qty 1000

## 2021-01-26 MED ORDER — ASPIRIN 1 MG/ML ORAL LIQUID
5.0000 mg | Freq: Once | ORAL | Status: AC
Start: 2021-01-26 — End: 2021-01-26
  Administered 2021-01-26: 09:00:00 5 mg via ORAL
  Filled 2021-01-26: qty 5

## 2021-01-26 MED ORDER — ALBUTEROL SULFATE 2.5 MG/3 ML (0.083 %) SOLUTION FOR NEBULIZATION
2.5000 mg | INHALATION_SOLUTION | RESPIRATORY_TRACT | Status: DC | PRN
Start: 2021-01-26 — End: 2021-01-27

## 2021-01-26 MED ORDER — NITROGLYCERIN 50 MCG/ML INJECTION
INJECTION | INTRAVENOUS | Status: AC
Start: 2021-01-26 — End: 2021-01-26
  Filled 2021-01-26: qty 30

## 2021-01-26 MED ORDER — FAMOTIDINE (PF) 20 MG/2 ML INTRAVENOUS SOLUTION
20.0000 mg | Freq: Two times a day (BID) | INTRAVENOUS | Status: DC | PRN
Start: 2021-01-26 — End: 2021-01-27
  Filled 2021-01-26 (×3): qty 2

## 2021-01-26 MED ORDER — TICAGRELOR 60 MG TABLET
ORAL_TABLET | Freq: Once | ORAL | Status: DC | PRN
Start: 2021-01-26 — End: 2021-01-26
  Administered 2021-01-26: 17:00:00 180 mg via ORAL

## 2021-01-26 MED ORDER — TICAGRELOR 90 MG TABLET
90.0000 mg | ORAL_TABLET | Freq: Two times a day (BID) | ORAL | Status: DC
Start: 2021-01-27 — End: 2021-01-27
  Administered 2021-01-27: 06:00:00 90 mg via ORAL
  Filled 2021-01-26 (×2): qty 1

## 2021-01-26 MED ORDER — VERAPAMIL 2.5 MG/ML INTRAVENOUS SOLUTION
INTRAVENOUS | Status: AC
Start: 2021-01-26 — End: 2021-01-26
  Filled 2021-01-26: qty 2

## 2021-01-26 MED ORDER — HEPARIN (PORCINE) 1,000 UNIT/ML INJECTION SOLUTION
INTRAMUSCULAR | Status: AC
Start: 2021-01-26 — End: 2021-01-26
  Filled 2021-01-26: qty 10

## 2021-01-26 MED ORDER — LIDOCAINE HCL 10 MG/ML (1 %) INJECTION SOLUTION
INTRAMUSCULAR | Status: AC
Start: 2021-01-26 — End: 2021-01-26
  Filled 2021-01-26: qty 20

## 2021-01-26 MED ORDER — ASPIRIN 1 MG/ML ORAL LIQUID
20.0000 mg | Freq: Once | ORAL | Status: AC
Start: 2021-01-26 — End: 2021-01-26
  Administered 2021-01-26: 10:00:00 20 mg via ORAL
  Filled 2021-01-26: qty 20

## 2021-01-26 MED ORDER — ASPIRIN 81 MG CHEWABLE TABLET
81.0000 mg | CHEWABLE_TABLET | Freq: Every day | ORAL | Status: DC
Start: 2021-01-27 — End: 2021-01-27
  Administered 2021-01-27: 10:00:00 81 mg via ORAL
  Filled 2021-01-26: qty 1

## 2021-01-26 MED ORDER — TICAGRELOR 90 MG TABLET
ORAL_TABLET | ORAL | Status: AC
Start: 2021-01-26 — End: 2021-01-26
  Filled 2021-01-26: qty 2

## 2021-01-26 MED ORDER — ASPIRIN 1 MG/ML ORAL LIQUID
40.0000 mg | Freq: Once | ORAL | Status: AC
Start: 2021-01-26 — End: 2021-01-26
  Administered 2021-01-26: 12:00:00 40 mg via ORAL
  Filled 2021-01-26: qty 40

## 2021-01-26 MED ORDER — LIDOCAINE HCL 10 MG/ML (1 %) INJECTION SOLUTION
Freq: Once | INTRAMUSCULAR | Status: DC | PRN
Start: 2021-01-26 — End: 2021-01-26
  Administered 2021-01-26: 15:00:00 1 mL via INTRADERMAL

## 2021-01-26 MED ORDER — MIDAZOLAM (PF) 1 MG/ML INJECTION SOLUTION
Freq: Once | INTRAMUSCULAR | Status: DC | PRN
Start: 2021-01-26 — End: 2021-01-26
  Administered 2021-01-26 (×2): 1 mg via INTRAVENOUS

## 2021-01-26 MED ORDER — HEPARIN (PORCINE) (PF) 1,000 UNIT/500 ML IN 0.9 % SODIUM CHLORIDE IV
INTRAVENOUS | Status: AC | PRN
Start: 2021-01-26 — End: 2021-01-26
  Administered 2021-01-26: 3 mL/h via INTRA_ARTERIAL

## 2021-01-26 MED ORDER — HEPARIN (PORCINE) 5,000 UNIT/ML INJECTION SOLUTION
INTRAMUSCULAR | Status: AC
Start: 2021-01-26 — End: 2021-01-26
  Filled 2021-01-26: qty 1

## 2021-01-26 MED ORDER — ASPIRIN 1 MG/ML ORAL LIQUID
100.0000 mg | Freq: Once | ORAL | Status: AC
Start: 2021-01-26 — End: 2021-01-26
  Administered 2021-01-26: 14:00:00 100 mg via ORAL
  Filled 2021-01-26: qty 100

## 2021-01-26 MED ORDER — VERAPAMIL 2.5 MG/ML INTRAVENOUS SOLUTION
Freq: Once | INTRAVENOUS | Status: DC | PRN
Start: 2021-01-26 — End: 2021-01-26
  Administered 2021-01-26: 2.5 mg via INTRA_ARTERIAL

## 2021-01-26 MED ORDER — ASPIRIN 1 MG/ML ORAL LIQUID
10.0000 mg | Freq: Once | ORAL | Status: AC
Start: 2021-01-26 — End: 2021-01-26
  Administered 2021-01-26: 10:00:00 10 mg via ORAL
  Filled 2021-01-26: qty 10

## 2021-01-26 MED ORDER — SODIUM CHLORIDE 0.9 % INTRAVENOUS SOLUTION
INTRAVENOUS | Status: AC
Start: 2021-01-26 — End: 2021-01-27

## 2021-01-26 MED ORDER — METHYLPREDNISOLONE SOD SUCCINATE 40 MG/ML SOLUTION FOR INJ. WRAPPER
40.0000 mg | Freq: Once | INTRAMUSCULAR | Status: DC | PRN
Start: 2021-01-26 — End: 2021-01-27

## 2021-01-26 MED ORDER — HEPARIN (PORCINE) (PF) 2,000 UNIT/1,000 ML IN 0.9 % SODIUM CHLORIDE IV
Freq: Once | INTRAVENOUS | Status: DC | PRN
Start: 2021-01-26 — End: 2021-01-26
  Administered 2021-01-26: 500 mL

## 2021-01-26 MED ORDER — BENZONATATE 100 MG CAPSULE
100.0000 mg | ORAL_CAPSULE | Freq: Three times a day (TID) | ORAL | Status: DC | PRN
Start: 2021-01-26 — End: 2021-01-27

## 2021-01-26 MED ORDER — FENTANYL (PF) 50 MCG/ML INJECTION SOLUTION
INTRAMUSCULAR | Status: AC
Start: 2021-01-26 — End: 2021-01-26
  Filled 2021-01-26: qty 2

## 2021-01-26 MED ORDER — MIDAZOLAM 1 MG/ML INJECTION WRAPPER
INTRAMUSCULAR | Status: AC
Start: 2021-01-26 — End: 2021-01-26
  Filled 2021-01-26: qty 2

## 2021-01-26 MED ORDER — ASPIRIN 1 MG/ML ORAL LIQUID
1.0000 mg | Freq: Once | ORAL | Status: AC
Start: 2021-01-26 — End: 2021-01-26
  Administered 2021-01-26: 09:00:00 1 mg via ORAL
  Filled 2021-01-26: qty 1

## 2021-01-26 MED ORDER — EPINEPHRINE 1 MG/ML (1 ML) INJECTION SOLUTION
0.3000 mg | Freq: Once | INTRAMUSCULAR | Status: DC | PRN
Start: 2021-01-26 — End: 2021-01-27
  Filled 2021-01-26: qty 0.3

## 2021-01-26 MED ORDER — DIPHENHYDRAMINE 50 MG/ML INJECTION SOLUTION
25.0000 mg | Freq: Four times a day (QID) | INTRAMUSCULAR | Status: DC | PRN
Start: 2021-01-26 — End: 2021-01-27

## 2021-01-26 MED ORDER — IODIXANOL 320 MG IODINE/ML INTRAVENOUS SOLUTION
Freq: Once | INTRAVENOUS | Status: DC | PRN
Start: 2021-01-26 — End: 2021-01-26
  Administered 2021-01-26: 115 mL via INTRACORONARY

## 2021-01-26 MED ORDER — IODIXANOL 320 MG IODINE/ML INTRAVENOUS SOLUTION
INTRAVENOUS | Status: AC
Start: 2021-01-26 — End: 2021-01-26
  Filled 2021-01-26: qty 100

## 2021-01-26 MED ORDER — FENTANYL (PF) 50 MCG/ML INJECTION SOLUTION
Freq: Once | INTRAMUSCULAR | Status: DC | PRN
Start: 2021-01-26 — End: 2021-01-26
  Administered 2021-01-26: 50 ug via INTRAVENOUS

## 2021-01-26 MED ORDER — HEPARIN (PORCINE) 1,000 UNIT/ML INJECTION SOLUTION
Freq: Once | INTRAMUSCULAR | Status: DC | PRN
Start: 2021-01-26 — End: 2021-01-26
  Administered 2021-01-26: 5000 [IU] via INTRA_ARTERIAL
  Administered 2021-01-26: 4000 [IU] via INTRAVENOUS

## 2021-01-26 SURGICAL SUPPLY — 27 items
ANGIO NAMIC PROTECT 72IN WASTE COLLECT FEMALE FIT STRAP CLIP CLS FLUID SYSTEM STRL LF (IV TUBING & ACCESSORIES) ×2 IMPLANT
ANGIO NAMIC PROTECT STTN PRESS_URIZE M RA ADPR SYSTEM STRL (IV TUBING & ACCESSORIES) ×1
CATH ANGIO 5FR RADIAL TIG 4 CURVE 100CM OPTITORQUE LRG LUM SH 2 BRD SFT TIP COR SS NYL POLYUR (CATHETERS) ×2
CATH ANGIO 5FR RADL TIG 4 CURV_E 100CM OPTITORQUE LRG LUM SH (CATHETERS) ×1
CATH EMERGE MNRL WH 2.5MM 12MM 144CM 2 LUM ULTRA LOW PROF RADOPQ BAL DIL OPTILEAP X ZGLIDE ACPT 6FR (BALLOON) ×2 IMPLANT
CATH EMERGE MNRL WH 2.5MM 12MM_144CM 2 LUM ULTRA LO PRFL (BALLOON) ×1
CATH GD 6FR 100CM EBU3.5 CURVE LNCHR SH LRG LUM RADOPQ FLXB DIST SEG COR NYL STRL LF (CATHETERS) ×2 IMPLANT
CATH GD 6FR 100CM EBU3.5 CURVE_LNCHR LRG LUM SH RADOPQ FLXB (CATHETERS) ×1
CATH US EE PLAT 3.3-2.9FR 20MM 150CM 24CM 20MHZ RX LUM RADOPQ TAPER TIP GLYDX ACPT .014IN GW TRKBK (CATHETERS) ×2 IMPLANT
CATH US EE PLAT 3.3-2.9FR 20MM_150CM 24CM 20MHZ RX LUM (CATHETERS) ×2
CONV USE 338564 - PACK SURG ANGIO PRX STRL DISP LF (CUSTOM TRAYS & PACK) ×2 IMPLANT
COR STENT SYNERGY XD MNRL 3MM 12MM 144CM DEL SYS 1 ACCESS PORT RADOPQ INFL LUM EVEROLIMUS PLAT CR (STENTS CORONARY) ×2 IMPLANT
DEVICE COMPRESS TR BAND 24CM 2 BAL TRNSPR ADJ STRAP REG RADIAL ART (MED SURG SUPPLIES) ×2 IMPLANT
DEVICE COMPRESS TR BAND 24CM 2_BAL TRNSPR ADJ STRAP AIR TRTN (MED SURG SUPPLIES) ×1
DEVICE INFLAT INDFLTR 35.5CM DGT 25 ATM ANGPLSTY (ENDOSCOPIC SUPPLIES) ×2 IMPLANT
DEVICE INFLAT INDFLTR 35.5CM D_GT 25 ATM ANGPLSTY (INSTRUMENTS ENDOMECHANICAL) ×1
GW RUNTHROUGH .36MM 3CM 180CM RADOPQ X FLPY VAS STRL LF  DISP PTCA (WIRE) ×2 IMPLANT
GW RUNTHROUGH NS .014IN 3CM 18_CM ATRM X FLPY TIP RADOPQ (WIRE) ×2
KIT INTROD 10CM 6FR 20GA GLIDESHEATH SLNDR .025IN 32MM SHORT ANG PLASTIC HDRPH SHEATH DIL 2 WL PNCT (INTRODUCER) ×2 IMPLANT
KIT INTROD 10CM 6FR 20GA GLIDE_SHEATH SLNDR .025IN 32MM SHORT (INTRODUCER) ×1
KIT MANIFLD SAL CNRST PORT SPIKE TUBE HAND SYRG PRESS TRANSDUC CART ACIST CVI DISP (IV TUBING & ACCESSORIES) ×2 IMPLANT
KIT MNFLD SAL CNRST PORT SPIKE_TUBE HAND SYRG PRESS TRANSDUC (IV TUBING & ACCESSORIES) ×1
KIT SYRG ACIST CVI AUTOMATE MU_A2000 REUSE (MED SURG SUPPLIES) ×1
KIT SYRG AUTOMATE MU A2000 REUSE (MED SURG SUPPLIES) ×2 IMPLANT
PACK ANGIOGRAPHY_CS/5 (CUSTOM TRAYS & PACK) ×1
SHIELD RAD 16X14IN DRP ARMOUR PAD (MED SURG SUPPLIES) ×2
SHIELD RAD 16X14IN DRP ARMOUR_PAD (MED SURG SUPPLIES) ×2

## 2021-01-26 NOTE — Progress Notes (Signed)
IP PROGRESS NOTE      [name], [name]  Date of Admission:  01/22/2021  Date of Birth:  04/25/1940  Date of Service:  01/26/2021    Chief Complaint:   Currently patient is being desensitized to aspirin.  LHC this noon     Subjective:  Patient seen and examined follow-up for NSTEMI with history of CAD and PCI history of hyperlipidemia  Currently being desensitized to aspirin    Problem List:  Active Hospital Problems   (*Primary Problem)    Diagnosis    *Chest pain    Hypomagnesemia    Type 1 non-ST elevation myocardial infarction (NSTEMI) (CMS HCC)     Added automatically from request for surgery [id number]      Hypertension    Dyslipidemia    CAD S/P multiple PCI to mid RCA in 2009 and 2011       Assessment/ Plan:    1. NSTEMI history of CAD PCI      Currently being desensitized with aspirin      Continue to monitor on continuous pulse ox      For LHC      Continue with heparin GTT Lipitor Plavix    2. Hypertension      Controlled continue with amlodipine Coreg lisinopril    3. Prediabetes      A1c 5.9      SSI     Code status:  Full  DVT proph:  Heparin GTT  Disposition pending LHC        Vital Signs:  Temp (24hrs) Max:36.8 C (98.2 F)      Temperature: 36.4 C (97.5 F)  BP (Non-Invasive): (!) 160/91  MAP (Non-Invasive): 103 mmHG  Heart Rate: 66  Respiratory Rate: 16  SpO2: 98 %      Objective     Current Medications:  acetaminophen (TYLENOL) tablet, 1,000 mg, Oral, Q6H PRN  albuterol (PROVENTIL) 2.5 mg / 3 mL (0.083%) neb solution, 2.5 mg, Nebulization, Q4H PRN  amLODIPine (NORVASC) tablet, 5 mg, Oral, Daily  atorvastatin (LIPITOR) tablet, 80 mg, Oral, QPM  benzonatate (TESSALON) capsule, 100 mg, Oral, Q8H PRN  carvedilol (COREG) tablet, 6.25 mg, Oral, 2x/day-Food  clopidogrel (PLAVIX) 75 mg tablet, 75 mg, Oral, Daily  SSIP insulin lispro (HUMALOG) 100 units/mL SubQ pen, 1-5 Units, Subcutaneous, 4x/day AC   And  dextrose 50% (0.5 g/mL) injection - syringe, 12.5 g, Intravenous, Q15 Min PRN  diphenhydrAMINE  (BENADRYL) 50 mg/mL injection, 25 mg, Intravenous, Q6H PRN  EPINEPHrine (ADRENALIN) 1 mg/mL injection, 0.3 mg, IntraMUSCULAR, Once PRN  famotidine (PEPCID) 10 mg/mL injection, 20 mg, Intravenous, Q12H PRN  heparin 25,000 units in 0.45% NS 250 mL infusion, 12 Units/kg/hr (Adjusted), Intravenous, Continuous  ipratropium-albuterol 0.5 mg-3 mg(2.5 mg base)/3 mL Solution for Nebulization, 3 mL, Nebulization, Q4H PRN  lisinopril (PRINIVIL) tablet, 20 mg, Oral, Daily  methylPREDNISolone sod succ (SOLU-MEDROL) 40 mg/mL injection, 40 mg, Intravenous, Once PRN  morphine 4 mg/mL injection, 4 mg, Intravenous, Q5 Min PRN  nitroGLYCERIN (NITROSTAT) sublingual tablet, 0.4 mg, Sublingual, Q5 Min PRN  NS 250 mL flush bag, , Intravenous, Q1H PRN  NS flush syringe, 10 mL, Intravenous, Q8HRS  NS flush syringe, 10 mL, Intravenous, Q1H PRN  prochlorperazine (COMPAZINE) 5 mg/mL injection, 10 mg, Intravenous, Q6H PRN        Today's Physical Exam:  General: appears in good health. No distress.   Eyes: Pupils equal and round, reactive to light and accomodation.   HENT:Head atraumatic and normocephalic  Neck: No JVD or thyromegaly or lymphadenopathy   Lungs: CTAB, non labored breathing, no rales or wheezing.    Cardiovascular: regular rate and rhythm, S1, S2 normal, no murmur,   Abdomen: Soft, non-tender, Bowel sounds normal, No hepatosplenomegaly   Extremities: extremities normal, atraumatic, no cyanosis or edema   Skin: Skin warm and dry   Neurologic: Grossly normal   Psychiatric: Normal affect, behavior,         I/O:  I/O last 24 hours:      Intake/Output Summary (Last 24 hours) at 01/26/2021 1438  Last data filed at 01/26/2021 0600  Gross per 24 hour   Intake 240 ml   Output 550 ml   Net -310 ml     I/O current shift:  No intake/output data recorded.      Labs  Please indicate ordered or reviewed)  Reviewed: I have reviewed all lab results.    CBC (Last 48 Hours):    Recent Results in last 48 hours     01/26/21  0428   WBC 13.1*   HGB  11.4*   HCT 35.1*   MCV 90.9   PLTCNT 147*        BMP (Last 24 Hours):    Recent Results last 24 hours     01/26/21  0428   SODIUM 141   POTASSIUM 4.3   CHLORIDE 110   CO2 25   BUN 25   CREATININE 0.96   CALCIUM 8.7*   GLUCOSENF 124           Radiology Tests (Please indicate ordered or reviewed)  Reviewed: personally reviewed all available images    Medical decision making:  All pertinent labs were personally reviewed with additional follow-up lab work ordered as deemed clinically appropriate.  All available imaging results and EKGs were reviewed and independently interpreted.  Pertinent previous medical record results were reviewed.  Further workup and treatment depending on clinical cours      Portions of this note may be dictated using voice recognition software or a dictation service. Variances in spelling and vocabulary are possible and unintentional. Not all errors are caught/corrected. Please notify the Thereasa Parkin if any discrepancies are noted or if the meaning of any statement is not clear.

## 2021-01-26 NOTE — Care Management Notes (Signed)
Met with patient by bedside. Pt lives  with his spouse in a mobile home with 1 STE. He occasionally uses a cane otherwise he does not use any h ome DME. Prior to this admission pt states he drives and is independent of ADL's. Has [employer] Medicare. Uses [employer] mail order pharmacy or [facility] in [place] for medications and is able to afford them. PCP is Dr. Valora Corporal which he sees every 6 months. Next appt is 02-09-21. Pt states he has good family support. Address/contacts confirmed.    Pt is to get a LH catheterization today.     Plan: home    IMM was completed by registration 01-23-21 1250    IP status began 01-23-21 @ 1236     01/26/21 1115   Assessment Detail   Assessment Type Admission   Date of Care Management Update 01/26/21   Readmission   Is this a readmission? No   Medicare Intent to Discharge Documentation   Admit IMM given to:   (Completed by registration 01-23-21 @ 1250)   Social Work Plan   Discharge Planning Status initial meeting   Anticipated Discharge Disposition Home   Discharge Needs Assessment   Concerns To Be Addressed denies needs/concerns at this time   Readmission Within the Last 30 Days no previous admission in last 30 days   Taking blood thinner/anticoagulant therapy at home? other (see comments)  (plavix)   Equipment Currently Used at Home cane, straight   Currently on Outpatient Dialysis? No   Transportation Available family or friend will provide   Referral Information   Admission Type inpatient  (01-23-21 @ 1236)   Arrived From home or self-care   Address Verified verified-no changes   Insurance Verified verified-no change   Source of Information Patient

## 2021-01-26 NOTE — Nurses Notes (Signed)
POC reviewed with pt/family. A/Ox4. Denies pain/CP/SOB. Aspirin desensitization administered. No signs or symptoms of adverse/anaphylactic reactions. VSS. Admit back form cathlab with right radial site with 12cc of air per cath lab RN. No signs of hematoma or bleeding noted. Extremity pulses all palpable. VSS. Bradycardic; MD notified of cored held. Assisted with ADLs appropriately. Safety and falls precaution in place. Will continue to monitor pt needs.

## 2021-01-26 NOTE — Consults (Signed)
[facility]  Interventional Cardiology Consultation Note    Date Time: 01/26/21 14:56  Patient Name: [name]  MRN#: [id number]  DOB: 28-May-1940  Consulting Physician: Daryl Eastern, MD    Reason for Consult:   Reason for consult: The patient was seen at the request of Daryl Eastern, MD for the evaluation of NSTEMI.    History:   [name] is a 80 y.o. male with PMH of Essential Hypertension, Hyperlipidemia, CAD s/p multiple PCI to mid RCA in 2009 and 2011, who presented for acute onset CP, found to have NSTEMI by troponin.    PMH: Essential Hypertension, Hyperlipidemia, CAD s/p multiple PCI to mid RCA in 2009 and 2011  PSH: PCI   SH: Former smoker, quit 5-6 years ago, No Alcohol and No IVDA  FH: Negative for premature CAD and sudden death    He had a questionable aspirin allergy and underwent desensitization without issue.  He is chronically on Plavix.    Past Medical History:     Past Medical History:   Diagnosis Date    Coronary artery disease     High cholesterol     HTN (hypertension)     Wears glasses            Past Surgical History:     Past Surgical History:   Procedure Laterality Date    HX CORONARY ARTERY BYPASS GRAFT             Problem List:   Principal Problem:    Chest pain  Active Problems:    Hypertension    Dyslipidemia    CAD S/P multiple PCI to mid RCA in 2009 and 2011    Type 1 non-ST elevation myocardial infarction (NSTEMI) (CMS HCC)    Hypomagnesemia      Allergies:     Allergies   Allergen Reactions    Aspirin Hives/ Urticaria       Medications:     Prior to Admission medications    Medication Sig Start Date End Date Taking? Authorizing Provider   amLODIPine (NORVASC) 5 mg Oral Tablet Take 1 Tablet (5 mg total) by mouth 02/14/20  Yes Provider, Historical   azithromycin (ZITHROMAX) 250 mg Oral Tablet 2 Po on day 1 and 1 PO day 2-5 01/21/21 01/25/21  Provider, Historical   carvedilol (COREG) 12.5 mg Oral Tablet Take 12.5 mg by mouth Twice daily with food   Yes  Provider, Historical   clopidogrel (PLAVIX) 75 mg Oral Tablet Take 75 mg by mouth Once a day   Yes Provider, Historical   lisinopril (PRINIVIL) 20 mg Oral Tablet Take 20 mg by mouth Once a day   Yes Provider, Historical   predniSONE (DELTASONE) 20 mg Oral Tablet 3 PO daily for 3 days, 2 PO daily for 3 days, 1 PO daily for 3 days 01/21/21  Yes Provider, Historical   simvastatin (ZOCOR) 40 mg Oral Tablet Take 40 mg by mouth Every evening    Provider, Historical     Current Facility-Administered Medications   Medication Dose Route Frequency    acetaminophen (TYLENOL) tablet  1,000 mg Oral Q6H PRN    albuterol (PROVENTIL) 2.5 mg / 3 mL (0.083%) neb solution  2.5 mg Nebulization Q4H PRN    amLODIPine (NORVASC) tablet  5 mg Oral Daily    atorvastatin (LIPITOR) tablet  80 mg Oral QPM    benzonatate (TESSALON) capsule  100 mg Oral Q8H PRN    carvedilol (COREG) tablet  6.25 mg Oral 2x/day-Food    clopidogrel (PLAVIX) 75 mg tablet  75 mg Oral Daily    SSIP insulin lispro (HUMALOG) 100 units/mL SubQ pen  1-5 Units Subcutaneous 4x/day AC    And    dextrose 50% (0.5 g/mL) injection - syringe  12.5 g Intravenous Q15 Min PRN    diphenhydrAMINE (BENADRYL) 50 mg/mL injection  25 mg Intravenous Q6H PRN    EPINEPHrine (ADRENALIN) 1 mg/mL injection  0.3 mg IntraMUSCULAR Once PRN    famotidine (PEPCID) 10 mg/mL injection  20 mg Intravenous Q12H PRN    heparin 25,000 units in 0.45% NS 250 mL infusion  12 Units/kg/hr (Adjusted) Intravenous Continuous    ipratropium-albuterol 0.5 mg-3 mg(2.5 mg base)/3 mL Solution for Nebulization  3 mL Nebulization Q4H PRN    lisinopril (PRINIVIL) tablet  20 mg Oral Daily    methylPREDNISolone sod succ (SOLU-MEDROL) 40 mg/mL injection  40 mg Intravenous Once PRN    morphine 4 mg/mL injection  4 mg Intravenous Q5 Min PRN    nitroGLYCERIN (NITROSTAT) sublingual tablet  0.4 mg Sublingual Q5 Min PRN    NS 250 mL flush bag   Intravenous Q1H PRN    NS flush syringe  10 mL Intravenous Q8HRS     NS flush syringe  10 mL Intravenous Q1H PRN    prochlorperazine (COMPAZINE) 5 mg/mL injection  10 mg Intravenous Q6H PRN       Family History:     Family Medical History:    None           Social History:     Social History     Socioeconomic History    Marital status: Married     Spouse name: Not on file    Number of children: Not on file    Years of education: Not on file    Highest education level: Not on file   Occupational History    Not on file   Tobacco Use    Smoking status: Every Day     Packs/day: 1.00     Types: Cigarettes    Smokeless tobacco: Never   Substance and Sexual Activity    Alcohol use: No    Drug use: No    Sexual activity: Not on file   Other Topics Concern    Not on file   Social History Narrative    Not on file     Social Determinants of Health     Financial Resource Strain: Not on file   Food Insecurity: Not on file   Transportation Needs: Not on file   Physical Activity: Not on file   Stress: Not on file   Intimate Partner Violence: Not on file   Housing Stability: Not on file       Review of Systems:     All systems reviewed and negative other than those noted in the HPI.    Physical Exam:     Filed Vitals:    01/26/21 1255 01/26/21 1325 01/26/21 1355 01/26/21 1422   BP: (!) 155/61 (!) 165/75 (!) 161/69 (!) 160/91   Pulse: 61 61 61 66   Resp: 16 16 16 16    Temp: 36.7 C (98 F) 36.4 C (97.6 F) 36.4 C (97.5 F) 36.4 C (97.5 F)   SpO2: 97% 98% 98% 98%       Intake and Output Summary (Last 24 hours) at Date Time    Intake/Output Summary (Last 24 hours) at 01/26/2021 1456  Last data  filed at 01/26/2021 0600  Gross per 24 hour   Intake 240 ml   Output 550 ml   Net -310 ml       General/Constitutional: no acute distress  HEENT: moist mucous membranes  Neck: supple, no JVD  Heart/Cardiovascular:   RRR  no murmurs/rubs/gallops  2+ radial pulses  Respiratory/Lungs: non-labored, clear to auscultation throughout  Abdominal/Gastrointestinal: soft, nontender  Extremities: warm and  well-perfused, no edema x4  Skin: no rash  Neurological: alert and oriented x3, grossly nonfocal  Psych: calm      Labs Reviewed:   Labs reviewed     Cardiovascular Workup:   ECG unremarkable  TTE low normal EF    Assessment:   NSTEMI    Recommendations:     I discussed the risks, benefits, and alternatives of undergoing cardiac catheterization, coronary angiography, and percutaneous coronary intervention, including the small risks of heart attack, stroke, bleeding, vascular injury, allergic reaction, cardiac arrest, death, cardiac arrhythmias, and contrast nephropathy leading to possible dialysis.  Overall, I feel the benefits outweigh the risks.  The patient elected to proceed and consent was signed.  The patient was given the opportunity to ask questions in a non-pressured setting and all questions were answered.  ASA 3  Mallampati 2      Thank you for consulting [facility] and allowing [place] to participate in this pt's care!     Thank you for allowing me to participate in the care of your patient. Please feel free to contact me if there are further questions.     Lenda Kelp, MD    Lenda Kelp, MD  01/26/2021, 14:56

## 2021-01-26 NOTE — Nurses Notes (Addendum)
12cc of air in TR band per cath lab RN.  1840: 3cc of air taken out. No signs of hematoma or bleeding noted.  1855: Noted a small amount of blood leaking from TR band. 1cc of air instilled per protocol order into balloon. No hematoma noted forming around puncture site. Will keep monitoring signs of bleeding.    Christina night RN at bedside with this RN to view TR band and instillation of 1cc of air into TR band and will wait another 15 min to reassess if air can be taken out.    A total of 10cc of air left in TR band.

## 2021-01-26 NOTE — Nurses Notes (Signed)
Pt transported, with continuous vitals monitoring, from rm 226 by cathlab RNs. Pt aaox4, 57sb, rr 14, spo2 99% RA, nibp 197/80(109). No CP reported. No hx cardiac bypass as reported by pt. No bypass scars noted. bilat radial pulses 2+, bilat pedal puses 1+. Barbeau test passed. PIV patent. No gi/gu complaints. Bilateral groin shaved and rt wrist shaved.pt being consented by plitt md.

## 2021-01-26 NOTE — Nurses Notes (Addendum)
0600:  Message sent to Dr. Bing Quarry.  Patient had 30 beats of VT per monitor Tech.  He is asymptotic. On heparin drip.  Please advise.  0602:  MD. Messaged back to continue to observe.    Patient has been stable over night, continued on Heparin drip.  No bleeding/no adverse effect.    V/S stable.  Will continue to monitor.

## 2021-01-26 NOTE — Nurses Notes (Addendum)
1925: 3cc of air removed, with small dry blood notice under TR band.  1945: small fresh red blood under TR band, 3cc replaced, 1 ml at a time. Will monitor.    2000:  No air removed from TR band at this time.  Will monitor.  2015:  3cc of air removed.  Will monitor.  No new bleeding at site.  3 cc removed.    2300:  TR band removed.  No new bleeding, No bruising, and no hematoma.  Patient reminded to avoid using his right hand for tonight.  Pt is in stable condition.  Will continue to monitor.

## 2021-01-27 LAB — ECG 12-LEAD
Atrial Rate: 54 {beats}/min
Calculated P Axis: 40 degrees
Calculated P Axis: 50 degrees
PR Interval: 140 ms
QRS Duration: 112 ms
QRS Duration: 100 ms
QT Interval: 432 ms
Ventricular rate: 54 {beats}/min

## 2021-01-27 LAB — POC FINGERSTICK GLUCOSE - BMC/JMC (RESULTS)
GLUCOSE, POC: 99 mg/dl (ref 60–100)
GLUCOSE, POC: 88 mg/dl (ref 60–100)

## 2021-01-27 MED ORDER — ATORVASTATIN 80 MG TABLET
80.0000 mg | ORAL_TABLET | Freq: Every evening | ORAL | 0 refills | Status: DC
Start: 2021-01-27 — End: 2021-01-27

## 2021-01-27 MED ORDER — LISINOPRIL 20 MG TABLET
40.0000 mg | ORAL_TABLET | Freq: Every day | ORAL | 0 refills | Status: AC
Start: 2021-01-27 — End: 2022-12-16

## 2021-01-27 MED ORDER — TICAGRELOR 90 MG TABLET
90.0000 mg | ORAL_TABLET | Freq: Two times a day (BID) | ORAL | 0 refills | Status: AC
Start: 2021-01-27 — End: 2021-04-27

## 2021-01-27 MED ORDER — ASPIRIN 81 MG CHEWABLE TABLET
81.0000 mg | CHEWABLE_TABLET | Freq: Every day | ORAL | 0 refills | Status: DC
Start: 2021-01-28 — End: 2021-01-27

## 2021-01-27 MED ORDER — LABETALOL 100 MG TABLET
100.0000 mg | ORAL_TABLET | Freq: Two times a day (BID) | ORAL | 0 refills | Status: AC
Start: 2021-01-28 — End: 2021-04-28

## 2021-01-27 MED ORDER — LABETALOL 100 MG TABLET
100.0000 mg | ORAL_TABLET | Freq: Two times a day (BID) | ORAL | Status: DC
Start: 2021-01-27 — End: 2021-01-27
  Administered 2021-01-27: 13:00:00 100 mg via ORAL
  Filled 2021-01-27: qty 1

## 2021-01-27 MED ORDER — LABETALOL 100 MG TABLET
100.0000 mg | ORAL_TABLET | Freq: Two times a day (BID) | ORAL | 0 refills | Status: DC
Start: 2021-01-28 — End: 2021-01-27

## 2021-01-27 MED ORDER — LISINOPRIL 40 MG TABLET
40.0000 mg | ORAL_TABLET | Freq: Every day | ORAL | Status: DC
Start: 2021-01-28 — End: 2021-01-27

## 2021-01-27 MED ORDER — NITROGLYCERIN 0.4 MG SUBLINGUAL TABLET
0.4000 mg | SUBLINGUAL_TABLET | SUBLINGUAL | 0 refills | Status: DC | PRN
Start: 2021-01-27 — End: 2021-01-27

## 2021-01-27 MED ORDER — ATORVASTATIN 80 MG TABLET
80.0000 mg | ORAL_TABLET | Freq: Every evening | ORAL | 0 refills | Status: AC
Start: 2021-01-27 — End: 2022-12-16

## 2021-01-27 MED ORDER — BENZONATATE 100 MG CAPSULE
100.0000 mg | ORAL_CAPSULE | Freq: Three times a day (TID) | ORAL | 0 refills | Status: DC | PRN
Start: 2021-01-27 — End: 2021-01-27

## 2021-01-27 MED ORDER — TICAGRELOR 90 MG TABLET
90.0000 mg | ORAL_TABLET | Freq: Two times a day (BID) | ORAL | 0 refills | Status: DC
Start: 2021-01-27 — End: 2021-01-27

## 2021-01-27 MED ORDER — BENZONATATE 100 MG CAPSULE
100.0000 mg | ORAL_CAPSULE | Freq: Three times a day (TID) | ORAL | 0 refills | Status: AC | PRN
Start: 2021-01-27 — End: ?

## 2021-01-27 MED ORDER — ASPIRIN 81 MG CHEWABLE TABLET
81.0000 mg | CHEWABLE_TABLET | Freq: Every day | ORAL | 0 refills | Status: AC
Start: 2021-01-28 — End: 2021-04-28

## 2021-01-27 MED ORDER — NITROGLYCERIN 0.4 MG SUBLINGUAL TABLET
0.4000 mg | SUBLINGUAL_TABLET | SUBLINGUAL | 0 refills | Status: AC | PRN
Start: 2021-01-27 — End: ?

## 2021-01-27 NOTE — Progress Notes (Signed)
[facility]  Progress Note    Date Time: 01/27/2021 10:39  Patient Name: [name]    Subjective:   Denies chest pain, SOB, palpitations         Physical Exam:     Filed Vitals:    01/26/21 2100 01/26/21 2300 01/27/21 0500 01/27/21 0813   BP: (!) 166/69 (!) 157/56 (!) 141/67 (!) 155/87   Pulse: 61 61 57 63   Resp: 16 16 16 18    Temp: 36.6 C (97.9 F) 36.2 C (97.2 F) 35.7 C (96.2 F) 36.9 C (98.5 F)   SpO2: 99% 98% 97% 99%     Temp (24hrs), Avg:36.4 C (97.6 F), Min:35.7 C (96.2 F), Max:36.9 C (98.5 F)      Intake/Output Summary (Last 24 hours) at 01/27/2021 1039  Last data filed at 01/27/2021 1000  Gross per 24 hour   Intake 1160 ml   Output 350 ml   Net 810 ml       General/Constitutional: no acute distress  HEENT: moist mucous membranes  Neck: supple, no JVD  Heart/Cardiovascular:   RRR  no murmurs/rubs/gallops  2+ radial pulses  2+ DP pulses  2+ PT pulses  No bruits throughout  Respiratory/Lungs: non-labored, clear to auscultation throughout  Abdominal/Gastrointestinal: soft, nontender  Extremities: warm and well-perfused, no edema x4  Skin: no rash  Neurological: alert and oriented x3, grossly nonfocal  Psych: calm    Medications:     Current Facility-Administered Medications   Medication Dose Route Frequency   . acetaminophen (TYLENOL) tablet  1,000 mg Oral Q6H PRN   . albuterol (PROVENTIL) 2.5 mg / 3 mL (0.083%) neb solution  2.5 mg Nebulization Q4H PRN   . amLODIPine (NORVASC) tablet  5 mg Oral Daily   . aspirin chewable tablet 81 mg  81 mg Oral Daily   . atorvastatin (LIPITOR) tablet  80 mg Oral QPM   . benzonatate (TESSALON) capsule  100 mg Oral Q8H PRN   . carvedilol (COREG) tablet  6.25 mg Oral 2x/day-Food   . SSIP insulin lispro (HUMALOG) 100 units/mL SubQ pen  1-5 Units Subcutaneous 4x/day AC    And   . dextrose 50% (0.5 g/mL) injection - syringe  12.5 g Intravenous Q15 Min PRN   . diphenhydrAMINE (BENADRYL) 50 mg/mL injection  25 mg Intravenous Q6H PRN   . EPINEPHrine  (ADRENALIN) 1 mg/mL injection  0.3 mg IntraMUSCULAR Once PRN   . famotidine (PEPCID) 10 mg/mL injection  20 mg Intravenous Q12H PRN   . ipratropium-albuterol 0.5 mg-3 mg(2.5 mg base)/3 mL Solution for Nebulization  3 mL Nebulization Q4H PRN   . lisinopril (PRINIVIL) tablet  20 mg Oral Daily   . methylPREDNISolone sod succ (SOLU-MEDROL) 40 mg/mL injection  40 mg Intravenous Once PRN   . morphine 4 mg/mL injection  4 mg Intravenous Q5 Min PRN   . nitroGLYCERIN (NITROSTAT) sublingual tablet  0.4 mg Sublingual Q5 Min PRN   . NS 250 mL flush bag   Intravenous Q1H PRN   . NS flush syringe  10 mL Intravenous Q8HRS   . NS flush syringe  10 mL Intravenous Q1H PRN   . prochlorperazine (COMPAZINE) 5 mg/mL injection  10 mg Intravenous Q6H PRN   . ticagrelor (BRILINTA) tablet  90 mg Oral 2x/day     Prior to Admission medications    Medication Sig Start Date End Date Taking? Authorizing Provider   amLODIPine (NORVASC) 5 mg Oral Tablet Take 1 Tablet (5  mg total) by mouth 02/14/20  Yes Provider, Historical   azithromycin (ZITHROMAX) 250 mg Oral Tablet 2 Po on day 1 and 1 PO day 2-5 01/21/21 01/25/21  Provider, Historical   carvedilol (COREG) 12.5 mg Oral Tablet Take 12.5 mg by mouth Twice daily with food   Yes Provider, Historical   clopidogrel (PLAVIX) 75 mg Oral Tablet Take 75 mg by mouth Once a day   Yes Provider, Historical   lisinopril (PRINIVIL) 20 mg Oral Tablet Take 20 mg by mouth Once a day   Yes Provider, Historical   predniSONE (DELTASONE) 20 mg Oral Tablet 3 PO daily for 3 days, 2 PO daily for 3 days, 1 PO daily for 3 days 01/21/21  Yes Provider, Historical   simvastatin (ZOCOR) 40 mg Oral Tablet Take 40 mg by mouth Every evening    Provider, Historical           Labs:     BMP:   Recent Labs     01/24/21  0316 01/26/21  0428   SODIUM 142 141   POTASSIUM 4.3 4.3   CO2 24 25   BUN 24 25   CREATININE 0.99 0.96     Magnesium:   Recent Labs     01/26/21  0428   MAGNESIUM 1.8     CBC:   Recent Labs     01/24/21  0316  01/26/21  0428   WBC 12.0* 13.1*   HGB 12.2* 11.4*   HCT 37.6* 35.1*   PLTCNT 160 147*     Hepatic Function: No results found for this encounter  Coags:   Recent Labs     01/23/21  1720 01/23/21  2327 01/24/21  0809 01/24/21  1552 01/24/21  2207 01/25/21  0459 01/25/21  0623 01/25/21  1057 01/25/21  1644 01/25/21  1830 01/26/21  0428 01/26/21  1327   APTT 50.5* 48.1* 54.7* 40.6* 65.4* 52.0* 59.2* 67.1* 70.1* 63.7* 86.0* 61.7*     Cardiac Markers: No results found for this encounter    Invalid input(s): UHCEASTTROPI*  TSH:  No results found for this encounter  Lipids:   Recent Results (from the past 27741 hour(s))   LIPID PANEL    Collection Time: 01/22/21 10:21 PM   Result Value    CHOLESTEROL  174    HDL CHOL 48 (L)    TRIGLYCERIDES 88    LDL CALC 108 (H)    VLDL CALC 18    NON-HDL 126    CHOL/HDL RATIO 3.6       Problem List:   Principal Problem:    Chest pain  Active Problems:    Hypertension    Dyslipidemia    CAD S/P multiple PCI to mid RCA in 2009 and 2011    Type 1 non-ST elevation myocardial infarction (NSTEMI) (CMS HCC)    Hypomagnesemia       Cardiovascular Workup:     ECHO:01/23/2021    Conclusions:  Technically difficult study due to limited acoustic windows.  Left ventricular systolic function is normal.  The left ventricular ejection fraction by visual assessment is estimated to be 50-55%.  Findings  Left Ventricle: Normal left ventricular size. Normal geometry. Left ventricular systolic function is normal. The left ventricular ejection  fraction by visual assessment is estimated to be 50-55%. No segmental/regional wall motion abnormalities identified.  Right Ventricle: The right ventricle is not well visualized. Right ventricular systolic function cannot be assessed.  Left Atrium: The left atrium is normal in size.  Right Atrium: The right atrium is not well visualized.  Mitral Valve: Mitral valve leaflets appear mildly thickened. 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 There is mild mitral  regurgitation.  Tricuspid Valve: The tricuspid valve is not well visualized. Trace tricuspid regurgitation present.  Aortic Valve: The aortic valve is mildly calcified. No Aortic valve stenosis. There is no evidence of aortic regurgitation.  Pulmonic Valve: The pulmonic valve is not well visualized. No significant pulmonic valve regurgitation present.  Atrial Septum: The interatrial septum is normal in appearance.  IVC/Hepatic Veins: Normal IVC size with >50% inspiratory collapse (estimated RA pressure 3 mmHg).  Aorta: The aortic root is of normal size.  Pericardium/Pleural space: Normal pericardium with no pericardial effusion.    LHC/PCI LCx 01/26/21:  Successful PCI to an 80% mid left circumflex stenosis with a 3 x 12 mm Synergy DES.  RCA with severe diffuse in-stent restenosis and distal RCA occlusion with collaterals that appears chronic.  Remainder of his CAD is moderate and diffuse.        Assessment:     [name] WENNBERG is a 80 y.o. male with    1. NSTEMI:  -Likely Type I  -Hx of multiple PCI to mid RCA 2009 and 2011   - s/p stent to LCx 01/26/21  2. Essential Hypertension:  3. Hyperlipidemia:  -Atorvastatin 80mg  po qhs  4. Prediabetes:  -HgA1C 5.9%    Plan:     1. DAPT, AI, BB and high intensity statin; change Coreg to Labetalol and increase the Lisinopril and stop the Amlodipine.   2. SL NTG PRN  3. F/u in the office in 1-2 weeks        Brayton Caves, MD

## 2021-01-27 NOTE — Discharge Summary (Signed)
[facility]    DISCHARGE SUMMARY      PATIENT NAME:  [name], [name]  MRN:  [id number]  DOB:  08-10-1940    ENCOUNTER START DATE:  01/22/2021  INPATIENT ADMISSION DATE: 01/23/2021  DISCHARGE DATE:  01/27/2021    ATTENDING PHYSICIAN: Justice Rocher, MD  PRIMARY CARE PHYSICIAN: Andrey Campanile, MD     ADMISSION DIAGNOSIS: Chest pain  Chief Complaint   Patient presents with   . Chest Pain        DISCHARGE DIAGNOSIS:     Principal Problem:  Chest pain    Active Hospital Problems    Diagnosis Date Noted   . Principle Problem: Chest pain [R07.9] 01/22/2021   . Hypomagnesemia [E83.42] 01/24/2021   . Type 1 non-ST elevation myocardial infarction (NSTEMI) (CMS HCC) [I21.4] 01/22/2021   . Hypertension [I10] 01/22/2021   . Dyslipidemia [E78.5] 01/22/2021   . 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     Active Non-Hospital Problems    Diagnosis Date Noted   . Insomnia 01/22/2021   . IFG (impaired fasting glucose) 01/22/2021   . COPD (chronic obstructive pulmonary disease) (CMS HCC) 01/22/2021   . Atopic dermatitis, unspecified type 08/09/2018      Patient Active Problem List   Diagnosis Code   . Chest pain R07.9   . Insomnia G47.00   . IFG (impaired fasting glucose) R73.01   . Hypertension I10   . Dyslipidemia E78.5   . COPD (chronic obstructive pulmonary disease) (CMS HCC) J44.9   . Atopic dermatitis, unspecified type L20.9   . CAD S/P multiple PCI to mid RCA in 2009 and 2011 I25.10, Z98.61   . Type 1 non-ST elevation myocardial infarction (NSTEMI) (CMS HCC) I21.4   . Hypomagnesemia E83.42       Allergies   Allergen Reactions   . Aspirin Hives/ Urticaria     S/p desensitization 01/26/21 successful now tolerates            DISCHARGE MEDICATIONS:     Current Discharge Medication List      START taking these medications.      Details   aspirin 81 mg Tablet, Chewable  Start taking on: January 28, 2021   81 mg, Oral,  DAILY  Qty: 90 Tablet  Refills: 0     atorvastatin 80 mg Tablet  Commonly known as: LIPITOR  Replaces: simvastatin 40 mg Tablet   80 mg, Oral, EVERY EVENING  Qty: 90 Tablet  Refills: 0     benzonatate 100 mg Capsule  Commonly known as: TESSALON   100 mg, Oral, EVERY 8 HOURS PRN  Qty: 21 Capsule  Refills: 0     labetaloL 100 mg Tablet  Commonly known as: NORMODYNE  Start taking on: January 28, 2021   100 mg, Oral, EVERY 12 HOURS  Qty: 180 Tablet  Refills: 0     nitroGLYCERIN 0.4 mg Tablet, Sublingual  Commonly known as: NITROSTAT   0.4 mg, Sublingual, EVERY 5 MIN PRN, for 3 doses over 15 minutes  Qty: 21 Tablet  Refills: 0     ticagrelor 90 mg Tablet  Commonly known as: BRILINTA   90 mg, Oral, 2 TIMES DAILY  Qty: 180 Tablet  Refills: 0        CONTINUE these medications which have CHANGED during your visit.      Details  lisinopriL 20 mg Tablet  Commonly known as: PRINIVIL  What changed: how much to take   40 mg, Oral, DAILY  Qty: 60 Tablet  Refills: 0        STOP taking these medications.    amLODIPine 5 mg Tablet  Commonly known as: NORVASC     azithromycin 250 mg Tablet  Commonly known as: ZITHROMAX     carvediloL 12.5 mg Tablet  Commonly known as: COREG     clopidogreL 75 mg Tablet  Commonly known as: PLAVIX     predniSONE 20 mg Tablet  Commonly known as: DELTASONE     simvastatin 40 mg Tablet  Commonly known as: ZOCOR  Replaced by: atorvastatin 80 mg Tablet            DISCHARGE INSTRUCTIONS:      DISCHARGE INSTRUCTION - CARDIAC DIET     Diet: CARDIAC DIET      ASPIRIN ALREADY ORDERED     CARDIAC REHAB PHASE II - [facility]    Individualized education will be given based on the patient's diagnosis and needs.           Indication for Therapy PTCA/STENT    Indication for Therapy MYOCARDIAL INFARCTION    Method to Determine THR for Program Duration TO BE DETERMINED BY THE CARDIAC REHAB STAFF    Exercise Intensity or MET Level TO BE DETERMINED BY CARDIAC REHAB STAFF    Assign Risk Stratification (ACSM Guidelines)  INTERMEDIATE RISK       Follow-up Information     Plitt, Monia Sabal, MD Follow up in 2 week(s).    Specialties: INTERVENTIONAL CARDIOLOGY , CARDIOVASCULAR DISEASE, INTERNAL MEDICINE  Contact information:  [place]  [address] [place] [address]  [phone]             Andrey Campanile, MD Follow up in 1 week(s).    Specialty: FAMILY PRACTICE  Contact information:  [place] [address]  [phone]                         [place] COURSE:  This is a 80 y.o., male     1. NSTEMI history of CAD PCI       patient was desensitized with aspirin      Continue to monitor on continuous pulse ox      LHC Mid Clx stent placement       continue with aspirin Brilinta Lipitor labetalol    2. Hypertension      Controlled continue with amlodipine Coreg lisinopril    3. Prediabetes      A1c 5.9      SSI     Patient is being discharged home  Case Management will see the patient with regard to Brilinta coverage prior to discharge  Total discharge time 36 minutes  SIGNIFICANT PHYSICAL FINDINGS:   General: appears in good health. No distress.   Eyes: Pupils equal and round, reactive to light and accomodation.   HENT:Head atraumatic and normocephalic   Neck: No JVD or thyromegaly or lymphadenopathy   Lungs: CTAB, non labored breathing, no rales or wheezing.    Cardiovascular: regular rate and rhythm, S1, S2 normal, no murmur,   Abdomen: Soft, non-tender, Bowel sounds normal, No hepatosplenomegaly   Extremities: extremities normal, atraumatic, no cyanosis or edema   Skin: Skin warm and dry   Neurologic: Grossly normal   Psychiatric: Normal affect,  behavior,     SIGNIFICANT LAB:   SIGNIFICANT RADIOLOGY:   CONSULTATIONS:  Cardiology  PROCEDURES PERFORMED:  Left heart catheterization        COURSE IN HOSPITAL:      This is a 80 y.o., male     1. NSTEMI history of CAD PCI       patient was desensitized with aspirin      Continue to monitor on continuous pulse ox       LHC Mid Clx stent placement       continue with aspirin Brilinta Lipitor labetalol    2. Hypertension      Controlled continue with amlodipine Coreg lisinopril    3. Prediabetes      A1c 5.9      SSI     Patient is being discharged home  Case Management will see the patient with regard to Brilinta coverage prior to discharge  Total discharge time 36 minutes    DOES PATIENT HAVE ADVANCED DIRECTIVES:  No, Information Offered and Refused    ADVANCED CARE PLANNING - Not applicable for this patient    CONDITION ON DISCHARGE: Alert, Oriented and VS Stable    DISCHARGE DISPOSITION:  Home discharge     Copies sent to Care Team       Relationship Specialty Notifications Start End    Andrey Campanile, MD PCP - [place]  01/22/21     Phone: [phone] Fax: [phone]         [address] [place] [address]

## 2021-01-27 NOTE — Care Management Notes (Signed)
01/27/21 1300  IP CONSULT TO CARE MANAGEMENT ([facility] - DO NOT USE FOR [facility] PATIENTS)  ONE TIME     Complete  Discontinue     Process Instructions: If requesting assistance with completion of Advance Directives, please provide patient with the packet.   References:    ON CALL ([facility])   Provider: (Not yet assigned)   Question: Indication For Consult: Answer: D/C NEEDS - MEDICATION Comment: brillinta        Order received and acknowledged. Patient provided with a Brilinta One Month Free Card.

## 2021-01-27 NOTE — Care Management Notes (Signed)
01/27/21 1600   Medicare Intent to Discharge Documentation   Discharge IMM give to: Patient   Discharge IMM Letter Given Date 01/27/21   Discharge IMM Letter Given Time 1610   IMM explained/reviewed with:  Patient     I have reviewed the Medicare IM  with the patient he verbalized understanding.  I provided the number for [employer] @ [phone].  Pt given a copy, and original given to TIA ([employer]) to scan into epic.

## 2021-01-27 NOTE — Nurses Notes (Signed)
Patient discharged home with family.  AVS reviewed with patient/care giver.  A written copy of the AVS and discharge instructions was given to the patient/care giver.  Questions sufficiently answered as needed.  Patient/care giver encouraged to follow up with PCP as indicated.  In the event of an emergency, patient/care giver instructed to call 911 or go to the nearest emergency room.     Selinda Michaels, RN  01/27/2021, 18:25

## 2021-01-27 NOTE — Care Plan (Signed)
Problem: Adult Inpatient Plan of Care  Goal: Plan of Care Review  Outcome: Ongoing (see interventions/notes)  Goal: Patient-Specific Goal (Individualized)  Outcome: Ongoing (see interventions/notes)  Flowsheets (Taken 01/26/2021 2300)  Individualized Care Needs: frequent monitoring surgical site  Anxieties, Fears or Concerns: fear of surgical site bleeding  Goal: Absence of Hospital-Acquired Illness or Injury  Outcome: Ongoing (see interventions/notes)  Intervention: Identify and Manage Fall Risk  Recent Flowsheet Documentation  Taken 01/26/2021 2300 by Carlena Sax, RN  Safety Promotion/Fall Prevention: activity supervised  Intervention: Prevent Skin Injury  Recent Flowsheet Documentation  Taken 01/26/2021 2300 by Carlena Sax, RN  Skin Protection: adhesive use limited  Intervention: Prevent Infection  Recent Flowsheet Documentation  Taken 01/26/2021 2300 by Carlena Sax, RN  Infection Prevention: promote handwashing  Goal: Optimal Comfort and Wellbeing  Outcome: Ongoing (see interventions/notes)  Intervention: Provide Person-Centered Care  Recent Flowsheet Documentation  Taken 01/26/2021 2300 by Carlena Sax, RN  Trust Relationship/Rapport:   care explained   questions encouraged  Goal: Rounds/Family Conference  Outcome: Ongoing (see interventions/notes)

## 2021-01-28 ENCOUNTER — Telehealth (HOSPITAL_COMMUNITY): Payer: Self-pay | Admitting: Cardiovascular Disease

## 2021-01-29 ENCOUNTER — Ambulatory Visit (INDEPENDENT_AMBULATORY_CARE_PROVIDER_SITE_OTHER): Payer: Medicare PPO | Admitting: Family Medicine

## 2021-01-29 ENCOUNTER — Encounter (INDEPENDENT_AMBULATORY_CARE_PROVIDER_SITE_OTHER): Payer: Self-pay | Admitting: Family Medicine

## 2021-01-29 VITALS — BP 130/60 | HR 69 | Temp 98.1°F | Resp 19 | Ht 67.0 in | Wt 164.0 lb

## 2021-01-29 DIAGNOSIS — Z125 Encounter for screening for malignant neoplasm of prostate: Secondary | ICD-10-CM

## 2021-01-29 DIAGNOSIS — I2510 Atherosclerotic heart disease of native coronary artery without angina pectoris: Secondary | ICD-10-CM

## 2021-01-29 DIAGNOSIS — E785 Hyperlipidemia, unspecified: Secondary | ICD-10-CM

## 2021-01-29 DIAGNOSIS — I10 Essential (primary) hypertension: Secondary | ICD-10-CM

## 2021-01-29 DIAGNOSIS — I214 Non-ST elevation (NSTEMI) myocardial infarction: Secondary | ICD-10-CM

## 2021-01-29 DIAGNOSIS — J439 Emphysema, unspecified: Secondary | ICD-10-CM

## 2021-01-29 LAB — ECG 12-LEAD
Atrial Rate: 54 {beats}/min
Calculated R Axis: 35 degrees
Calculated R Axis: 13 degrees
PR Interval: 146 ms
QT Interval: 434 ms
QTC Calculation: 411 ms
QTC Calculation: 409 ms
Ventricular rate: 54 {beats}/min

## 2021-01-29 MED ORDER — LISINOPRIL 20 MG PO TABS
40.0000 mg | ORAL_TABLET | Freq: Every day | ORAL | 3 refills | Status: DC
Start: ? — End: 2021-01-29

## 2021-01-29 MED ORDER — ATORVASTATIN CALCIUM 80 MG PO TABS
ORAL_TABLET | ORAL | 3 refills | Status: DC
Start: ? — End: 2021-01-29

## 2021-01-29 MED ORDER — ASPIRIN 81 MG PO CHEW
81.0000 mg | CHEWABLE_TABLET | Freq: Every day | ORAL | 3 refills | Status: DC
Start: ? — End: 2021-01-29

## 2021-01-29 MED ORDER — TICAGRELOR 90 MG PO TABS
90.0000 mg | ORAL_TABLET | Freq: Two times a day (BID) | ORAL | 3 refills | Status: DC
Start: ? — End: 2021-01-29

## 2021-01-29 MED ORDER — LABETALOL HCL 100 MG PO TABS
100.0000 mg | ORAL_TABLET | Freq: Two times a day (BID) | ORAL | 3 refills | Status: DC
Start: ? — End: 2021-01-29

## 2021-01-29 NOTE — Progress Notes (Signed)
Subjective:    Patient ID: Troy Velasquez is a 80 y.o. male.    Patient is here today for hospital follow-up visit.  He was recently admitted to the hospital 01/22/2021 through 01/27/2021.  Patient was in the hospital with chest pain.  He was ultimately diagnosed with non-ST elevation MI.  Patient underwent cardiac catheterization.  He had a stent placed in the left circumflex artery.  After cardiac stent, patient was chest pain-free.  His medications were adjusted.  Simvastatin was discontinued.  He is now taking Lipitor 80 mg daily.  Aspirin and Plavix was changed to aspirin and Brilinta.  Patient seems to be doing well with medication.  No signs of bleeding.    Blood pressure medication was adjusted.  He is now taking labetalol 100 mg twice daily.  He is taking lisinopril 20 mg, 2 tablets daily for a total of 40 mg.  Blood pressure has been doing well at home    He does have some underlying COPD.  He continues to use albuterol as needed.  Tessalon Perles seem to be helping with his cough    The following portions of the patient's history were reviewed and updated as appropriate: allergies, current medications, past family history, past medical history, past social history, past surgical history, and problem list.    Review of Systems   Constitutional:  Positive for fatigue. Negative for chills and fever.   Eyes:  Negative for visual disturbance.   Respiratory:  Positive for cough. Negative for chest tightness, shortness of breath and wheezing.    Cardiovascular:  Negative for chest pain.   Gastrointestinal:  Negative for abdominal pain and blood in stool.   Genitourinary:  Negative for difficulty urinating.   Musculoskeletal:  Positive for arthralgias.   Skin:  Negative for rash.   Neurological:  Negative for dizziness, weakness and numbness.   Psychiatric/Behavioral:  Negative for sleep disturbance.          Objective:    Physical Exam  Constitutional:       Appearance: Normal appearance.   HENT:      Head:  Normocephalic.   Cardiovascular:      Rate and Rhythm: Normal rate and regular rhythm.      Heart sounds: Normal heart sounds. No murmur heard.  Pulmonary:      Effort: Pulmonary effort is normal.      Breath sounds: Normal breath sounds. No wheezing or rhonchi.   Musculoskeletal:      Right lower leg: No edema.      Left lower leg: No edema.   Skin:     General: Skin is warm and dry.   Neurological:      General: No focal deficit present.      Mental Status: He is alert and oriented to person, place, and time.   Psychiatric:         Mood and Affect: Mood normal.         Behavior: Behavior normal.           Assessment:       1. NSTEMI (non-ST elevated myocardial infarction)    2. Coronary artery disease involving native coronary artery of native heart without angina pectoris    3. Primary hypertension    4. Dyslipidemia    5. Pulmonary emphysema, unspecified emphysema type    6. Screening PSA (prostate specific antigen)          Plan:       Reviewed records from  hospitalization.  Non-ST elevation MI was treated with a stent in the left circumflex artery.  Continue with aspirin and Brilinta  Reviewed records with cardiology.  Continue with aspirin, Brilinta, labetalol, Lipitor, and lisinopril  Blood pressure stable today.  Continue labetalol and lisinopril  Cholesterol previously controlled with Zocor.  Patient is now taking Lipitor.  Repeat lipid panel in 3 months  COPD is stable.  Continue with albuterol as needed  Orders placed for screening PSA in 3 months

## 2021-02-02 ENCOUNTER — Encounter (INDEPENDENT_AMBULATORY_CARE_PROVIDER_SITE_OTHER): Payer: Self-pay | Admitting: Family Medicine

## 2021-02-02 ENCOUNTER — Ambulatory Visit (INDEPENDENT_AMBULATORY_CARE_PROVIDER_SITE_OTHER): Payer: Medicare PPO | Admitting: Family Medicine

## 2021-02-02 VITALS — BP 138/58 | HR 88 | Temp 98.5°F | Resp 18 | Ht 67.0 in | Wt 157.0 lb

## 2021-02-02 DIAGNOSIS — T7840XA Allergy, unspecified, initial encounter: Secondary | ICD-10-CM

## 2021-02-02 DIAGNOSIS — J439 Emphysema, unspecified: Secondary | ICD-10-CM

## 2021-02-02 DIAGNOSIS — L509 Urticaria, unspecified: Secondary | ICD-10-CM

## 2021-02-02 DIAGNOSIS — I2510 Atherosclerotic heart disease of native coronary artery without angina pectoris: Secondary | ICD-10-CM

## 2021-02-02 MED ORDER — PREDNISONE 20 MG PO TABS
ORAL_TABLET | ORAL | 0 refills | Status: DC
Start: ? — End: 2021-02-02

## 2021-02-02 NOTE — Progress Notes (Unsigned)
Subjective:    Patient ID: Troy Velasquez is a 80 y.o. male.    Patient is here today for an acute visit.  He has concerns for allergic reaction with hives.  Patient has known CAD.  He recently underwent a cardiac catheterization for non-ST elevation MI.  Patient had stents placed last week.    With prior non-ST elevation MI, patient reports he was discharged on aspirin and Plavix.  Reports he had a similar allergic reaction when taking aspirin.  For the last several years, he has been taking Plavix.  Patient did have his most recent non-ST elevation MI while taking Plavix.    Patient feels that his rash is consistent with his previous aspirin allergy.  He has a red, raised, welts along most of the back, arms, neck, waistline, and legs.  Denies any lip or tongue swelling.  No difficulty breathing or wheezing.  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  Mainly his statin therapy was changed to Lipitor, and his beta-blocker was changed to labetalol.          The following portions of the patient's history were reviewed and updated as appropriate: allergies, current medications, past family history, past medical history, past social history, past surgical history, and problem list.    Review of Systems   Constitutional:  Negative for chills and fever.   HENT:  Negative for sore throat and trouble swallowing.    Eyes:  Negative for visual disturbance.   Respiratory:  Negative for shortness of breath and wheezing.    Cardiovascular:  Negative for chest pain, palpitations and leg swelling.   Gastrointestinal:  Negative for abdominal pain.   Skin:  Positive for color change and rash.        Itching         Objective:    Physical Exam  Constitutional:       Appearance: Normal appearance.   HENT:      Head: Normocephalic.      Mouth/Throat:      Mouth: Mucous membranes are moist.   Cardiovascular:      Rate and Rhythm: Normal rate and  regular rhythm.      Heart sounds: Normal heart sounds. No murmur heard.  Pulmonary:      Effort: Pulmonary effort is normal.      Breath sounds: Normal breath sounds. No wheezing or rhonchi.   Skin:     General: Skin is warm.      Findings: Erythema and rash present.      Comments: Diffuse raised, urticarial along the back, abdomen, legs, upper arms, and neck   Neurological:      General: No focal deficit present.      Mental Status: He is alert and oriented to person, place, and time.   Psychiatric:         Mood and Affect: Mood normal.         Behavior: Behavior normal.         Assessment:       1. Allergic reaction, initial encounter    2. Hives    3. Coronary artery disease involving native coronary artery of native heart without angina pectoris    4. Pulmonary emphysema, unspecified emphysema type          Plan:       Allergic reaction could be due to multiple medications although patient is reporting  a very similar reaction to aspirin in the past.  Patient is only 1 week removed from stent placement.  We discussed a course of prednisone  Hives could very well be related to aspirin.  Hold aspirin for tomorrow until we can talk to his cardiologist.  CAD was recently stented.  Continue with labetalol, Lipitor, Brilinta.  Hold aspirin until we can speak to cardiology tomorrow  COPD is stable.  Continue with albuterol as needed.  No signs of angioedema or lip/tongue swelling    Addendum: 02/04/2021.  We were able to speak to cardiology today.  We will try to maintain patient on weeks 1 month aspirin therapy. Patient did receive aspirin desensitization during hospitalization.  We spoke with patient around 3 PM today.  Patient will continue with aspirin.  He will take aspirin tonight.  Prescription sent in for Singulair and Zyrtec to help with the hives.  Okay to use Benadryl as needed.  Patient has 3 more days of prednisone.  We can also use more prednisone to get through the next 2 to 3 weeks if needed.

## 2021-02-04 ENCOUNTER — Encounter (INDEPENDENT_AMBULATORY_CARE_PROVIDER_SITE_OTHER): Payer: Self-pay

## 2021-02-04 ENCOUNTER — Encounter (INDEPENDENT_AMBULATORY_CARE_PROVIDER_SITE_OTHER): Payer: Self-pay | Admitting: Family Medicine

## 2021-02-04 MED ORDER — ALBUTEROL SULFATE HFA 108 (90 BASE) MCG/ACT IN AERS
2.0000 | INHALATION_SPRAY | Freq: Four times a day (QID) | RESPIRATORY_TRACT | 11 refills | Status: DC | PRN
Start: ? — End: 2021-02-04

## 2021-02-04 MED ORDER — MONTELUKAST SODIUM 10 MG PO TABS
10.0000 mg | ORAL_TABLET | Freq: Every day | ORAL | 2 refills | Status: DC
Start: ? — End: 2021-02-04

## 2021-02-04 MED ORDER — ASPIRIN 81 MG PO CHEW
81.0000 mg | CHEWABLE_TABLET | Freq: Every day | ORAL | 3 refills | Status: DC
Start: ? — End: 2021-02-04

## 2021-02-04 MED ORDER — NITROGLYCERIN 0.4 MG SL SUBL
0.4000 mg | SUBLINGUAL_TABLET | SUBLINGUAL | 5 refills | Status: DC | PRN
Start: ? — End: 2021-02-04

## 2021-02-04 MED ORDER — CETIRIZINE HCL 10 MG PO TABS
10.0000 mg | ORAL_TABLET | Freq: Every day | ORAL | 5 refills | Status: DC
Start: ? — End: 2021-02-04

## 2021-02-04 MED ORDER — TICAGRELOR 90 MG PO TABS
90.0000 mg | ORAL_TABLET | Freq: Two times a day (BID) | ORAL | 3 refills | Status: DC
Start: ? — End: 2021-02-04

## 2021-02-04 MED ORDER — ATORVASTATIN CALCIUM 80 MG PO TABS
ORAL_TABLET | ORAL | 3 refills | Status: DC
Start: ? — End: 2021-02-04

## 2021-02-04 MED ORDER — LABETALOL HCL 100 MG PO TABS
100.0000 mg | ORAL_TABLET | Freq: Two times a day (BID) | ORAL | 3 refills | Status: DC
Start: ? — End: 2021-02-04

## 2021-02-04 MED ORDER — LISINOPRIL 20 MG PO TABS
40.0000 mg | ORAL_TABLET | Freq: Every day | ORAL | 3 refills | Status: DC
Start: ? — End: 2021-02-04

## 2021-02-06 NOTE — Progress Notes (Incomplete)
Cardiology [place], [facility]  [address] [place] [address]  [phone]     Date: 02/06/2021  Patient Name: [name]  MRN#: [id number]  DOB: 30-Mar-1940    Provider: Margarita Grizzle, APRN-FNP-BC  PCP: Andrey Campanile, MD      Reason for visit: No chief complaint on file.    History:     [name] is a 80 y.o. male with a history of HTN, HLD and prediabetes who is here as a hospital follow up after recent PCI of the mid Cfx. He was discharged on DAPT (Brilinta) after aspirin desensitization to f/u as outpatient. .     Today he   No c/o any CP, SOB, palpitations, syncope/presyncope, orthopnea/PND, LE edema, or claudication.     Assessment:     [name] is a 80 y.o. male with    CAD: NSTEMI 01/22/21. LHC with PCI to the Mid Cfx, RCA with severe diffuse in-stent restenosis and distal RCA occlusion with collaterals that appear chronic..   - on DAPT (Brilinta), BB, ACE-I, statin and NTG.     HTN: controlled today  - instructed to monitor BP at home daily, keep a log, and bring log to each visit; Instructed to call office or PCP if BP readings are consistently elevated  - continue current meds     Lipids: FLP 01/22/21 total- 174, trig 88, HDL 48, LDL 108   - on Lipitor     Prediabetes:   - HGA1C 5.9 on 01/22/21     Plan:     1. Continue ASA, labetalol, lisinopril, brilinta, Lipitor and NTG   2. Encouraged a low sugar, low carb/no carb diet with vegetables, fish, chicken, [place] and eggs. Use sugar substitutes. Encouraged regular exercise and to stay well hydrated with water   3. Follow up in     Cardiovascular Workup:     Echo 01/23/21   Technically difficult study due to limited acoustic windows.  Left ventricular systolic function is normal.  The left ventricular ejection fraction by visual assessment is estimated to be 50-55%.    LHC 01/26/21  Successful PCI to an 80% mid left circumflex stenosis with a 3 x 12 mm Synergy DES.  RCA with severe  diffuse in-stent restenosis and distal RCA occlusion with collaterals that appears chronic.  Remainder of his CAD is moderate and diffuse.    Recommendations  Standard post-procedural care  Continue medical management and risk factor modification  Discharge home when criteria met  Follow up 4 weeks after discharge  Antiplatelet therapy with: Aspirin and Brilinta  Enroll in Cardiac Rehab    Review of Systems:     All systems were reviewed and are negative other than noted in the HPI.    Physical Exam:   There were no vitals filed for this visit.      Cardiovascular:  RRR  No murmur  No rubs, clicks, or gallops    Pulse exam:  2+ radial pulses  2+ DP pulses  2+ PT pulses    Extremities: Warm and well-perfused, no edema    Neck: Supple, no JVD, no bruits  General: Awake, alert, and in no acute distress  HEENT: Moist mucous membranes  Respiratory: Clear to auscultation  Abdominal/Gastrointestinal: Soft, non-tender, no masses, no organomegaly  Skin: Non jaundiced, no rashes  Neurological: Grossly nonfocal  Psychiatric: Normal affect    Past Medical History:     Past Medical  History:   Diagnosis Date   . Coronary artery disease    . High cholesterol    . HTN (hypertension)    . Wears glasses          Past Surgical History:     Past Surgical History:   Procedure Laterality Date   . Hx coronary artery bypass graft       Allergies:     Allergies   Allergen Reactions   . Aspirin Hives/ Urticaria     S/p desensitization 01/26/21 successful now tolerates       Medications:     No outpatient medications have been marked as taking for the 02/11/21 encounter (Appointment) with Margarita Grizzle, APRN-FNP-BC.     Family History:     Family Medical History:    None         Social History:     Social History     Socioeconomic History   . Marital status: Married   Tobacco Use   . Smoking status: Every Day     Packs/day: 1.00     Types: Cigarettes   . Smokeless tobacco: Never   Substance and Sexual Activity   . Alcohol use: No   . Drug  use: No       Thank you for allowing me to participate in the care of your patient. Please feel free to contact me if there are further questions.

## 2021-02-09 ENCOUNTER — Telehealth (INDEPENDENT_AMBULATORY_CARE_PROVIDER_SITE_OTHER): Payer: Self-pay

## 2021-02-09 ENCOUNTER — Ambulatory Visit (INDEPENDENT_AMBULATORY_CARE_PROVIDER_SITE_OTHER): Payer: Medicare PPO | Admitting: Family Medicine

## 2021-02-09 NOTE — Telephone Encounter (Signed)
Patient call requesting a referral to see Dr. Orvis Brill a cardiologist in [place].

## 2021-02-11 ENCOUNTER — Ambulatory Visit (INDEPENDENT_AMBULATORY_CARE_PROVIDER_SITE_OTHER): Payer: Commercial Managed Care - PPO | Admitting: NURSE PRACTITIONER, FAMILY

## 2021-02-11 ENCOUNTER — Other Ambulatory Visit: Payer: Self-pay

## 2021-02-11 ENCOUNTER — Encounter (INDEPENDENT_AMBULATORY_CARE_PROVIDER_SITE_OTHER): Payer: Self-pay | Admitting: NURSE PRACTITIONER, FAMILY

## 2021-02-11 VITALS — BP 129/73 | HR 66 | Ht 66.0 in | Wt 162.4 lb

## 2021-02-11 DIAGNOSIS — I214 Non-ST elevation (NSTEMI) myocardial infarction: Secondary | ICD-10-CM

## 2021-02-11 DIAGNOSIS — I10 Essential (primary) hypertension: Secondary | ICD-10-CM

## 2021-02-11 DIAGNOSIS — R7303 Prediabetes: Secondary | ICD-10-CM

## 2021-02-11 DIAGNOSIS — E785 Hyperlipidemia, unspecified: Secondary | ICD-10-CM

## 2021-02-11 DIAGNOSIS — I2510 Atherosclerotic heart disease of native coronary artery without angina pectoris: Secondary | ICD-10-CM

## 2021-02-11 NOTE — Progress Notes (Signed)
Cardiology [place], [facility]  [address] [place] [address]  [phone]     Date: 02/11/2021  Patient Name: [name]  MRN#: [id number]  DOB: 1940-08-15    Provider: Margarita Grizzle, APRN-FNP-BC  PCP: Andrey Campanile, MD      Reason for visit: Hypertension and Hospital Follow Up    History:     [name] is a 80 y.o. male with a history of HTN, HLD and prediabetes who is here as a hospital follow up after recent PCI of the mid Cfx. He was discharged on DAPT (Brilinta) after aspirin desensitization to f/u as outpatient. .     Today he presents to the office for a follow-up after an NSTEMI on 01/22/2021 with PCI at the mid circumflex.  He has requested a note to go back to work, he drive say small vehicle delivering parts.  No c/o any CP, SOB, palpitations, syncope/presyncope, orthopnea/PND, LE edema, or claudication.     Assessment:     [name] is a 80 y.o. male with    CAD: NSTEMI 01/22/21. LHC with PCI to the Mid Cfx, RCA with severe diffuse in-stent restenosis and distal RCA occlusion with collaterals that appear chronic..   - on DAPT (Brilinta), BB, ACE-I, statin and NTG.   - denies CP/SOB on PE today    HTN: controlled today  - instructed to monitor BP at home daily, keep a log, and bring log to each visit; Instructed to call office or PCP if BP readings are consistently elevated  - continue current meds     Lipids: FLP 01/22/21 total- 174, trig 88, HDL 48, LDL 108   - on Lipitor     Prediabetes:   - HGA1C 5.9 on 01/22/21     Plan:     1. Continue ASA, labetalol, lisinopril, brilinta, Lipitor and NTG   2. Encouraged a low sugar, low carb/no carb diet with vegetables, fish, chicken, [place] and eggs. Use sugar substitutes. Encouraged regular exercise and to stay well hydrated with water   3. Follow up in 3 months, sooner if needed  4. 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    Cardiovascular Workup:     Echo 01/23/21   Technically difficult study due to limited acoustic windows.  Left ventricular systolic function is normal.  The left ventricular ejection fraction by visual assessment is estimated to be 50-55%.    LHC 01/26/21  Successful PCI to an 80% mid left circumflex stenosis with a 3 x 12 mm Synergy DES.  RCA with severe diffuse in-stent restenosis and distal RCA occlusion with collaterals that appears chronic.  Remainder of his CAD is moderate and diffuse.    Recommendations  Standard post-procedural care  Continue medical management and risk factor modification  Discharge home when criteria met  Follow up 4 weeks after discharge  Antiplatelet therapy with: Aspirin and Brilinta  Enroll in Cardiac Rehab    Review of Systems:     All systems were reviewed and are negative other than noted in the HPI.    Physical Exam:     Vitals:    02/11/21 1307   BP: 129/73   Pulse: 66   SpO2: 98%   Weight: 73.7 kg (162 lb 6.4 oz)   Height: 1.676 m (_0 )   BMI: 26.27  Cardiovascular:  RRR  No murmur  No rubs, clicks, or gallops    Pulse exam:  2+ radial pulses  2+ DP pulses  2+ PT pulses    Extremities: Warm and well-perfused, no edema    Neck: Supple, no JVD, no bruits  General: Awake, alert, and in no acute distress  HEENT: Moist mucous membranes  Respiratory: Clear to auscultation  Abdominal/Gastrointestinal: Soft, non-tender, no masses, no organomegaly  Skin: Non jaundiced, no rashes  Neurological: Grossly nonfocal  Psychiatric: Normal affect    Past Medical History:     Past Medical History:   Diagnosis Date    Coronary artery disease     High cholesterol     HTN (hypertension)     Wears glasses          Past Surgical History:     Past Surgical History:   Procedure Laterality Date    Hx coronary artery bypass graft       Allergies:     Allergies   Allergen Reactions    Aspirin Hives/ Urticaria     S/p desensitization 01/26/21 successful now tolerates        Medications:     Outpatient Medications Marked as Taking for the 02/11/21 encounter (Office Visit) with Margarita Grizzle, APRN-FNP-BC   Medication Sig    aspirin 81 mg Oral Tablet, Chewable Chew 1 Tablet (81 mg total) Once a day for 90 days    atorvastatin (LIPITOR) 80 mg Oral Tablet Take 1 Tablet (80 mg total) by mouth Every evening for 90 days    labetaloL (NORMODYNE) 100 mg Oral Tablet Take 1 Tablet (100 mg total) by mouth Every 12 hours for 90 days    lisinopriL (PRINIVIL) 20 mg Oral Tablet Take 2 Tablets (40 mg total) by mouth Once a day for 30 days    ticagrelor (BRILINTA) 90 mg Oral Tablet Take 1 Tablet (90 mg total) by mouth Twice daily for 90 days     Family History:     Family Medical History:    None         Social History:     Social History     Socioeconomic History    Marital status: Married   Tobacco Use    Smoking status: Former     Packs/day: 1.00     Types: Cigarettes     Quit date: 2017     Years since quitting: 5.9    Smokeless tobacco: Never   Substance and Sexual Activity    Alcohol use: No    Drug use: No       Thank you for allowing me to participate in the care of your patient. Please feel free to contact me if there are further questions.     Margarita Grizzle, APRN-FNP-BC  02/11/2021, 13:41    I independently of the faculty provider spent a total of (30) minutes in direct care of this patient including initial evaluation, review of laboratory, radiology, diagnostic studies, review of medical record, order entry and coordination of care.       MMODAL disclaimer:  Portions of this note may be dictated using voice recognition software or a dictation service. Variances in spelling and vocabulary are possible and unintentional. Not all errors are caught/corrected. Please notify the [facility] if any discrepancies are noted or if the meaning of any statement is not clear.

## 2021-02-12 NOTE — Telephone Encounter (Signed)
Called and spoke with Dr. Normand Sloop office patient does not need a referral just needs to call the office and schedule with Dr. Orvis Brill.     Called the patient to relay the message from Dr. Normand Sloop office, patient informed me that he seen the NP Hinson at the office on 02/11/2021 and was ok with seeing him has F/U next month.

## 2021-02-12 NOTE — Telephone Encounter (Signed)
Double check and see what he needs.  He should have an apt with Dr. Orvis Brill scheduled.  Thanks

## 2021-02-15 ENCOUNTER — Other Ambulatory Visit: Payer: Self-pay

## 2021-02-15 ENCOUNTER — Emergency Department
Admission: EM | Admit: 2021-02-15 | Discharge: 2021-02-15 | Disposition: A | Discharge status: Home / Self Care | Payer: Commercial Managed Care - PPO | Attending: Emergency Medicine | Admitting: Emergency Medicine

## 2021-02-15 DIAGNOSIS — I219 Acute myocardial infarction, unspecified: Secondary | ICD-10-CM | POA: Insufficient documentation

## 2021-02-15 DIAGNOSIS — Z7982 Long term (current) use of aspirin: Secondary | ICD-10-CM | POA: Insufficient documentation

## 2021-02-15 DIAGNOSIS — Z79899 Other long term (current) drug therapy: Secondary | ICD-10-CM | POA: Insufficient documentation

## 2021-02-15 DIAGNOSIS — Z87891 Personal history of nicotine dependence: Secondary | ICD-10-CM | POA: Insufficient documentation

## 2021-02-15 DIAGNOSIS — R040 Epistaxis: Secondary | ICD-10-CM | POA: Insufficient documentation

## 2021-02-15 NOTE — ED Nurses Note (Signed)
Patient discharged home with family.  AVS reviewed with patient/care giver.  A written copy of the AVS and discharge instructions was given to the patient/care giver.  Questions sufficiently answered as needed.  Patient/care giver encouraged to follow up with PCP as indicated.  In the event of an emergency, patient/care giver instructed to call 911 or go to the nearest emergency room.      Current Discharge Medication List        CONTINUE these medications - NO CHANGES were made during your visit.        Details   aspirin 81 mg Tablet, Chewable   81 mg, Oral, DAILY  Qty: 90 Tablet  Refills: 0     atorvastatin 80 mg Tablet  Commonly known as: LIPITOR   80 mg, Oral, EVERY EVENING  Qty: 90 Tablet  Refills: 0     benzonatate 100 mg Capsule  Commonly known as: TESSALON   100 mg, Oral, EVERY 8 HOURS PRN  Qty: 21 Capsule  Refills: 0     labetaloL 100 mg Tablet  Commonly known as: NORMODYNE   100 mg, Oral, EVERY 12 HOURS  Qty: 180 Tablet  Refills: 0     lisinopriL 20 mg Tablet  Commonly known as: PRINIVIL   40 mg, Oral, DAILY  Qty: 60 Tablet  Refills: 0     nitroGLYCERIN 0.4 mg Tablet, Sublingual  Commonly known as: NITROSTAT   0.4 mg, Sublingual, EVERY 5 MIN PRN, for 3 doses over 15 minutes  Qty: 21 Tablet  Refills: 0     ticagrelor 90 mg Tablet  Commonly known as: BRILINTA   90 mg, Oral, 2 TIMES DAILY  Qty: 180 Tablet  Refills: 0

## 2021-02-15 NOTE — ED Provider Notes (Signed)
Darreld Mclean, MD  [facility]  Emergency Department Visit Note      Chief Complaint:  Epistaxis    HISTORY OF PRESENT ILLNESS     [name], date of birth Jun 25, 1940, is a 80 y.o.male who presents to the Emergency Department with epistaxis.  Patient recently started on Brilinta and aspirin after an MI in early December.  The patient reports he was blowing his nose this morning and started bleeding.  It started as a this trickle from his left Darene Lamer.  He called EMS and they put a clip on and he was brought here to the emergency department.  He denies any chest pain or shortness of breath.  He denies any headache or blurry vision.  He has a little bit of blood leaking out of his left nostril.  He denies any other acute    REVIEW OF SYSTEMS     The pertinent positive and negative symptoms are as per HPI. All other systems reviewed and are negative.     PATIENT HISTORY     Past Medical History:  Past Medical History:   Diagnosis Date    Coronary artery disease     High cholesterol     HTN (hypertension)     Wears glasses        Past Surgical History:  Past Surgical History:   Procedure Laterality Date    Hx coronary artery bypass graft         Family History:  Family Medical History:    None           Social History:  Social History     Tobacco Use    Smoking status: Former     Packs/day: 1.00     Types: Cigarettes     Quit date: 2017     Years since quitting: 5.9    Smokeless tobacco: Never   Substance Use Topics    Alcohol use: No    Drug use: No         Medications:  Current Outpatient Medications   Medication Sig    aspirin 81 mg Oral Tablet, Chewable Chew 1 Tablet (81 mg total) Once a day for 90 days    atorvastatin (LIPITOR) 80 mg Oral Tablet Take 1 Tablet (80 mg total) by mouth Every evening for 90 days    benzonatate (TESSALON) 100 mg Oral Capsule Take 1 Capsule (100 mg total) by mouth Every 8 hours as needed for Cough    labetaloL (NORMODYNE) 100 mg Oral Tablet Take 1 Tablet (100 mg  total) by mouth Every 12 hours for 90 days    lisinopriL (PRINIVIL) 20 mg Oral Tablet Take 2 Tablets (40 mg total) by mouth Once a day for 30 days    nitroGLYCERIN (NITROSTAT) 0.4 mg Sublingual Tablet, Sublingual Place 1 Tablet (0.4 mg total) under the tongue Every 5 minutes as needed for Chest pain for up to 3 doses for 3 doses over 15 minutes    ticagrelor (BRILINTA) 90 mg Oral Tablet Take 1 Tablet (90 mg total) by mouth Twice daily for 90 days       Allergies:  Allergies   Allergen Reactions    Aspirin Hives/ Urticaria     S/p desensitization 01/26/21 successful now tolerates       PHYSICAL EXAM     Vitals:  ED Triage Vitals [02/15/21 1449]   BP (Non-Invasive) (!) 172/149   Heart Rate 76   Respiratory Rate 18  Temperature 36.4 C (97.6 F)   SpO2 97 %   Weight 72.8 kg (160 lb 6.4 oz)   Height 1.676 m (5\' 6" )     Symptoms.  Constitutional: Ambulatory. Nontoxic. No acute distress.   Head: Normocephalic and atraumatic.   ENT: Moist mucous membranes. No erythema or exudates in the oropharynx. Normal voice.  Minimal bleeding from the left naris and a clot in the posterior pharynx.  Eyes: EOM are normal. Pupils are equal, round, and reactive to light. No scleral icterus.   Neck: Neck supple. FROM neck.  Cardiovascular: Normal rate and regular rhythm. No murmur heard. 2+ distal pulses all 4 extremities.  Pulmonary/Chest: Effort normal and breath sounds normal.   Abdominal: Soft with no distension. No abdominal tenderness  Back: There is no CVA tenderness.   Musculoskeletal:  No clubbing or cyanosis.  Neurological: Patient is alert and awake. Strength and sensation normal in all extremities. Normal facial symmetry and speech.   Skin: Skin is warm and dry.       DIAGNOSTIC STUDIES     Labs:    No results found for any visits on 02/15/21.  Labs reviewed and interpreted by me.    Radiology:    No orders to display     Radiological imaging interpreted by radiologist. Report reviewed by me.        ED PROGRESS NOTE /  MEDICAL DECISION MAKING     Old records reviewed by me:  I have reviewed the nurse's notes. I have reviewed the patient's problem list and pertinent past medical records.    No orders of the defined types were placed in this encounter.              Pre-Disposition Vitals:  Filed Vitals:    02/15/21 1449 02/15/21 1522   BP: (!) 172/149 (!) 152/66   Pulse: 76    Resp: 18    Temp: 36.4 C (97.6 F)    SpO2: 10%      80 year old male with multiple medical problems who presents with epistaxis.  The patient currently has no active bleeding.  He does have some minimal oozing that is dabbed away without any problem.  He has no posterior bleed at this time.  I offered him nasal packing however we both agreed that since he is not actively bleeding and that I a nasal packing is very uncomfortable that we will hold off at this time.  He will return if the bleeding starts up again and they are unable to control it.  I gave him precautions at had a control of nasal bleed.  A family member had requested coags however I told them that with the patient being on Brilinta the coags would not be of value in terms of changing management and the patient agrees and does not want blood work done at this time.  I will discharge the patient home.  If the patient's symptoms worsening can return anytime to the emergency department.    CLINICAL IMPRESSION     Encounter Diagnosis   Name Primary?    Epistaxis Yes         DISPOSITION/PLAN     Discharged      Prescriptions:     Discharge Medication List as of 02/15/2021  3:23 PM           Follow-Up:     02/17/2021, MD  [address]  [address]  [place]  [phone]  Jayme Cloud, MD  [address]  [address]  [place] [address]  [phone]    Schedule an appointment as soon as possible for a visit in 2 days        Condition at Disposition: Stable        I, Darreld Mclean, MD, scribed for Darreld Mclean, MD on 02/15/2021 at 4:19 PM     Darreld Mclean,  MD

## 2021-02-15 NOTE — Discharge Instructions (Addendum)
Please get saline spray for your nose and use a humidifier.

## 2021-02-15 NOTE — ED Triage Notes (Signed)
Pt recently started on Brilinta and ASA after an MI in early December, changed numerous medications for his blood pressure, started with a nose bleed this morning, it has been trickling all day from left nare. Clip applied, patient dabbing small amount of blood leaking from nare. Bleeding started after blowing his nose, denies injury.

## 2021-02-18 ENCOUNTER — Encounter (HOSPITAL_COMMUNITY): Payer: Self-pay | Admitting: Student in an Organized Health Care Education/Training Program

## 2021-02-18 ENCOUNTER — Ambulatory Visit (HOSPITAL_COMMUNITY): Payer: Commercial Managed Care - PPO | Admitting: Student in an Organized Health Care Education/Training Program

## 2021-02-18 ENCOUNTER — Other Ambulatory Visit: Payer: Self-pay

## 2021-02-18 VITALS — Temp 97.7°F | Ht 66.0 in | Wt 162.0 lb

## 2021-02-18 DIAGNOSIS — Z87898 Personal history of other specified conditions: Secondary | ICD-10-CM

## 2021-02-18 MED ORDER — OXYMETAZOLINE 0.05 % NASAL SPRAY
2.0000 | Freq: Two times a day (BID) | NASAL | Status: AC | PRN
Start: 2021-02-18 — End: ?

## 2021-02-18 NOTE — H&P (Signed)
ENT, [facility]  [address]  [place] [address]    History and Physical     Name: [name] MRN:  [id number]   Date: 02/18/2021 Age: 80 y.o.        Chief Complaint:    Chief Complaint   Patient presents with    Nosebleeds     NPV-referred by ED patient is doing well      History of Present Illness: [name] is a 80 y.o. male presents as a new patient for evaluation of epistaxis.  Patient was started on Brilinta and aspirin in early December 2022 after a heart attack.  He woke up on Christmas morning with left-sided bleeding and was seen in the emergency department and a nasal clip was placed.  Patient notes rare intermittent bleeds in the past but none this large.  He said that the bleeding has stopped.  He has bought a humidifier.  He is here for evaluation of possible cauterization.    Past Medical History:  Past Medical History:   Diagnosis Date    Coronary artery disease     High cholesterol     HTN (hypertension)     Wears glasses          Past Surgical History:  Past Surgical History:   Procedure Laterality Date    Hx coronary artery bypass graft       Medications:  Outpatient Medications Marked as Taking for the 02/18/21 encounter (Office Visit) with Jayme Cloud, MD   Medication Sig    aspirin 81 mg Oral Tablet, Chewable Chew 1 Tablet (81 mg total) Once a day for 90 days    atorvastatin (LIPITOR) 80 mg Oral Tablet Take 1 Tablet (80 mg total) by mouth Every evening for 90 days    benzonatate (TESSALON) 100 mg Oral Capsule Take 1 Capsule (100 mg total) by mouth Every 8 hours as needed for Cough    labetaloL (NORMODYNE) 100 mg Oral Tablet Take 1 Tablet (100 mg total) by mouth Every 12 hours for 90 days    lisinopriL (PRINIVIL) 20 mg Oral Tablet Take 2 Tablets (40 mg total) by mouth Once a day for 30 days    nitroGLYCERIN (NITROSTAT) 0.4 mg Sublingual Tablet, Sublingual Place 1 Tablet (0.4 mg total) under the tongue Every 5 minutes as needed for Chest pain for  up to 3 doses for 3 doses over 15 minutes    oxymetazoline (AFRIN) 0.05 % Nasal Spray, Non-Aerosol Administer 2 Sprays into each nostril Twice per day as needed (for active nosebleed)    ticagrelor (BRILINTA) 90 mg Oral Tablet Take 1 Tablet (90 mg total) by mouth Twice daily for 90 days      Family History:  Family Medical History:    None         Social History:  Social History     Occupational History    Not on file   Tobacco Use    Smoking status: Former     Packs/day: 1.00     Types: Cigarettes     Quit date: 2017     Years since quitting: 5.9    Smokeless tobacco: Never   Substance and Sexual Activity    Alcohol use: No    Drug use: No    Sexual activity: Not on file     Allergies:  Allergies   Allergen Reactions    Aspirin Hives/ Urticaria     S/p desensitization 01/26/21 successful now tolerates  Review of Systems:                                                          Denies fevers or chills. All other systems reviewed and found to be negative.    Physical Exam:    Temperature: 36.5 C (97.7 F)        Height: 167.6 cm (5\' 6" ) Weight: 73.5 kg (162 lb) Body mass index is 26.15 kg/m.    General Appearance: Pleasant, cooperative, healthy, and in no acute distress.  Eyes: Conjunctivae/corneas clear, PERRLA, EOM's intact.  Head and Face: Normocephalic, atraumatic.  Face symmetric, no obvious lesions.   Ears: pinnae normal b/l  Nose:  External pyramid midline. Septum midline. Pinpoint dried blood in two areas on left anterior septum, no masses or lesions. No active bleeding.   Oral Cavity/Oropharynx: No mucosal lesions, masses, or pharyngeal asymmetry. No posterior bleeding.   Hypopharynx/Larynx:voice normal.  Cardiovascular:  Good perfusion of upper extremities.  No cyanosis of the hands or fingers.  Lungs: No apparent stridorous breathing. No acute distress. Equal work of breathing without any tachypnea or accessory muscle use   Skin: Skin warm and dry.  Neurologic: Cranial nerves:  grossly  intact.  Psychiatric:  Alert and oriented x 3.    Review of Information:  none    Procedure:       Assessment:   [name] is a 80 y.o. male who presents with hx left epistaxis, on aspirin and brilinta. No active bleeding today.       ICD-10-CM    1. H/O epistaxis  Z87.898             Plan:  Orders Placed This Encounter    oxymetazoline (AFRIN) 0.05 % Nasal Spray, Non-Aerosol     Discussed with the patient I do not recommend cauterization at this point.  Recurrent epistaxis has not been an issue for him.  I did discuss the risk of nosebleed on blood thinners and in the wintertime with dry heat.  Advised that we could cauterize in the future if this becomes a persistent issue but want to do conservative measures at this point.  Provided handout on moisturization.  Recommend running a humidifier at night.  Recommend using Vaseline at night to both nostrils.  Recommend use of saline gel and/or spray during the day.  No nose blowing for a week.  Provided script for over-the-counter Afrin nasal spray to be used in the event of an acute bleed.  I will see patient back in 1 month to re-evaluate the nose.  Advised him to call sooner if any bleeding or if severe go to the emergency department.    04-30-2004, MD 02/18/2021 10:46     02/20/2021, MD    CC:    PCP Jayme Cloud, MD  [address] [place] [address]  [place]   Referring Provider No referring provider defined for this encounter.     This note was completed using voice recognition technology. Please excuse any errors that have occurred as a result and contact me if any parts of the note are unclear.

## 2021-02-18 NOTE — Patient Instructions (Addendum)
Epistaxis nosebleed instructions  -Patient educated about humidification to promote healing  -Humidifier use on warm setting nightly   -Apply Ayr or other brand saline gel during day every 3-4hrs, or nasal saline drops every 1-2 hrs.   -Apply Vaseline or Bacitracin at night.    -Precautions: No nose blowing x 1 weeks.  Sneeze with open mouth. No major straining    Afrin (oxymetazoline) alone is very useful in the event of an active nosebleed. Can apply 2 sprays each nostril and hold pressure for 10 minutes if this happens.  However, again, this should only be used for an active bleed.

## 2021-02-23 ENCOUNTER — Other Ambulatory Visit (INDEPENDENT_AMBULATORY_CARE_PROVIDER_SITE_OTHER): Payer: Self-pay | Admitting: Family Medicine

## 2021-03-19 ENCOUNTER — Encounter (HOSPITAL_COMMUNITY): Payer: Self-pay | Admitting: Student in an Organized Health Care Education/Training Program

## 2021-03-30 ENCOUNTER — Encounter (INDEPENDENT_AMBULATORY_CARE_PROVIDER_SITE_OTHER): Payer: Self-pay | Admitting: Family Medicine

## 2021-03-30 ENCOUNTER — Ambulatory Visit (INDEPENDENT_AMBULATORY_CARE_PROVIDER_SITE_OTHER): Payer: Medicare PPO | Admitting: Family Medicine

## 2021-03-30 VITALS — BP 162/58 | HR 90 | Temp 98.4°F | Resp 18 | Ht 66.0 in | Wt 158.0 lb

## 2021-03-30 DIAGNOSIS — J441 Chronic obstructive pulmonary disease with (acute) exacerbation: Secondary | ICD-10-CM

## 2021-03-30 DIAGNOSIS — I2510 Atherosclerotic heart disease of native coronary artery without angina pectoris: Secondary | ICD-10-CM

## 2021-03-30 DIAGNOSIS — I10 Essential (primary) hypertension: Secondary | ICD-10-CM

## 2021-03-30 MED ORDER — PREDNISONE 20 MG PO TABS
ORAL_TABLET | ORAL | 0 refills | Status: DC
Start: ? — End: 2021-03-30

## 2021-03-30 MED ORDER — AMLODIPINE BESYLATE 5 MG PO TABS
5.0000 mg | ORAL_TABLET | Freq: Every day | ORAL | 3 refills | Status: DC
Start: ? — End: 2021-03-30

## 2021-03-30 NOTE — Progress Notes (Signed)
Subjective:    Patient ID: Troy Velasquez is a 81 y.o. male.    Patient is here today for an acute visit.  He has concerns for elevated blood pressure.  He is about 6 weeks removed from a non-ST elevation MI where he had a stent placed.  Following his stent placement he was discharged on aspirin and Brilinta.  Patient did not tolerate aspirin secondary to hives.  This was the second time in his life where he had hives taking aspirin.  Patient is no longer following with his local cardiologist at [facility], and he has a new patient appointment with cardiology in [place].  Patient does continue with Brilinta    During hospitalization he was started with labetalol.  He is now taking labetalol 100 mg twice a day, and lisinopril 40 mg daily.  During hospitalization, amlodipine was discontinued.  Over the last week, systolic blood pressure has been staying around 170-180.  Patient otherwise asymptomatic    Reports some mild exacerbations of his COPD.  He has been using his albuterol inhaler about 3 times a day, but continues to have some mild congestion, shortness of breath, and wheezing      The following portions of the patient's history were reviewed and updated as appropriate: allergies, current medications, past family history, past medical history, past social history, past surgical history, and problem list.    Review of Systems   Constitutional:  Negative for chills and fever.   Respiratory:  Positive for chest tightness and wheezing. Negative for shortness of breath.    Cardiovascular:  Negative for chest pain.   Skin:  Negative for rash.   Neurological:  Negative for dizziness.         Objective:    Physical Exam  Constitutional:       Appearance: Normal appearance.   HENT:      Head: Normocephalic.   Cardiovascular:      Rate and Rhythm: Normal rate and regular rhythm.      Heart sounds: Normal heart sounds. No murmur heard.  Pulmonary:      Effort: Pulmonary effort is normal.      Breath  sounds: Wheezing and rhonchi present.   Musculoskeletal:      Right lower leg: No edema.      Left lower leg: No edema.   Skin:     General: Skin is warm and dry.   Neurological:      General: No focal deficit present.      Mental Status: He is alert and oriented to person, place, and time.   Psychiatric:         Mood and Affect: Mood normal.         Behavior: Behavior normal.           Assessment:       1. Primary hypertension    2. COPD exacerbation    3. Coronary artery disease involving native coronary artery of native heart without angina pectoris          Plan:       Blood pressure is elevated today.  Continue with labetalol and lisinopril.  Restart amlodipine at 5 mg daily.  Contact us next week with blood pressure readings, and we may adjust Norvasc as needed  COPD exacerbation seems to be a mild exacerbation.  Continue with albuterol.  Prescription given for 5-day prednisone burst  CAD stable.  Patient is no longer taking aspirin.  Follow through with cardiology  next month.  Continue with Lipitor, labetalol, and Brilinta

## 2021-04-06 ENCOUNTER — Other Ambulatory Visit (INDEPENDENT_AMBULATORY_CARE_PROVIDER_SITE_OTHER): Payer: Self-pay

## 2021-04-06 NOTE — Telephone Encounter (Signed)
Increase amlodipine from 5mg  to 10mg  daily.  Just take two of the 5mg 's

## 2021-04-06 NOTE — Telephone Encounter (Signed)
Pt stated that he has only been taking 20 mg of the lisinopril not 40 mg. Per verbal from Dr. Tharon Aquas advised pt to increase his lisinopril to 40 mg and continue to take amlodipine 5 mg and labetalol 100 mg. Pt verbalized understanding and will call with BP readings. Pt needs refills.

## 2021-04-06 NOTE — Telephone Encounter (Signed)
Pt's wife stated that the pt's BP has been running 161/72,162/69, 170/77, and 131/59. She stated that the 131/59 was after he had taken his medication. The pt is takling labetalol, lisinopril, and restarted the amlodipine 5 mg. Please advise.

## 2021-04-06 NOTE — Telephone Encounter (Incomplete Revision)
Pt stated that he has only been taking 20 mg of the lisinopril not 40 mg. Per verbal from Dr. Glassford advised pt to increase his lisinopril to 40 mg and continue to take amlodipine 5 mg and labetalol 100 mg. Pt verbalized understanding and will call with BP readings. Pt needs refills.

## 2021-04-07 MED ORDER — AMLODIPINE BESYLATE 5 MG PO TABS
5.0000 mg | ORAL_TABLET | Freq: Every day | ORAL | 3 refills | Status: DC
Start: ? — End: 2021-04-07

## 2021-04-07 MED ORDER — LISINOPRIL 20 MG PO TABS
40.0000 mg | ORAL_TABLET | Freq: Every day | ORAL | 3 refills | Status: DC
Start: ? — End: 2021-04-07

## 2021-04-15 ENCOUNTER — Ambulatory Visit (INDEPENDENT_AMBULATORY_CARE_PROVIDER_SITE_OTHER): Payer: Medicare PPO | Admitting: Family Medicine

## 2021-04-15 ENCOUNTER — Encounter (INDEPENDENT_AMBULATORY_CARE_PROVIDER_SITE_OTHER): Payer: Self-pay | Admitting: Family Medicine

## 2021-04-15 VITALS — BP 158/56 | HR 72 | Temp 97.9°F | Resp 17 | Ht 66.0 in | Wt 157.0 lb

## 2021-04-15 DIAGNOSIS — J439 Emphysema, unspecified: Secondary | ICD-10-CM

## 2021-04-15 DIAGNOSIS — I10 Essential (primary) hypertension: Secondary | ICD-10-CM

## 2021-04-15 DIAGNOSIS — I2510 Atherosclerotic heart disease of native coronary artery without angina pectoris: Secondary | ICD-10-CM

## 2021-04-15 MED ORDER — AMLODIPINE BESYLATE 10 MG PO TABS
10.0000 mg | ORAL_TABLET | Freq: Every day | ORAL | 3 refills | Status: DC
Start: ? — End: 2021-04-15

## 2021-04-15 MED ORDER — TICAGRELOR 90 MG PO TABS
90.0000 mg | ORAL_TABLET | Freq: Two times a day (BID) | ORAL | 3 refills | Status: DC
Start: ? — End: 2021-04-15

## 2021-04-15 NOTE — Progress Notes (Signed)
Subjective:    Patient ID: Troy Velasquez is a 81 y.o. male.    Patient is here today for follow-up on his blood pressure and coronary disease.  We have been adjusting his medications over the last several weeks.  Patient was in the hospital about 2 months ago for non-ST elevation MI.  He has an upcoming appointment with another cardiologist.  Patient was unable to tolerate aspirin secondary to hives.  He does continue on Brilinta twice daily.  No chest pain or shortness of breath    Blood pressure has been fluctuating.  We reviewed medications last week.  There has been some confusion with his medications.  After discharge, he was only taking lisinopril 20 mg daily as opposed to his previous prescription that had been 40 mg daily.  Over the last week, patient is back on 40 mg daily.  Blood pressure staying around 130-145 systolically.  He also continues with labetalol and amlodipine      The following portions of the patient's history were reviewed and updated as appropriate: allergies, current medications, past family history, past medical history, past social history, past surgical history, and problem list.    Review of Systems   Constitutional:  Negative for chills and fever.   Respiratory:  Negative for shortness of breath.    Cardiovascular:  Negative for chest pain.   Neurological:  Negative for dizziness.         Objective:    Physical Exam  Constitutional:       Appearance: Normal appearance.   HENT:      Head: Normocephalic.   Cardiovascular:      Rate and Rhythm: Normal rate and regular rhythm.      Heart sounds: Normal heart sounds. No murmur heard.  Pulmonary:      Effort: Pulmonary effort is normal.      Breath sounds: Normal breath sounds. No wheezing.   Musculoskeletal:      Right lower leg: No edema.      Left lower leg: No edema.   Skin:     General: Skin is warm and dry.   Neurological:      General: No focal deficit present.      Mental Status: He is alert and oriented to person, place, and time.    Psychiatric:         Mood and Affect: Mood normal.         Behavior: Behavior normal.           Assessment:       1. Primary hypertension    2. Coronary artery disease involving native coronary artery of native heart without angina pectoris          Plan:       Blood pressure seems to be improving, but still above his goal.  Continue with labetalol 100 mg twice daily, lisinopril 40 mg daily, and we will increase amlodipine from 5 mg to 10 mg.  Follow through with cardiology and follow-up with [place] in the next 3 to 4 weeks   CAD is stable.  Continue Brilinta.  Continue with Lipitor, labetalol, and lisinopril.  Follows with cardiology

## 2021-04-16 ENCOUNTER — Encounter (INDEPENDENT_AMBULATORY_CARE_PROVIDER_SITE_OTHER): Payer: Self-pay | Admitting: Family Medicine

## 2021-04-22 ENCOUNTER — Other Ambulatory Visit
Admission: RE | Admit: 2021-04-22 | Discharge: 2021-04-22 | Disposition: A | Discharge status: Home / Self Care | Payer: Medicare PPO | Source: Ambulatory Visit | Attending: Family Medicine | Admitting: Family Medicine

## 2021-04-22 DIAGNOSIS — E785 Hyperlipidemia, unspecified: Secondary | ICD-10-CM

## 2021-04-22 DIAGNOSIS — Z125 Encounter for screening for malignant neoplasm of prostate: Secondary | ICD-10-CM

## 2021-04-22 DIAGNOSIS — I10 Essential (primary) hypertension: Secondary | ICD-10-CM

## 2021-04-22 DIAGNOSIS — I2510 Atherosclerotic heart disease of native coronary artery without angina pectoris: Secondary | ICD-10-CM

## 2021-04-22 LAB — CBC AND DIFFERENTIAL
Basophils %: 1.1 % (ref 0.0–3.0)
Basophils Absolute: 0.1 10*3/uL (ref 0.0–0.3)
Eosinophils %: 11.5 % — ABNORMAL HIGH (ref 0.0–7.0)
Eosinophils Absolute: 0.8 10*3/uL (ref 0.0–0.8)
Hematocrit: 37.6 % — ABNORMAL LOW (ref 39.0–52.5)
Hemoglobin: 12.4 gm/dL — ABNORMAL LOW (ref 13.0–17.5)
Lymphocytes Absolute: 1.2 10*3/uL (ref 0.6–5.1)
Lymphocytes: 17.5 % (ref 15.0–46.0)
MCH: 30 pg (ref 28–35)
MCHC: 33 gm/dL (ref 32–36)
MCV: 92 fL (ref 80–100)
MPV: 9.9 fL (ref 6.0–10.0)
Monocytes Absolute: 0.7 10*3/uL (ref 0.1–1.7)
Monocytes: 11 % (ref 3.0–15.0)
Neutrophils %: 59 % (ref 42.0–78.0)
Neutrophils Absolute: 3.9 10*3/uL (ref 1.7–8.6)
PLT CT: 204 10*3/uL (ref 130–440)
RBC: 4.1 10*6/uL (ref 4.00–5.70)
RDW: 12.6 % (ref 11.0–14.0)
WBC: 6.6 10*3/uL (ref 4.0–11.0)

## 2021-04-22 LAB — COMPREHENSIVE METABOLIC PANEL
ALT: 48 U/L (ref 0–55)
AST (SGOT): 38 U/L (ref 10–42)
Albumin/Globulin Ratio: 1.31 Ratio (ref 0.80–2.00)
Albumin: 3.4 gm/dL — ABNORMAL LOW (ref 3.5–5.0)
Alkaline Phosphatase: 128 U/L (ref 40–145)
Anion Gap: 13.6 mMol/L (ref 7.0–18.0)
BUN / Creatinine Ratio: 17.8 Ratio (ref 10.0–30.0)
BUN: 19 mg/dL (ref 7–22)
Bilirubin, Total: 0.7 mg/dL (ref 0.1–1.2)
CO2: 24 mMol/L (ref 20–30)
Calcium: 9.3 mg/dL (ref 8.5–10.5)
Chloride: 109 mMol/L (ref 98–110)
Creatinine: 1.07 mg/dL (ref 0.80–1.30)
EGFR: 71 mL/min/{1.73_m2} (ref 60–150)
Globulin: 2.6 gm/dL (ref 2.0–4.0)
Glucose: 103 mg/dL — ABNORMAL HIGH (ref 71–99)
Osmolality Calculated: 286 mOsm/kg (ref 275–300)
Potassium: 4.6 mMol/L (ref 3.5–5.3)
Protein, Total: 6 gm/dL (ref 6.0–8.3)
Sodium: 142 mMol/L (ref 136–147)

## 2021-04-22 LAB — LIPID PANEL
Cholesterol: 127 mg/dL (ref 75–199)
Coronary Heart Disease Risk: 3.74
HDL: 34 mg/dL — ABNORMAL LOW (ref 40–55)
LDL Calculated: 76 mg/dL
Triglycerides: 84 mg/dL (ref 10–150)
VLDL: 17 (ref 0–40)

## 2021-04-22 LAB — PSA: PSA: 0.68 ng/mL (ref 0.000–4.000)

## 2021-04-24 ENCOUNTER — Other Ambulatory Visit (INDEPENDENT_AMBULATORY_CARE_PROVIDER_SITE_OTHER): Payer: Self-pay | Admitting: Family Medicine

## 2021-04-28 ENCOUNTER — Encounter (INDEPENDENT_AMBULATORY_CARE_PROVIDER_SITE_OTHER): Payer: Self-pay

## 2021-04-28 ENCOUNTER — Telehealth (INDEPENDENT_AMBULATORY_CARE_PROVIDER_SITE_OTHER): Payer: Self-pay

## 2021-04-28 NOTE — Progress Notes (Unsigned)
Subjective:    Patient ID: Troy Velasquez is a 81 y.o. male.    Patient is here today for follow-up on his blood pressure, cholesterol, coronary disease, and COPD.  He was seen about 2 weeks ago.  We were adjusting his blood pressure medications.  At that time, we had him continue with lisinopril 40 mg daily, labetalol 100 mg twice daily, and we increased amlodipine from 5 to 10 mg daily.    We reviewed his blood pressure logs from home.  Over the last 2 weeks, systolic blood pressure has been trending downward.  Over the last 2 days, his blood pressure has been 124 and 126 systolically when checking at home    We continue to monitor his labs.  Cholesterol is doing well.  His LDL was 76.  He is now taking Lipitor 80 mg daily.    Patient did not tolerate aspirin secondary to hives.  He does continue with Brilinta.  Patient reports he had a nosebleed last night.  Patient is a nosebleeds since his heart attack in December.  He was seen by ENT.  Patient was using some moisturizing solutions inside of his nostrils.  Patient has not been doing this recently.    He has an appointment with cardiology next week      The following portions of the patient's history were reviewed and updated as appropriate: allergies, current medications, past family history, past medical history, past social history, past surgical history, and problem list.    Review of Systems   Constitutional:  Negative for chills and fever.   HENT:  Positive for nosebleeds.    Respiratory:  Negative for cough, shortness of breath and wheezing.    Cardiovascular:  Negative for chest pain, palpitations and leg swelling.   Skin:  Negative for rash.   Neurological:  Negative for dizziness.   Psychiatric/Behavioral:  Negative for sleep disturbance.          Objective:    Physical Exam  Constitutional:       Appearance: Normal appearance.   HENT:      Head: Normocephalic.      Nose: Nose normal. No congestion or rhinorrhea.      Comments: Dried blood inside of the  right nares  Cardiovascular:      Rate and Rhythm: Normal rate and regular rhythm.      Heart sounds: Normal heart sounds. No murmur heard.  Pulmonary:      Effort: Pulmonary effort is normal.      Breath sounds: Normal breath sounds. No wheezing.   Musculoskeletal:      Right lower leg: No edema.      Left lower leg: No edema.   Skin:     General: Skin is warm and dry.   Neurological:      General: No focal deficit present.      Mental Status: He is alert and oriented to person, place, and time.   Psychiatric:         Mood and Affect: Mood normal.         Behavior: Behavior normal.         Assessment:       1. Primary hypertension    2. Coronary artery disease involving native coronary artery of native heart without angina pectoris    3. Pulmonary emphysema, unspecified emphysema type    4. Dyslipidemia          Plan:       Blood pressure  is improving.  Continue with amlodipine, lisinopril, labetalol  CAD is ongoing.  Continue to follow with cardiology next week.  Continue carvedilol, Brilinta, and Lipitor  COPD stable.  Continue with albuterol as needed  Cholesterol is currently at goal.  LDL 76.  Continue with Lipitor.  Recheck labs in 6 months.  Refilled Lipitor 80 mg daily

## 2021-04-28 NOTE — Telephone Encounter (Addendum)
Pcp epic user     Cath/PCI 01/26/21 in [facility]     Some cardio notes; tests to scan

## 2021-04-29 ENCOUNTER — Ambulatory Visit (INDEPENDENT_AMBULATORY_CARE_PROVIDER_SITE_OTHER): Payer: Medicare PPO | Admitting: Family Medicine

## 2021-04-29 ENCOUNTER — Encounter (INDEPENDENT_AMBULATORY_CARE_PROVIDER_SITE_OTHER): Payer: Self-pay | Admitting: Family Medicine

## 2021-04-29 VITALS — BP 132/82 | HR 75 | Temp 98.6°F | Resp 18 | Ht 66.0 in | Wt 156.0 lb

## 2021-04-29 DIAGNOSIS — J439 Emphysema, unspecified: Secondary | ICD-10-CM

## 2021-04-29 DIAGNOSIS — I10 Essential (primary) hypertension: Secondary | ICD-10-CM

## 2021-04-29 DIAGNOSIS — I2510 Atherosclerotic heart disease of native coronary artery without angina pectoris: Secondary | ICD-10-CM

## 2021-04-29 DIAGNOSIS — E785 Hyperlipidemia, unspecified: Secondary | ICD-10-CM

## 2021-04-29 MED ORDER — LABETALOL HCL 100 MG PO TABS
100.0000 mg | ORAL_TABLET | Freq: Two times a day (BID) | ORAL | 3 refills | Status: DC
Start: ? — End: 2021-04-29

## 2021-04-29 MED ORDER — ATORVASTATIN CALCIUM 80 MG PO TABS
ORAL_TABLET | ORAL | 3 refills | Status: DC
Start: ? — End: 2021-04-29

## 2021-05-06 ENCOUNTER — Ambulatory Visit (INDEPENDENT_AMBULATORY_CARE_PROVIDER_SITE_OTHER): Payer: Medicare PPO | Admitting: Interventional Cardiology

## 2021-05-06 ENCOUNTER — Encounter (INDEPENDENT_AMBULATORY_CARE_PROVIDER_SITE_OTHER): Payer: Self-pay | Admitting: Interventional Cardiology

## 2021-05-06 VITALS — BP 132/64 | HR 63 | Ht 66.0 in | Wt 161.2 lb

## 2021-05-06 DIAGNOSIS — I2510 Atherosclerotic heart disease of native coronary artery without angina pectoris: Secondary | ICD-10-CM

## 2021-05-06 NOTE — Progress Notes (Signed)
Cardiology Consult Note      Patient Name: Troy Velasquez   Date of Birth: 08-27-1940    Provider: Herbert Deaner, MD     Patient Care Team:  Sheffield Slider, MD as PCP - General (Family Medicine)    Chief Complaint: Coronary Artery Disease      History of Present Illness   Troy Velasquez is a 81 y.o. male referred for evaluation of CAD. He is not able to tolerate ASA. His blood pressure is well controlled at 132/64.     He reports nose bleeds on Brilinta. He is unable to tolerate ASA.     He still works doing parts delivery.     Review of Systems   Constitution: Negative for activity change, appetite change, fatigue, fever, and unexpected weight change  HENT: Negative for trouble swallowing, and voice change  Eyes: Negative for any visual disturbances  Respiratory: Negative for apnea and/or awakening with sob, chest tightness, and sob  Cardiovascular: Negative for chest pain/discomfort, leg or ankle swelling, difficulty breathing lying down, and palpitations  Gastrointestinal: Negative for abdominal pain, blood in stool, nausea, and vomiting  Endocrine: Negative for cold intolerance, heat intolerance, and polydipsia  GU: Negative for flank pain, frequent urination, and hematuria  Musculoskeletal: Negative for gait problems, joint swelling, neck pain, and pain or heaviness in legs  Skin: Negative for rash, wound, and ulcers on legs/feet  Neurological: Negative for dizziness, headaches, and syncope  Hematologic: Negative for bruises  Psychiatric: Negative for anxiety, and sleep disturbance    Comprehensive review of systems performed by me. Unless otherwise noted, all systems are negative.    Past Medical History     PMHx 05/06/21 DBR/r.[email or web address]  Labs 04/22/21 in Epic     1. CAD s/p NSTEMI   a. LHC 09/18/97: 2 stents to proximal and mid RCA. Dr. Duffy Rhody.   b. LHC 10/2009: Stent to RCA.   c. Echo 01/23/21: EF 50-55%. LV systolic function normal.   d. LHC 01/26/21: DES to mid LCx. RCA with severe diffuse in stent restenosis and  distal RCA occlusion with collaterals that appear chronic. Remainder of his CAD is moderate and diffuse. ([facility])    2. HTN   3. HLD   4. COPD    Past Surgical History     History reviewed. No pertinent surgical history.    Family History     Family History   Problem Relation Age of Onset    No known problems Mother     No known problems Father        Social History     Social History     Tobacco Use    Smoking status: Former    Smokeless tobacco: Never   Haematologist Use: Never used   Substance Use Topics    Alcohol use: Not Currently    Drug use: Not Currently       Allergies     is allergic to aspirin.    Medications       Current Outpatient Medications:     albuterol sulfate HFA (PROVENTIL) 108 (90 Base) MCG/ACT inhaler, INHALE 2 PUFFS INTO THE LUNGS EVERY 6  HOURS AS NEEDED FOR WHEEZING, Disp: 1 each, Rfl: 11    amLODIPine (NORVASC) 10 MG tablet, Take 1 tablet (10 mg) by mouth daily, Disp: 90 tablet, Rfl: 3    atorvastatin (LIPITOR) 80 MG tablet, TAKE 1 TABLET BY MOUTH IN THE EVENING FOR 90 DAYS, Disp:  90 tablet, Rfl: 3    labetalol (NORMODYNE) 100 MG tablet, Take 1 tablet (100 mg) by mouth every 12 (twelve) hours, Disp: 180 tablet, Rfl: 3    lisinopril (ZESTRIL) 20 MG tablet, Take 2 tablets (40 mg) by mouth daily, Disp: 180 tablet, Rfl: 3    ticagrelor (BRILINTA) 90 MG Tab, Take 1 tablet (90 mg) by mouth every 12 (twelve) hours, Disp: 180 tablet, Rfl: 3    Physical Exam     Visit Vitals  BP 132/64   Pulse 63   Ht 1.676 m (5\' 6" )   Wt 73.1 kg (161 lb 3.2 oz)   BMI 26.02 kg/m     Vitals:    05/06/21 1332   BP: 132/64   Pulse: 63   Weight: 73.1 kg (161 lb 3.2 oz)   Height: 1.676 m (5\' 6" )     Wt Readings from Last 3 Encounters:   05/06/21 73.1 kg (161 lb 3.2 oz)   04/29/21 70.8 kg (156 lb)   04/15/21 71.2 kg (157 lb)        Constitutional -  Well appearing, and in no distress  Eyes - Pupils equal and reactive, extraocular eye movements intact, sclera anicteric  Oropharynx - Moist mucous  membranes  Respiratory - Clear to auscultation bilaterally, normal respiratory effort    Cardiovascular system -    Regular rate and rhythm    Normal S1, S2    No murmurs, rubs, gallops   Jugular venous pulse is normal        Carotid upstroke normal, no carotid bruits auscultated   2+ pulses in the posterior tibial / dorsalis pedis bilaterally    Neurological - Alert, oriented, no focal neurological deficits  Extremities -  No clubbing or cyanosis. No peripheral edema  Skin - Warm and dry, no rashes  Psych- Appropriate affect    Labs     Lab Results   Component Value Date/Time    WBC 6.6 04/22/2021 07:04 AM    RBC 4.10 04/22/2021 07:04 AM    HGB 12.4 (L) 04/22/2021 07:04 AM    HCT 37.6 (L) 04/22/2021 07:04 AM    PLT 204 04/22/2021 07:04 AM        Lab Results   Component Value Date/Time    NA 142 04/22/2021 07:04 AM    K 4.6 04/22/2021 07:04 AM    CL 109 04/22/2021 07:04 AM    CO2 24 04/22/2021 07:04 AM    GLU 103 (H) 04/22/2021 07:04 AM    BUN 19 04/22/2021 07:04 AM    CREAT 1.07 04/22/2021 07:04 AM    PROT 6.0 04/22/2021 07:04 AM    ALKPHOS 128 04/22/2021 07:04 AM    AST 38 04/22/2021 07:04 AM    ALT 48 04/22/2021 07:04 AM       Lab Results   Component Value Date/Time    CHOL 127 04/22/2021 07:04 AM    TRIG 84 04/22/2021 07:04 AM    HDL 34 (L) 04/22/2021 07:04 AM    LDL 76 04/22/2021 07:04 AM       No results found for: HGBA1CPERCNT    Cardiogenics:     EKG: NSR. Old IMI. PVC    Impression and Recommendations:     1. Coronary artery disease       - No changes made today   - Continue current medical therapy   - F/U in 6 months       Thank you kindly for referring this patient.  Electronically signed by:  Alcide Goodness, MD    This note was scribed by Derek Mound on behalf of Alcide Goodness, MD

## 2021-05-12 ENCOUNTER — Encounter (INDEPENDENT_AMBULATORY_CARE_PROVIDER_SITE_OTHER): Payer: Self-pay

## 2021-05-12 ENCOUNTER — Encounter (INDEPENDENT_AMBULATORY_CARE_PROVIDER_SITE_OTHER): Payer: Commercial Managed Care - PPO | Admitting: NURSE PRACTITIONER, FAMILY

## 2021-05-15 ENCOUNTER — Encounter (INDEPENDENT_AMBULATORY_CARE_PROVIDER_SITE_OTHER): Payer: Self-pay | Admitting: Family Medicine

## 2021-05-15 ENCOUNTER — Ambulatory Visit (INDEPENDENT_AMBULATORY_CARE_PROVIDER_SITE_OTHER): Payer: Medicare PPO | Admitting: Family

## 2021-05-15 ENCOUNTER — Ambulatory Visit (INDEPENDENT_AMBULATORY_CARE_PROVIDER_SITE_OTHER): Payer: Medicare PPO | Admitting: Family Medicine

## 2021-05-15 VITALS — BP 126/60 | HR 65 | Temp 97.7°F | Resp 19 | Ht 66.0 in | Wt 156.0 lb

## 2021-05-15 DIAGNOSIS — J302 Other seasonal allergic rhinitis: Secondary | ICD-10-CM

## 2021-05-15 DIAGNOSIS — J22 Unspecified acute lower respiratory infection: Secondary | ICD-10-CM

## 2021-05-15 MED ORDER — MONTELUKAST SODIUM 10 MG PO TABS
10.0000 mg | ORAL_TABLET | Freq: Every day | ORAL | 3 refills | Status: DC
Start: ? — End: 2021-05-15

## 2021-05-15 MED ORDER — AZITHROMYCIN 250 MG PO TABS
ORAL_TABLET | ORAL | 0 refills | Status: DC
Start: ? — End: 2021-05-15

## 2021-05-15 NOTE — Progress Notes (Signed)
Subjective:    Patient ID: Troy Velasquez is a 81 y.o. male.    Patient is here today for an acute visit.  Over the last 2 weeks, ongoing rhinorrhea, nasal congestion, watery eyes, itching eyes, and sneezing.  Patient attributed symptoms to seasonal allergies.  He was unsure which medication to take secondary to his coronary disease.  Of last 2 or 3 days, symptoms seem to be into his chest with some cough and yellow sputum production.      The following portions of the patient's history were reviewed and updated as appropriate: allergies, current medications, past family history, past medical history, past social history, past surgical history, and problem list.    Review of Systems   Constitutional:  Negative for chills and fever.   HENT:  Positive for congestion, rhinorrhea and sore throat.    Respiratory:  Negative for shortness of breath.    Cardiovascular:  Negative for chest pain and palpitations.   Skin:  Negative for rash.           Objective:    Physical Exam  Constitutional:       Appearance: Normal appearance.   HENT:      Nose: Congestion and rhinorrhea present.      Mouth/Throat:      Pharynx: Posterior oropharyngeal erythema present.   Cardiovascular:      Rate and Rhythm: Normal rate and regular rhythm.      Heart sounds: Normal heart sounds. No murmur heard.  Pulmonary:      Effort: Pulmonary effort is normal.      Breath sounds: Normal breath sounds.   Skin:     General: Skin is warm and dry.   Neurological:      General: No focal deficit present.      Mental Status: He is alert and oriented to person, place, and time.   Psychiatric:         Mood and Affect: Mood normal.         Behavior: Behavior normal.           Assessment:       1. LRTI (lower respiratory tract infection)    2. Seasonal allergies          Plan:       Azithromycin given for lower respiratory tract infection.  Prescription given for Singulair for seasonal allergies.  Recommended patient also start with over-the-counter Claritin or  Zyrtec

## 2021-07-11 ENCOUNTER — Other Ambulatory Visit (INDEPENDENT_AMBULATORY_CARE_PROVIDER_SITE_OTHER): Payer: Self-pay | Admitting: Family Medicine

## 2021-07-21 ENCOUNTER — Other Ambulatory Visit (INDEPENDENT_AMBULATORY_CARE_PROVIDER_SITE_OTHER): Payer: Self-pay

## 2021-07-21 MED ORDER — LABETALOL HCL 100 MG PO TABS
100.0000 mg | ORAL_TABLET | Freq: Two times a day (BID) | ORAL | 3 refills | Status: DC
Start: ? — End: 2021-07-21

## 2021-07-21 MED ORDER — ATORVASTATIN CALCIUM 80 MG PO TABS
ORAL_TABLET | ORAL | 3 refills | Status: DC
Start: ? — End: 2021-07-21

## 2021-10-10 NOTE — Progress Notes (Unsigned)
[facility]   [address], [address]  [place], [place] [address]  Phone: [phone] * Fax: [phone]      Cardiology Follow-Up    Patient Name: Troy Velasquez   Date of Birth: 1940-03-30    Age: 81 y.o.    Medical Record Number: [id number]      Provider: Barnett Hatter, NP     Patient Care Team:  Sheffield Slider, MD as PCP - General (Family Medicine)  Herbert Deaner, MD as Consulting Physician (Cardiology)    Chief Complaint: Coronary Artery Disease, Hyperlipidemia, and Hypertension    Impression and Recommendations:   1. Coronary artery disease involving native coronary artery of native heart without angina pectoris  Class 0  Clinically Stable, Functionally Stable, Activity level Mildly impaired  Nose bleed with Brilinta, intolerant to ASA  Stop brilinta due to shortness of breath and transition to Plavix    2. Nonrheumatic mitral valve stenosis with insufficiency  TTE [facility] 01/23/2021  mild mitral stenosis, mild MR    3. Primary hypertension  Adequate control, Encouraged to monitor Weekly and with symptoms.  Goal BP <140/80, Goal HR 55-70  Bring home BP monitors annually for correlation    4. Mixed hyperlipidemia  Goal LDL <70.  Close to Goal 04-22-2021  On Statin, Switching Atorvastatin to Rosuvastatin to see if easier to swallow  Labs followed by PCP.       PLAN:    Stop Brilinta due to shortness of breath  Start clopidogrel 75 mg 4 tablets tonight and then once daily  Stop atorvastatin due to size of pill  Start rosuvastatin 40 mg daily  Continue other medications  Labs followed by PCP  76-month follow-up Dr. Pecola Leisure    Given the current circumstances with the Coronavirus, I have encouraged the patient to stay home as much as possible and to practice good hand-washing as well as take other precautionary measures recommended by the CDC.     Patient advised to call for change in cardiac symptoms.  To ER for acute symptoms.    Problem List     Patient Active Problem List   Diagnosis    IFG  (impaired fasting glucose)    Hypertension    Dyslipidemia    COPD (chronic obstructive pulmonary disease)    CAD (coronary artery disease)    Atopic dermatitis, unspecified type    Insomnia    Chest pain    Hypomagnesemia    Type 1 non-ST elevation myocardial infarction (NSTEMI)        History of Present Illness   Troy Velasquez is a 81 y.o. male evaluated today as a 29m cardiology f/u appt for CAD, HTN, HLD    Last cardiology OV:  Dr. Pecola Leisure new patient 05-06-2021.  132/64, 63, 161lb.  EKG SR Inf Q, PVC.  Continue medications.  RTC 25m.  Nosebleed with  Brilinta, intolerant to ASA    Review of Hospital / ER / Urgent care notes since last visit:    --    Review recent outside office notes:  PCP OV 11/02/2021 annual Medicare wellness visit.  Iron deficiency anemia worsening, Protonix, OTC iron.  Repeat labs in 3 months.  Consider GI referral.  Lipid at goal.  120/54, 69, 163.    PCP OV 05/15/2021 LR TI, seasonal allergies.  Z-Pak.  Singulair.  126/60, 65, 156.    Review of diagnostic testing since last visit:  [facility] 01/26/2021 [facility] PCI LCx, RCA diffuse in-stent restenosis and  distal RCA occlusion with collaterals NSTEMI    TTE [facility] 01/23/2021 EF 50 to 55%, normal chamber size, mild mitral stenosis, mild MR, trace TR, mild AV sclerosis    Review labs results:  04/22/2021 WBC 6.6, Hgb 12.4, HCT 34.6, platelet 204, PSA 0.68, TC 127, TG 84, HDL 34, LDL 76, sodium 142, K4.6, glucose 103, BUN 19, creatinine 1.07, LFT unremarkable, GFR 71  01/26/2021 magnesium 1.8  01/22/2021 BNP 140, A1c 5.9, TSH 0.513    In office visit completed with a 15 year old patient who states he is doing reasonably well other than shortness of breath.  He feels it has been since he is on Brilinta.  He previously took clopidogrel and tolerated well.  He is intolerant to aspirin.  His PCP is managing anemia and has him on Protonix and iron supplement.  He denies palpitation, orthopnea, edema.  Energy level is stable.  He monitors blood pressure at home on 2 devices  which have been correlated through PCP per his report.  His home blood pressure and heart rate are reasonably controlled.  Weight is up 7 pounds from last visit which he attributes to eating more.    Labs followed by PCP.  He does tell me that the atorvastatin 80 mg is difficult to swallow so we will try him with rosuvastatin 40 mg daily instead.  He was previously on simvastatin.  He took Brilinta this morning.  I have asked him tonight to take a loading dose of clopidogrel and then start with once daily tomorrow.    Reviewed his echocardiogram from December.  He will follow-up with Dr. Pecola Leisure in 6 months and call if he has change in cardiac symptoms    Total time of visit including review records, visit time, charting: 39 minutes  Social History     Social Hx:   Tobacco:   Quit 2018   Alcohol:     Denies   Recreational Drug:    Denies   Caffeine:    Coffee - 2 cups.  Tea - 1 glass   Activity:      Using cane.  No stairs.  No household chores.  Riding mow, grandson trims.  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  120s / 70s, HR 50s.        Review of Systems   Review of Systems   Constitutional:  Positive for malaise/fatigue (Stable). Negative for fever and weight loss.        Up 7lb since last visit.    HENT:  Negative for nosebleeds.    Respiratory:  Positive for shortness of breath (Noticable since taking Brilinta). Negative for cough, hemoptysis, sputum production and wheezing.    Cardiovascular:  Negative for chest pain, palpitations, orthopnea (Regular bed, 1 pillow), claudication and leg swelling.   Gastrointestinal:  Negative for abdominal pain, blood in stool, constipation, diarrhea, heartburn, nausea and vomiting.   Genitourinary:  Negative for dysuria and hematuria.        Nocturia - 1   Musculoskeletal:  Positive for back pain. Negative for falls, joint pain, myalgias and neck  pain.   Neurological:  Negative for dizziness, loss of consciousness and headaches.   Endo/Heme/Allergies:  Does not bruise/bleed easily.   Psychiatric/Behavioral:  Negative for depression. The patient is not nervous/anxious and does not have insomnia.  Past Medical History   PMH 11/04/21  LOV 05/06/21 DBR  Labs in Epic    1. CAD s/p NSTEMI   a. LHC 09/18/97: 2 stents to proximal and mid RCA. Dr. Duffy Rhody.   b. LHC 10/2009: Stent to RCA.   c. Echo 01/23/21: EF 50-55%. LV systolic function normal.   d. LHC 01/26/21: DES to mid LCx. RCA with severe diffuse in stent restenosis and distal RCA occlusion with collaterals that appear chronic. Remainder of his CAD is moderate and diffuse. ([facility])    2. HTN   3. HLD   4. COPD  Past Surgical History   History reviewed. No pertinent surgical history.  Family History     Family History   Problem Relation Age of Onset    No known problems Mother     No known problems Father        Allergies     Allergies   Allergen Reactions    Aspirin Hives     Medications     Current Outpatient Medications:     albuterol sulfate HFA (PROVENTIL) 108 (90 Base) MCG/ACT inhaler, INHALE 2 PUFFS INTO THE LUNGS EVERY 6  HOURS AS NEEDED FOR WHEEZING, Disp: 1 each, Rfl: 11    amLODIPine (NORVASC) 10 MG tablet, , Disp: , Rfl:     labetalol (NORMODYNE) 100 MG tablet, Take 1 tablet (100 mg) by mouth every 12 (twelve) hours, Disp: 180 tablet, Rfl: 3    lisinopril (ZESTRIL) 20 MG tablet, , Disp: , Rfl:     pantoprazole (Protonix) 40 MG tablet, Take 1 tablet (40 mg) by mouth daily, Disp: 90 tablet, Rfl: 3    clopidogrel (Plavix) 75 mg tablet, Take 4 tablets first dose 12 hours after last dose Brilinta and then 1 pill daily., Disp: 90 tablet, Rfl: 3    rosuvastatin (CRESTOR) 40 MG tablet, Take 1 tablet (40 mg) by mouth daily, Disp: 90 tablet, Rfl: 3  Physical Exam   Visit Vitals  BP 126/64 (BP Site: Left arm, Patient Position: Sitting, Cuff Size: Large)   Pulse (!) 56   Ht 1.676 m (5\' 6" )   Wt 73.9 kg  (163 lb)   BMI 26.31 kg/m     Wt Readings from Last 3 Encounters:   11/04/21 73.9 kg (163 lb)   11/02/21 74.2 kg (163 lb 9.6 oz)   05/15/21 70.8 kg (156 lb)     Physical Exam  Vitals reviewed.   Constitutional:       General: He is not in acute distress.     Appearance: He is well-developed.   HENT:      Head: Normocephalic.   Eyes:      General: No scleral icterus.  Neck:      Vascular: No JVD.   Cardiovascular:      Rate and Rhythm: Normal rate and regular rhythm. No extrasystoles are present.     Pulses:           Carotid pulses are 2+ on the right side and 2+ on the left side.       Radial pulses are 2+ on the right side and 2+ on the left side.        Posterior tibial pulses are 2+ on the right side and 2+ on the left side.      Heart sounds: No murmur heard.  Pulmonary:      Breath sounds: No wheezing or rales.      Comments: Diminished breath sounds  Abdominal:  General: Bowel sounds are normal.      Palpations: Abdomen is soft.      Tenderness: There is no abdominal tenderness.   Musculoskeletal:         General: No swelling.      Cervical back: Neck supple.      Comments: Ambulates with cane   Skin:     General: Skin is warm and dry.   Neurological:      Mental Status: He is alert and oriented to person, place, and time.   Psychiatric:         Mood and Affect: Mood normal.           Labs   No results found for: "HGBA1CPERCNT"  Lab Results   Component Value Date/Time    WBC 7.6 10/29/2021 07:06 AM    RBC 3.74 (L) 10/29/2021 07:06 AM    HGB 11.0 (L) 10/29/2021 07:06 AM    HCT 35.2 (L) 10/29/2021 07:06 AM    PLT 178 10/29/2021 07:06 AM     Lab Results   Component Value Date/Time    NA 141 10/29/2021 07:06 AM    K 4.9 10/29/2021 07:06 AM    CL 109 10/29/2021 07:06 AM    CO2 22 10/29/2021 07:06 AM    GLU 93 10/29/2021 07:06 AM    BUN 20 10/29/2021 07:06 AM    CREAT 1.39 (H) 10/29/2021 07:06 AM    PROT 6.3 10/29/2021 07:06 AM    ALKPHOS 93 10/29/2021 07:06 AM    AST 24 10/29/2021 07:06 AM    ALT 23  10/29/2021 07:06 AM     Lab Results   Component Value Date/Time    CHOL 125 10/29/2021 07:06 AM    TRIG 81 10/29/2021 07:06 AM    HDL 37 (L) 10/29/2021 07:06 AM    LDL 72 10/29/2021 07:06 AM     EKG:   EKG: SB, VR 56, IVCD, inferior Q         This note was completed using dragon medical speech recognition software. Grammatical errors, random word insertions, pronoun errors, incorrect word insertion, misspellings  and incomplete sentences are occasional consequences of this technology due to software limitations. If there are questions or concerns about the content of this note or information contained within the body of this dictation they should be addressed with the provider for clarification.    Electronically signed by:     Barnett Hatteronna M. Tosca Pletz, FNP-C  11/04/2021  [facility]   [address], [address]  [place], [place] [id number]  Phone: [phone] * Fax: [phone]

## 2021-10-29 ENCOUNTER — Other Ambulatory Visit
Admission: RE | Admit: 2021-10-29 | Discharge: 2021-10-29 | Disposition: A | Discharge status: Home / Self Care | Payer: Medicare PPO | Source: Ambulatory Visit | Attending: Family Medicine | Admitting: Family Medicine

## 2021-10-29 DIAGNOSIS — J439 Emphysema, unspecified: Secondary | ICD-10-CM

## 2021-10-29 DIAGNOSIS — I10 Essential (primary) hypertension: Secondary | ICD-10-CM

## 2021-10-29 DIAGNOSIS — E785 Hyperlipidemia, unspecified: Secondary | ICD-10-CM

## 2021-10-29 DIAGNOSIS — I2510 Atherosclerotic heart disease of native coronary artery without angina pectoris: Secondary | ICD-10-CM

## 2021-10-29 LAB — LIPID PANEL
Cholesterol: 125 mg/dL (ref 75–199)
Coronary Heart Disease Risk: 3.38
HDL: 37 mg/dL — ABNORMAL LOW (ref 40–55)
LDL Calculated: 72 mg/dL
Triglycerides: 81 mg/dL (ref 10–150)
VLDL: 16 (ref 0–40)

## 2021-10-29 LAB — COMPREHENSIVE METABOLIC PANEL
ALT: 23 U/L (ref 0–55)
AST (SGOT): 24 U/L (ref 10–42)
Albumin/Globulin Ratio: 1.42 Ratio (ref 0.80–2.00)
Albumin: 3.7 gm/dL (ref 3.5–5.0)
Alkaline Phosphatase: 93 U/L (ref 40–145)
Anion Gap: 14.9 mMol/L (ref 7.0–18.0)
BUN / Creatinine Ratio: 14.4 Ratio (ref 10.0–30.0)
BUN: 20 mg/dL (ref 7–22)
Bilirubin, Total: 0.5 mg/dL (ref 0.1–1.2)
CO2: 22 mMol/L (ref 20–30)
Calcium: 9.1 mg/dL (ref 8.5–10.5)
Chloride: 109 mMol/L (ref 98–110)
Creatinine: 1.39 mg/dL — ABNORMAL HIGH (ref 0.80–1.30)
EGFR: 52 mL/min/{1.73_m2} — ABNORMAL LOW (ref 60–150)
Globulin: 2.6 gm/dL (ref 2.0–4.0)
Glucose: 93 mg/dL (ref 71–99)
Osmolality Calculated: 284 mOsm/kg (ref 275–300)
Potassium: 4.9 mMol/L (ref 3.5–5.3)
Protein, Total: 6.3 gm/dL (ref 6.0–8.3)
Sodium: 141 mMol/L (ref 136–147)

## 2021-10-29 LAB — CBC AND DIFFERENTIAL
Basophils %: 0.9 % (ref 0.0–3.0)
Basophils Absolute: 0.1 10*3/uL (ref 0.0–0.3)
Eosinophils %: 10.2 % — ABNORMAL HIGH (ref 0.0–7.0)
Eosinophils Absolute: 0.8 10*3/uL (ref 0.0–0.8)
Hematocrit: 35.2 % — ABNORMAL LOW (ref 39.0–52.5)
Hemoglobin: 11 gm/dL — ABNORMAL LOW (ref 13.0–17.5)
Lymphocytes Absolute: 1.4 10*3/uL (ref 0.6–5.1)
Lymphocytes: 19 % (ref 15.0–46.0)
MCH: 29 pg (ref 28–35)
MCHC: 31 gm/dL — ABNORMAL LOW (ref 32–36)
MCV: 94 fL (ref 80–100)
MPV: 10.6 fL — ABNORMAL HIGH (ref 6.0–10.0)
Monocytes Absolute: 0.9 10*3/uL (ref 0.1–1.7)
Monocytes: 11.2 % (ref 3.0–15.0)
Neutrophils %: 58.8 % (ref 42.0–78.0)
Neutrophils Absolute: 4.5 10*3/uL (ref 1.7–8.6)
PLT CT: 178 10*3/uL (ref 130–440)
RBC: 3.74 10*6/uL — ABNORMAL LOW (ref 4.00–5.70)
RDW: 12.4 % (ref 11.0–14.0)
WBC: 7.6 10*3/uL (ref 4.0–11.0)

## 2021-10-30 ENCOUNTER — Telehealth (INDEPENDENT_AMBULATORY_CARE_PROVIDER_SITE_OTHER): Payer: Self-pay

## 2021-10-30 NOTE — Telephone Encounter (Signed)
Pcp epic user

## 2021-11-02 ENCOUNTER — Ambulatory Visit (INDEPENDENT_AMBULATORY_CARE_PROVIDER_SITE_OTHER): Payer: Medicare PPO | Admitting: Family Medicine

## 2021-11-02 ENCOUNTER — Encounter (INDEPENDENT_AMBULATORY_CARE_PROVIDER_SITE_OTHER): Payer: Self-pay | Admitting: Family Medicine

## 2021-11-02 VITALS — BP 120/54 | HR 69 | Temp 97.6°F | Resp 18 | Ht 65.3 in | Wt 163.6 lb

## 2021-11-02 DIAGNOSIS — J439 Emphysema, unspecified: Secondary | ICD-10-CM

## 2021-11-02 DIAGNOSIS — I10 Essential (primary) hypertension: Secondary | ICD-10-CM

## 2021-11-02 DIAGNOSIS — Z0000 Encounter for general adult medical examination without abnormal findings: Secondary | ICD-10-CM

## 2021-11-02 DIAGNOSIS — D509 Iron deficiency anemia, unspecified: Secondary | ICD-10-CM

## 2021-11-02 DIAGNOSIS — E785 Hyperlipidemia, unspecified: Secondary | ICD-10-CM

## 2021-11-02 DIAGNOSIS — Z23 Encounter for immunization: Secondary | ICD-10-CM

## 2021-11-02 DIAGNOSIS — I2510 Atherosclerotic heart disease of native coronary artery without angina pectoris: Secondary | ICD-10-CM

## 2021-11-02 MED ORDER — PANTOPRAZOLE SODIUM 40 MG PO TBEC
40.0000 mg | DELAYED_RELEASE_TABLET | Freq: Every day | ORAL | 3 refills | Status: DC
Start: ? — End: 2021-11-02

## 2021-11-02 NOTE — Progress Notes (Unsigned)
Patient gave verbal consent to receive vaccination. Patient was given an IM injection. Patient tolerated well. Patient was given patient education on any side effects.    Aalyssa Elderkin M Aryannah Mohon, MA

## 2021-11-02 NOTE — Progress Notes (Unsigned)
Subjective:    Patient ID: Troy Velasquez is a 81 y.o. male.    In addition to his Medicare annual on his exam, patient had blood work today to review his CAD, hypertension, hyperlipidemia, and COPD.  For most part patient is been doing well.  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  He initially had some bleeding issues with Brilinta but over the last 6 or 7 months denies any nosebleeds, melena, hematochezia.    Patient has been having some increasing dyspnea on exertion and occasional shortness of breath.  Denies any chest pain or palpitations.  Patient quit smoking a few years ago.  He has minimal relief with albuterol.    We did review his labs.  Over the last 6 month, his hemoglobin is decreased 12.4-11.0.  He has no visible signs of bleeding.  He does occasionally get heartburn, acid reflux, and some indigestion with spicy foods or tomato based products.  He does not take any anti-inflammatories    Blood pressure has been stable labetalol, amlodipine, and lisinopril.  Denies swelling in the extremities. Cholesterol is doing well.  LDL 72        The following portions of the patient's history were reviewed and updated as appropriate: allergies, current medications, past family history, past medical history, past social history, past surgical history, and problem list.    Review of Systems   Constitutional:  Negative for chills and fever.   Eyes:  Negative for visual disturbance.   Respiratory:  Positive for shortness of breath. Negative for cough and wheezing.    Cardiovascular:  Negative for chest pain, palpitations and leg swelling.   Gastrointestinal:  Negative for abdominal pain and blood in stool.   Endocrine: Negative for cold intolerance and heat intolerance.   Musculoskeletal:  Positive for arthralgias.   Skin:  Negative for rash.   Neurological:  Negative for dizziness and numbness.   Psychiatric/Behavioral:  Negative for sleep disturbance. The patient is not nervous/anxious.             Objective:    Physical Exam  Constitutional:       Appearance: Normal appearance.   HENT:      Head: Normocephalic.      Mouth/Throat:      Mouth: Mucous membranes are moist.   Cardiovascular:      Rate and Rhythm: Normal rate and regular rhythm.      Heart sounds: Normal heart sounds. No murmur heard.  Pulmonary:      Effort: Pulmonary effort is normal.      Breath sounds: Normal breath sounds. No wheezing or rhonchi.   Musculoskeletal:      Cervical back: Neck supple.      Right lower leg: No edema.      Left lower leg: No edema.   Skin:     General: Skin is warm and dry.   Neurological:      General: No focal deficit present.      Mental Status: He is alert and oriented to person, place, and time.   Psychiatric:         Mood and Affect: Mood normal.         Behavior: Behavior normal.             Assessment:       1. Routine general medical examination at a health care facility    2. Primary hypertension    3. Coronary artery disease involving native coronary  artery of native heart without angina pectoris    4. Pulmonary emphysema, unspecified emphysema type    5. Iron deficiency anemia, unspecified iron deficiency anemia type    6. Immunization due    7. Dyslipidemia          Plan:       Please refer to Medicare annual template  Blood pressure stable.  Continue amlodipine, lisinopril, labetalol  CAD stable.  Follows with cardiology next week.  Continue labetalol, Lipitor, and Brilinta   COPD is stable.  Continue albuterol as needed  Iron deficiency anemia is worsening.  We discussed ordering Protonix.  Recommend taking over-the-counter iron supplementation.  Recheck labs including iron panel and CBC in 3 months.  If blood counts are still low in 3 months, we discussed GI referral for endoscopy  Updated Pneumovax  Cholesterol at goal.  LDL 72.  Continue Lipitor 80 mg daily.  Recheck labs in 6 months          Medicare Annual Wellness Visit Office Note      Patient Name: Troy Velasquez, Troy Velasquez  Primary Care  Physician: Sheffield Slider, MD    Troy Velasquez is a 81 y.o. male who presents today for a Medicare Annual Wellness Visit.     Health Risk Assessment     During the past month, how would you rate your general health?:  Good  Which of the following tasks can you do without assistance - drive or take the bus alone; shop for groceries or clothes; prepare your own meals; do your own housework/laundry; handle your own finances/pay bills; eat, bathe or get around your home?:  Drive or take the bus alone, Do your own housework/laundry, Shop for groceries or clothes, Handle your own finances/pay bills, Prepare your own meals, Eat, bathe, dress or get around your home  Which of the following problems have you been bothered by in the past month - dizzy when standing up; problems using the phone; feeling tired or fatigued; moderate or severe body pain?: Feeling tired or fatigued  Do you exercise for about 20 minutes 3 or more days per week?:  Yes  During the past month was someone available to help if you needed and wanted help?  For example, if you felt nervous, lonely, got sick and had to stay in bed, needed someone to talk to, needed help with daily chores or needed help just taking care of yourself.: Yes  Do you always wear a seat belt?: Yes  Do you have any trouble taking medications the way you have been told to take them?: No  Have you been given any information that can help you with keeping track of your medications?: Yes  Do you have trouble paying for your medications?: No  Have you been given any information that can help you with hazards in your house, such as scatter rugs, furniture, etc?: Yes  Do you feel unsteady when standing or walking?: No  Do you worry about falling?: No  Have you fallen two or more times in the past year?: No  Did you suffer any injuries from your falls in the past year?: No    Additional Concerns    Patient Care Team:  Tyjanae Bartek, Fontaine No, MD as PCP - General (Family Medicine)  Herbert Deaner, MD as Consulting Physician (Cardiology)    Past Medical History:   Diagnosis Date    CAD (coronary artery disease)     COPD (chronic obstructive pulmonary disease)  Dyslipidemia     Hypertension     IFG (impaired fasting glucose)     Insomnia      History reviewed. No pertinent surgical history.  Allergies   Allergen Reactions    Aspirin Hives      Current Outpatient Medications   Medication Sig Dispense Refill    albuterol sulfate HFA (PROVENTIL) 108 (90 Base) MCG/ACT inhaler INHALE 2 PUFFS INTO THE LUNGS EVERY 6  HOURS AS NEEDED FOR WHEEZING 1 each 11    amLODIPine (NORVASC) 10 MG tablet       atorvastatin (LIPITOR) 80 MG tablet TAKE 1 TABLET BY MOUTH IN THE EVENING FOR 90 DAYS 90 tablet 3    Brilinta 90 MG Tab       labetalol (NORMODYNE) 100 MG tablet Take 1 tablet (100 mg) by mouth every 12 (twelve) hours 180 tablet 3    lisinopril (ZESTRIL) 20 MG tablet       pantoprazole (Protonix) 40 MG tablet Take 1 tablet (40 mg) by mouth daily 90 tablet 3     No current facility-administered medications for this visit.      Social History     Tobacco Use    Smoking status: Former    Smokeless tobacco: Never   Haematologist Use: Never used   Substance Use Topics    Alcohol use: Not Currently    Drug use: Not Currently      Family History   Problem Relation Age of Onset    No known problems Mother     No known problems Father         Hospitalizations  hospitalization within past year - discharge diagnosis 01/22/21 for NSTEMI      Depression Screening    The patient was screened for depression using Patient Health Questionnaire (PHQ-9) with negative results.     Over the last 2 weeks, how often have you been bothered by any of the following problems?  Little interest or pleasure in doing things: Not at all  Feeling down, depressed, or hopeless: Not at all  PHQ2 Score: 0  Trouble falling or staying asleep, or sleeping too much: Not at all  Feeling tired or having little energy: Not at all  Poor appetite or  overeating: Not at all  Feeling bad about yourself - or that you are a failure or have let yourself or your family down: Not at all  Trouble concentrating on things, such as reading the newspaper or watching television: Not at all  Moving or speaking so slowly that other people could have noticed. Or the opposite - being so fidgety or restless that you have been moving around a lot more than usual: Not at all  Thoughts that you would be better off dead, or of hurting yourself in some way: Not at all  PHQ Total Score: 0  If you checked off any problems, how difficult have these problems made it for you to do your work, take care of things at home, or get along with other people?: Not difficult at all    Functional Ability  Have you fallen two or more times in the past year?: No  Did you suffer any injuries from your falls in the past year?: No    Hearing:  hearing slightly decreased  Exercise:  Light ( i.e. stretching or slow walking ), exercises 1-2x/week, <30 minutes per day, and walking  ADL's:   Bathing - independent   Dressing - independent  Mobility - independent   Transfer - independent   Eating - independent}   Toileting - independent   ADL assistance provided by not needed    Discussion of Advance Directives: Has no Advanced Directive. Form provided.    Opioid and Substance Use Evaluation  Is the patient currently prescribed opioids? no     Opioid Risk Assessment:   Age 40-45: No    Scoring Total::  0      Assessment    BP 120/54   Pulse 69   Temp 97.6 F (36.4 C) (Temporal)   Resp 18   Ht 1.659 m (5' 5.3")   Wt 74.2 kg (163 lb 9.6 oz)   SpO2 97%   BMI 26.97 kg/m      Vision Screening (required for [facility] only): Vision/Hearing exam:   Vision Screening    Right eye Left eye Both eyes   Without correction      With correction 20/25 20/40 20/25      Screening EKG ([facility] only): not indicated    Evaluation of Cognitive Function    Mood/affect: Appropriate  Appearance: neatly groomed, appropriately and  adequately nourished  Family member/caregiver input: Not present    Mini-Cog 2 of 3 words    Step 1: Three word registration  Version 1: Banana; Sunrise; Chair  Version 2: Leader; Season; Table    Step 2: Clock drawing    Step 3: Three word recall    Scoring:  - word recall: 0-3 points (1 point for each)  - clock draw: 0 or 2 points (normal clock = 2 points)  Total: 0-5 points (< 4 points may indicate need for further evaluation)    Result:  1-2 recalled words and normal clock draw - negative for cognitive impairment    Counseling and Referral of Preventive Services    Cardiovascular Disease Screening Tests  Diabetes Screening  Influenza, Pneumococcal and Hepatitis B Vaccinations and their Administration    Assessment/Plan    1. Routine general medical examination at a health care facility        2. Primary hypertension  Comprehensive metabolic panel      3. Coronary artery disease involving native coronary artery of native heart without angina pectoris  Comprehensive metabolic panel    CBC and differential      4. Pulmonary emphysema, unspecified emphysema type        5. Iron deficiency anemia, unspecified iron deficiency anemia type  CBC and differential    IRON PROFILE      6. Immunization due        7. Dyslipidemia  CANCELED: Lipid panel             Assessment:     1. Routine general medical examination at a health care facility    2. Primary hypertension    3. Coronary artery disease involving native coronary artery of native heart without angina pectoris    4. Pulmonary emphysema, unspecified emphysema type    5. Iron deficiency anemia, unspecified iron deficiency anemia type    6. Immunization due    7. Dyslipidemia        Plan:     Patient Instructions   Men's Preventive Wellness Plan   Today's Date: November 03, 2021    Patient Name:Troy Velasquez    Date of Birth: 1941-02-09     As part of your wellness benefit, Medicare makes many screening tests available to you at no charge.  A complete list of these  tests can be found at their website, [employer]Squad[employer]eshttp://www.medicare.gov/coverage/preventive-visit-and-yearly-wellness-exams.html. Some of these recommendations may not apply to you. After careful consideration of your own personal health needs, the following plan is presented for you:    Body Mass Index   Up to Date November 03, 2021  (BMI):Body mass index is 26.97 kg/m.   Height:Height: 165.9 cm (5' 5.3")  Weight:Weight: 74.2 kg (163 lb 9.6 oz) Annually   Blood Pressure: Up to Date November 03, 2021    BP: 120/54   Every 2 yrs, if BP </= 120/80 mm hg  Annually, if BP >120-139/80-89 mm hg   Cholesterol Testing Up-to-date Lab Results   Component Value Date    HDL 37 (L) 10/29/2021    TRIG 81 10/29/2021     Once every 5 years.    Diabetes Screening Up-to-date Lab Results   Component Value Date    GLU 93 10/29/2021      If prediabetes, one screening every 6 months  Otherwise, one screening every 12 months    Osteoporosis Screening   (Bone Density Measurement)  Up-to-date No results found for this or any previous visit.   Once every 2 years if:   Corticosteroid use for more 3 months  Vertebral abnormalities  Primary hyperparathyroidism   Prostate Cancer Screening Up-to-date Lab Results   Component Value Date    PSA 0.680 04/22/2021    Age 79 and older annually   Colorectal Cancer Screening Up-to-date No results found for: "OCCULTBLD", "FOB"    No results found for: "HMCOLON"  No results found for: "COLOGUARD"       Annually, Fecal Occult Blood Stool (FOBS)  Every 5 yrs, Sigmoidoscopy with FOBS  Every 10 yrs, Colonoscopy  Every 3 yrs, Cologuard   Depression Screening Up to Date November 03, 2021  Annually   Sexually Transmitted Diseases (STDs) & Hep B  Not applicable  Annual screening for syphilis in men at increased risk  Hep B Screening   Annual screening for high risk men  One screening covered once for adults who are not high risk.      HIV Screening  Declined No results found for: "HIV" Annually between ages 15-65    Otherwise, if at increased risk   Alcohol Misuse Screening Up to Date Social History     Substance and Sexual Activity   Alcohol Use Not Currently    Annually   Immunizations:   Pneumovax is due Immunization History   Administered Date(s) Administered    COVID-19 Ad26 vaccine 18 years and above (Janssen) 0.5 mL 06/30/2019    INFLUENZA HIGH DOSE 65 YRS+ 11/28/2014, 11/27/2015    INFLUENZA HIGH DOSE 65 YRS+ Quad 0.7 mL 02/14/2020    Influenza (Im) Preserved TRIVALENT VACCINE 02/25/2014    Pneumococcal Conjugate 20-Valent 11/02/2021    Pneumococcal vaccination: Per Provider recommendation, vaccine history.   Influenza: Annually  Hep B intermediate and high risk patients (includes those with diabetes mellitus).     Advance Directive Up-to-date  Once; update as needed   Diabetes Self-Management Program.  Not applicable  Requires diagnosis of diabetes   Diabetes Prevention Program  Not applicable  BMI 25 or above   AND  A diagnosis of prediabetes    Medical Nutrition Therapy Not applicable  For diabetes and renal disease or had kidney transplant within the past 36 months.    Smoking Cessation Counseling Not applicable Counseling given: Not Answered   Two cessation attempts per year   Each attempt includes up to 4  counseling sessions     Hepatitis C Virus (HCV) Screening Due No results found for: "HCVAB" Once if born between 1945 and 1965 and are not considered high risk  Once regardless of birth year if blood transfusion prior 1992 and current or history of injectable drug use  Annually for continued injectable drug use.    Abdominal aortic aneurysm Screening Not applicable No results found.   Once in lifetime screening:   If family history of AAA  Men age 96-75, who smoked at least 100 cigarettes in their lifetime.    Lung Cancer Screening Not applicable  Age 24-77  Asymptomatic  20+ pack year smoker  Current smoker or quit within last 15 years.      Your major risk factors:       Hypertension     Recommendations for  improvement:    Low cholesterol diet and Exercise     Referrals:    See After Visit Summary orders                During the course of the visit the patient was educated and counseled about appropriate screening and preventive services including:   Pneumococcal vaccine   Diabetes screening    Patient Instructions (the written plan) was given to the patient.         Sheffield Slider, MD

## 2021-11-03 ENCOUNTER — Encounter (INDEPENDENT_AMBULATORY_CARE_PROVIDER_SITE_OTHER): Payer: Self-pay | Admitting: Family Medicine

## 2021-11-03 NOTE — Patient Instructions (Signed)
Men's Preventive Wellness Plan   Today's Date: November 03, 2021    Patient Name:Troy Velasquez    Date of Birth: 1940-10-18     As part of your wellness benefit, Medicare makes many screening tests available to you at no charge.  A complete list of these tests can be found at their website, [employer]es. Some of these recommendations may not apply to you. After careful consideration of your own personal health needs, the following plan is presented for you:    Body Mass Index   Up to Date November 03, 2021  (BMI):Body mass index is 26.97 kg/m.   Height:Height: 165.9 cm (5' 5.3")  Weight:Weight: 74.2 kg (163 lb 9.6 oz) Annually   Blood Pressure: Up to Date November 03, 2021    BP: 120/54   Every 2 yrs, if BP </= 120/80 mm hg  Annually, if BP >120-139/80-89 mm hg   Cholesterol Testing Up-to-date Lab Results   Component Value Date    HDL 37 (L) 10/29/2021    TRIG 81 10/29/2021     Once every 5 years.    Diabetes Screening Up-to-date Lab Results   Component Value Date    GLU 93 10/29/2021      If prediabetes, one screening every 6 months  Otherwise, one screening every 12 months    Osteoporosis Screening   (Bone Density Measurement)  Up-to-date No results found for this or any previous visit.   Once every 2 years if:   Corticosteroid use for more 3 months  Vertebral abnormalities  Primary hyperparathyroidism   Prostate Cancer Screening Up-to-date Lab Results   Component Value Date    PSA 0.680 04/22/2021    Age 79 and older annually   Colorectal Cancer Screening Up-to-date No results found for: "OCCULTBLD", "FOB"    No results found for: "HMCOLON"  No results found for: "COLOGUARD"       Annually, Fecal Occult Blood Stool (FOBS)  Every 5 yrs, Sigmoidoscopy with FOBS  Every 10 yrs, Colonoscopy  Every 3 yrs, Cologuard   Depression Screening Up to Date November 03, 2021  Annually   Sexually Transmitted Diseases (STDs) & Hep B  Not applicable  Annual  screening for syphilis in men at increased risk  Hep B Screening   Annual screening for high risk men  One screening covered once for adults who are not high risk.      HIV Screening  Declined No results found for: "HIV" Annually between ages 15-65   Otherwise, if at increased risk   Alcohol Misuse Screening Up to Date Social History     Substance and Sexual Activity   Alcohol Use Not Currently    Annually   Immunizations:   Pneumovax is due Immunization History   Administered Date(s) Administered   . COVID-19 Ad26 vaccine 18 years and above (Janssen) 0.5 mL 06/30/2019   . INFLUENZA HIGH DOSE 65 YRS+ 11/28/2014, 11/27/2015   . INFLUENZA HIGH DOSE 65 YRS+ Quad 0.7 mL 02/14/2020   . Influenza (Im) Preserved TRIVALENT VACCINE 02/25/2014   . Pneumococcal Conjugate 20-Valent 11/02/2021    Pneumococcal vaccination: Per Provider recommendation, vaccine history.   Influenza: Annually  Hep B intermediate and high risk patients (includes those with diabetes mellitus).     Advance Directive Up-to-date  Once; update as needed   Diabetes Self-Management Program.  Not applicable  Requires diagnosis of diabetes   Diabetes Prevention Program  Not applicable  BMI 25 or above   AND  A diagnosis  of prediabetes    Medical Nutrition Therapy Not applicable  For diabetes and renal disease or had kidney transplant within the past 36 months.    Smoking Cessation Counseling Not applicable Counseling given: Not Answered   Two cessation attempts per year   Each attempt includes up to 4 counseling sessions     Hepatitis C Virus (HCV) Screening Due No results found for: "HCVAB" Once if born between 1945 and 1965 and are not considered high risk  Once regardless of birth year if blood transfusion prior 1992 and current or history of injectable drug use  Annually for continued injectable drug use.    Abdominal aortic aneurysm Screening Not applicable No results found.   Once in lifetime screening:   If family history of AAA  Men age 16-75, who  smoked at least 100 cigarettes in their lifetime.    Lung Cancer Screening Not applicable  Age 79-77  Asymptomatic  20+ pack year smoker  Current smoker or quit within last 15 years.      Your major risk factors:       Hypertension     Recommendations for improvement:    Low cholesterol diet and Exercise     Referrals:    See After Visit Summary orders

## 2021-11-04 ENCOUNTER — Encounter (INDEPENDENT_AMBULATORY_CARE_PROVIDER_SITE_OTHER): Payer: Self-pay | Admitting: Nurse Practitioner

## 2021-11-04 ENCOUNTER — Encounter (INDEPENDENT_AMBULATORY_CARE_PROVIDER_SITE_OTHER): Payer: Self-pay | Admitting: Family Medicine

## 2021-11-04 ENCOUNTER — Ambulatory Visit (INDEPENDENT_AMBULATORY_CARE_PROVIDER_SITE_OTHER): Payer: Medicare PPO | Admitting: Nurse Practitioner

## 2021-11-04 VITALS — BP 126/64 | HR 56 | Ht 66.0 in | Wt 163.0 lb

## 2021-11-04 DIAGNOSIS — I2510 Atherosclerotic heart disease of native coronary artery without angina pectoris: Secondary | ICD-10-CM

## 2021-11-04 DIAGNOSIS — I10 Essential (primary) hypertension: Secondary | ICD-10-CM

## 2021-11-04 DIAGNOSIS — I340 Nonrheumatic mitral (valve) insufficiency: Secondary | ICD-10-CM

## 2021-11-04 DIAGNOSIS — E782 Mixed hyperlipidemia: Secondary | ICD-10-CM

## 2021-11-04 MED ORDER — CLOPIDOGREL BISULFATE 75 MG PO TABS
ORAL_TABLET | ORAL | 3 refills | Status: AC
Start: 2021-11-04 — End: ?

## 2021-11-04 MED ORDER — ROSUVASTATIN CALCIUM 40 MG PO TABS
40.0000 mg | ORAL_TABLET | Freq: Every day | ORAL | 3 refills | Status: AC
Start: 2021-11-04 — End: ?

## 2021-11-05 ENCOUNTER — Encounter (INDEPENDENT_AMBULATORY_CARE_PROVIDER_SITE_OTHER): Payer: Self-pay

## 2021-11-23 ENCOUNTER — Telehealth (INDEPENDENT_AMBULATORY_CARE_PROVIDER_SITE_OTHER): Payer: Self-pay

## 2021-11-23 NOTE — Telephone Encounter (Signed)
Pt called into [facility] regarding his RX (Crestor) he is having side effects of muscle cramping and he stopped taking Rx d/t side effects.    I avised pt I would send message to DP to see if any changes needed to be made and i would call pt back.    Pt stated that we needed to call his house phone after 3:30 pm   House phone [phone]    Please advise of any changes you want done DP.  Thanks,  AM

## 2021-11-23 NOTE — Telephone Encounter (Signed)
Called PT to update about Rx, spoke with PT wife Jola Babinski). I advised her that pt is to hold Rx for 2 weeks and than start taking 2 times a week, she expressed understanding.  AM

## 2021-12-21 ENCOUNTER — Encounter (INDEPENDENT_AMBULATORY_CARE_PROVIDER_SITE_OTHER): Payer: Self-pay

## 2022-02-01 ENCOUNTER — Ambulatory Visit (INDEPENDENT_AMBULATORY_CARE_PROVIDER_SITE_OTHER): Payer: Medicare PPO | Admitting: Family Medicine

## 2022-04-20 ENCOUNTER — Other Ambulatory Visit (HOSPITAL_COMMUNITY): Payer: Self-pay | Admitting: FAMILY PRACTICE

## 2022-04-20 DIAGNOSIS — J441 Chronic obstructive pulmonary disease with (acute) exacerbation: Secondary | ICD-10-CM

## 2022-04-20 DIAGNOSIS — J439 Emphysema, unspecified: Secondary | ICD-10-CM

## 2022-04-20 DIAGNOSIS — R634 Abnormal weight loss: Secondary | ICD-10-CM

## 2022-05-05 ENCOUNTER — Encounter (INDEPENDENT_AMBULATORY_CARE_PROVIDER_SITE_OTHER): Payer: Medicare PPO | Admitting: Interventional Cardiology

## 2022-05-06 ENCOUNTER — Ambulatory Visit
Admission: RE | Admit: 2022-05-06 | Discharge: 2022-05-06 | Disposition: A | Discharge status: Home / Self Care | Payer: Commercial Managed Care - PPO | Source: Ambulatory Visit | Attending: FAMILY PRACTICE | Admitting: FAMILY PRACTICE

## 2022-05-06 ENCOUNTER — Other Ambulatory Visit: Payer: Self-pay

## 2022-05-06 DIAGNOSIS — J441 Chronic obstructive pulmonary disease with (acute) exacerbation: Secondary | ICD-10-CM | POA: Insufficient documentation

## 2022-05-06 DIAGNOSIS — N200 Calculus of kidney: Secondary | ICD-10-CM

## 2022-05-06 DIAGNOSIS — R918 Other nonspecific abnormal finding of lung field: Secondary | ICD-10-CM

## 2022-05-06 DIAGNOSIS — M47814 Spondylosis without myelopathy or radiculopathy, thoracic region: Secondary | ICD-10-CM

## 2022-05-06 DIAGNOSIS — J984 Other disorders of lung: Secondary | ICD-10-CM

## 2022-05-06 DIAGNOSIS — J439 Emphysema, unspecified: Secondary | ICD-10-CM | POA: Insufficient documentation

## 2022-05-06 DIAGNOSIS — I898 Other specified noninfective disorders of lymphatic vessels and lymph nodes: Secondary | ICD-10-CM

## 2022-05-06 DIAGNOSIS — R634 Abnormal weight loss: Secondary | ICD-10-CM | POA: Insufficient documentation

## 2022-05-06 DIAGNOSIS — Z955 Presence of coronary angioplasty implant and graft: Secondary | ICD-10-CM

## 2022-05-06 DIAGNOSIS — I2510 Atherosclerotic heart disease of native coronary artery without angina pectoris: Secondary | ICD-10-CM

## 2022-12-16 ENCOUNTER — Encounter (HOSPITAL_COMMUNITY): Payer: Self-pay | Admitting: Student in an Organized Health Care Education/Training Program

## 2022-12-16 ENCOUNTER — Ambulatory Visit
Payer: Commercial Managed Care - PPO | Attending: Student in an Organized Health Care Education/Training Program | Admitting: Student in an Organized Health Care Education/Training Program

## 2022-12-16 ENCOUNTER — Other Ambulatory Visit: Payer: Self-pay

## 2022-12-16 VITALS — Ht 66.0 in | Wt 162.0 lb

## 2022-12-16 DIAGNOSIS — J342 Deviated nasal septum: Secondary | ICD-10-CM | POA: Insufficient documentation

## 2022-12-16 DIAGNOSIS — Z87891 Personal history of nicotine dependence: Secondary | ICD-10-CM | POA: Insufficient documentation

## 2022-12-16 DIAGNOSIS — Z7902 Long term (current) use of antithrombotics/antiplatelets: Secondary | ICD-10-CM

## 2022-12-16 DIAGNOSIS — H6120 Impacted cerumen, unspecified ear: Secondary | ICD-10-CM | POA: Insufficient documentation

## 2022-12-16 DIAGNOSIS — H6122 Impacted cerumen, left ear: Secondary | ICD-10-CM

## 2022-12-16 DIAGNOSIS — H6123 Impacted cerumen, bilateral: Secondary | ICD-10-CM

## 2022-12-16 DIAGNOSIS — H6121 Impacted cerumen, right ear: Secondary | ICD-10-CM

## 2022-12-16 DIAGNOSIS — R040 Epistaxis: Secondary | ICD-10-CM | POA: Insufficient documentation

## 2022-12-16 NOTE — Progress Notes (Addendum)
ENT, [facility]  [address]  [place] [address]  Operated by [facility]  Progress Note    Name: [name] MRN:  [id number]   Date: 12/16/2022 DOB:  25-Aug-1940 (82 y.o.)     Subjective: [name] is a 82 y.o. male who was last seen on 02/18/2021 for epistaxis. Cauterization was not recommended at that time due to the absence of recurrent epistaxis as well as the patient being on Brilinta and aspirin at that time after a heart attack. He was given a prescription for Afrin for any acute episodes of epistaxis.     He has been experiencing some episodes of epistaxis recently. He notes that the epistaxis is mostly from the right nasal passage. He has been using Afrin and a nose clip any time he has a nosebleed, and it stops the bleeding. The episodes of epistaxis last for about 5 minutes, has nosebleeds about once a week. His most recent episode of Epistaxis was last Thursday (12/09/2022). He is currently on Clopidogrel. He was taken off of Brilinta due to his blood being too thin. He is traveling to [place] and plans to leave on November sixth. He notes that he has a humidifier at home. He has not been moisturizing the nose. He denies any congestion, sinus pain,  trouble swallowing or hearing, voice changes, weight loss, sneezing, itchy eyes or rhinorrhea. Patient is a former smoker, 2PPD for 40 years  He notes consumption of Etoh, heavy use on weekends for about 40 years but none now, unable to quantify.     Physical Exam:             Height: 167.6 cm (5\' 6" ) Weight: 73.5 kg (162 lb) Body mass index is 26.15 kg/m.    General Appearance: Well-appearing 82 y.o. male who is pleasant, cooperative and in no acute distress.  Head: Normocephalic.  Skin: Warm and dry.  Face: Symmetric, and without obvious lesions.   Eyes: Conjunctivae clear, pupils equal and round.  Right ear   Pinna: Normal shape and position.    External auditory canal: Wax plug visualized, cleaned today.     Tympanic membrane:  Intact, translucent, midposition, with good middle ear aeration.  Left ear   Pinna: Normal shape and position.    External auditory canal:  Wax plug visualized, cleaned today.   Tympanic membrane:  Intact, translucent, midposition, with good middle ear aeration.  Nose:  Nasal septum anteriorly deviated left. There is a pinpoint area of exposed vessel on right, see procedure.   Cardiovascular: Upper extremities are warm and well perfused, with no cyanosis of the hands or fingers.  Respiratory: No apparent stridorous breathing. No acute respiratory distress.  Musculoskeletal: Moving all extremities.  Neurologic: Grossly normal. Cranial nerves II-XII grossly intact bilaterally.  Psychiatric:  Alert and with appropriate affect.    Review of Information:  N/A    Special Procedures:  ENT, [facility]  [address]  [place] [address]  Operated by [facility]  Procedure Note    Name: [name] MRN:  [id number]   Date: 12/16/2022 DOB:  16-Sep-1940 (82 y.o.)         Ear Cerumen Removal    Performed by: Jayme Cloud, MD  Authorized by: Jayme Cloud, MD      ENT, [facility]  [address]  [place] [address]  Operated by [facility]  Procedure Note    Name:  [name] MRN:  [id number]   Date: 12/16/2022 DOB:  09/27/1940 (82 y.o.)         Procedure: Cerumen cleaning  Pre-op Dx: Cerumen impaction    Bilateral EAC(s) examined under binocular microscopy.  Cerumen and/or debris was cleaned from the canal(s) using curettes, suction, and alligator forceps.  Patient tolerated procedure well.     Jayme Cloud, MD 12/16/2022 21:51     Jayme Cloud, MD    ENT, [facility]  [address]  [place] [address]  Operated by [facility]  Procedure Note    Name: [name] MRN:  [id number]   Date: 12/16/2022 DOB:  1940-05-30 (82 y.o.)         Epistaxis Mgmt    Performed by: Jayme Cloud,  MD  Authorized by: Jayme Cloud, MD      ENT, [facility]  [address]  [place] [address]  Operated by [facility]  Procedure Note    Name: [name] MRN:  [id number]   Date: 12/16/2022 DOB:  04-Sep-1940 (82 y.o.)         Procedure: Bedside Control of Epistaxis, right   Pre-Procedure Diagnosis: Epistaxis  Post-Procedure Diagnosis: Same    Description of Procedure:     Afrin/lidocaine soaked pledgets were placed bilaterally.  After a few minutes, the pledgets were removed and the nose was examined with the nasal speculum.  No bleeding was seen on the left side.  On the patient's right side, there was bleeding present     The bleeding area was was cauterized with silver nitrate and bacitracin ointment was applied along with surgicel    The patient tolerated the procedure without complication.      Jayme Cloud, MD  12/16/2022, 21:52   Jayme Cloud, MD    Assessment:   [name] is a 82 y.o. male who presents w/      ICD-10-CM    1. Recurrent epistaxis  R04.0 Epistaxis Mgmt      2. Antiplatelet or antithrombotic long-term use  Z79.02 Epistaxis Mgmt      3. Impacted cerumen  H61.20 Ear Cerumen Removal           Plan:  Orders Placed This Encounter    Ear Cerumen Removal    Epistaxis Mgmt     Light cauterization of the exposed vessel completed today on right with placement of Surgicel over the area for bleeding control. I did discuss the risk of nosebleed on blood thinners and in the wintertime with dry heat.    Provided handout on moisturization.  Recommend running a humidifier at night.  Recommend using Vaseline at night to both nostrils.  Recommend use of saline gel and/or spray during the day.  No nose blowing for a week.  Continue over-the-counter Afrin nasal spray to be used in the event of an acute bleed.   Advised him to call sooner if any bleeding or if severe go to the emergency department.    Will clean remaining ear wax on left that was unable to be removed today  at next visit under a microscope.     Return in about 1 month (around 01/16/2023) for ear cleaning, s/p cauterization examination.     I am scribing for, Jayme Cloud, MD for services provided on 12/16/2022.  Gilda Crease, Scribe  Gilda Crease, SCRIBE 12/16/2022 13:08     I personally performed the services described in this documentation, as scribed  in my presence, and it is both accurate  and complete.    Jayme Cloud, MD    Jayme Cloud, MD    CC:    PCP Sid Falcon, MD  [address]  [place] [facility] [address]   Referring Provider No referring provider defined for this encounter.       This note was completed using voice recognition technology. Please excuse any errors that have occurred as a result and contact me if any parts of the note are unclear.

## 2022-12-16 NOTE — Progress Notes (Incomplete)
Last seen 2 years ago after nosebleed, was put on Brilinta after MI  Now taking different blood thinner- clopidogrel; has easy bruising but no other bleeding or bleeding gums  Has not been using humidifier, or vaseline  Has been using Afrin and a nose clip any time he has a nosebleed, and it stops the bleeding. Always on the right side  Epistaxis for about 5 minutes, has nosebleeds about once a week    No congestion, no sinus pain, no   Patient is visiting [place] and wanted check- up before going, concerned "tissue is thin" in nares    Former smoker, 2PPD x 40 years  Etoh, heavy use on weekends for about 40 years but none now. Unable to quantify  Swallowing, hearing ok, no voice changes, no weight loss  No sneezing or itchy eyes or rhinorrhea    Nasal septum anteriorly deviated left  No active bleeding or superficial vessels  B/l ear canals occluded with impacted cerumen            ENT, [facility]  [address]  [place] [address]  Operated by [facility]  New Patient Visit    Name: [name] MRN:  [id number]   Date: 12/16/2022 DOB:  10-06-1940 (82 y.o.)       Reason for Visit:  Chief Complaint   Patient presents with    Epistaxis     Most recent bleed last week, used pressure and nasal spray to stop the bleed, no soreness or pain in the nose/sinus area       History of Present Illness:   [name] is a 82 y.o. male here for an initial visit.  He presents with conditions of ***.      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) (CMS [facility]) 01/22/2021     Added automatically from request for surgery  [id number]      Atopic dermatitis, unspecified type 08/09/2018       Patient History:  Past Medical History:   Diagnosis Date    Coronary artery disease     High cholesterol     HTN (hypertension)     Wears glasses       Past Surgical History:   Procedure Laterality Date    HX CORONARY ARTERY BYPASS GRAFT       Family Medical History:    None          Social History     Socioeconomic History    Marital status: Married   Tobacco Use    Smoking status: Former     Current packs/day: 0.00     Types: Cigarettes     Quit date: 2017     Years since quitting: 7.8    Smokeless tobacco: Never   Substance and Sexual Activity    Alcohol use: No    Drug use: No     Current Outpatient Medications   Medication Sig    atorvastatin (LIPITOR) 80 mg Oral Tablet Take 1 Tablet (80 mg total) by mouth Every evening  for 90 days    benzonatate (TESSALON) 100 mg Oral Capsule Take 1 Capsule (100 mg total) by mouth Every 8 hours as needed for Cough    lisinopriL (PRINIVIL) 20 mg Oral Tablet Take 2 Tablets (40 mg total) by mouth Once a day for 30 days    nitroGLYCERIN (NITROSTAT) 0.4 mg Sublingual Tablet, Sublingual Place 1 Tablet (0.4 mg total) under the tongue Every 5 minutes as needed for Chest pain for up to 3 doses for 3 doses over 15 minutes    oxymetazoline (AFRIN) 0.05 % Nasal Spray, Non-Aerosol Administer 2 Sprays into each nostril Twice per day as needed (for active nosebleed)     Allergies   Allergen Reactions    Aspirin Hives/ Urticaria     S/p desensitization 01/26/21 successful now tolerates       Review of Systems:  Review of Systems    Physical Exam:  Ht 1.676 m (5\' 6" )   Wt 73.5 kg (162 lb)   BMI 26.15 kg/m        ENT Physical Exam    Data Reviewed:  ***    Assessment:   No diagnosis found.     Plan:     No orders of the defined types were placed in this encounter.    No follow-ups on file.    On the day of the encounter, a total of *** minutes was spent on this patient encounter including review of historical information,  examination, documentation and post-visit activities. The time documented excludes procedural time.    Luci Bank, MED STUDENT

## 2022-12-16 NOTE — Patient Instructions (Addendum)
Epistaxis s/p cauterization.   -Patient educated about humidification to promote healing  -Humidifier use on warm setting nightly   -Leave nose completely alone for 48 hours then:  -Apply Ayr or other brand saline gel during day every 3-4hrs, or can also do nasal saline drops every 1-2 hrs.   -Apply Vaseline or Bacitracin at night.    -Precautions: No nose blowing x 1 weeks.  Sneeze with open mouth.    -Follow-up: 3-4 weeks to assess need for repeat cautery.     Afrin (oxymetazoline) alone is very useful in the event of an active nosebleed. Can apply 2 sprays each nostril and hold pressure for 10 minutes if this happens.  However, again, this should only be used for an active bleed.

## 2022-12-16 NOTE — Procedures (Unsigned)
ENT, [facility]  [address]  [place] [address]  Operated by [facility]  Procedure Note    Name: [name] MRN:  [id number]   Date: 12/16/2022 DOB:  11/11/1940 (82 y.o.)         Ear Cerumen Removal    Performed by: Jayme Cloud, MD  Authorized by: Jayme Cloud, MD      ENT, [facility]  [address]  [place] [address]  Operated by [facility]  Procedure Note    Name: [name] MRN:  [id number]   Date: 12/16/2022 DOB:  03/03/1940 (82 y.o.)         Procedure: Cerumen cleaning  Pre-op Dx: Cerumen impaction    Bilateral EAC(s) examined under binocular microscopy.  Cerumen and/or debris was cleaned from the canal(s) using curettes, suction, and alligator forceps.  Patient tolerated procedure well.     Jayme Cloud, MD 12/16/2022 21:51     Jayme Cloud, MD

## 2022-12-16 NOTE — Procedures (Unsigned)
ENT, [facility]  [address]  [place] [address]  Operated by [facility]  Procedure Note    Name: JAQUAYLON WHITLOCK MRN:  [id number]   Date: 12/16/2022 DOB:  December 11, 1940 (82 y.o.)         Epistaxis Mgmt    Performed by: Jayme Cloud, MD  Authorized by: Jayme Cloud, MD      ENT, [facility]  [address]  [place] [address]  Operated by [facility]  Procedure Note    Name: JULIOCESAR NAUTA MRN:  [id number]   Date: 12/16/2022 DOB:  [birth year] (82 y.o.)         Procedure: Bedside Control of Epistaxis, right   Pre-Procedure Diagnosis: Epistaxis  Post-Procedure Diagnosis: Same    Description of Procedure:     Afrin/lidocaine soaked pledgets were placed bilaterally.  After a few minutes, the pledgets were removed and the nose was examined with the nasal speculum.  No bleeding was seen on the left side.  On the patient's right side, there was bleeding present     The bleeding area was was cauterized with silver nitrate and bacitracin ointment was applied along with surgicel    The patient tolerated the procedure without complication.      Jayme Cloud, MD  12/16/2022, 21:52   Jayme Cloud, MD

## 2023-01-18 ENCOUNTER — Encounter (HOSPITAL_COMMUNITY): Payer: Self-pay | Admitting: Student in an Organized Health Care Education/Training Program

## 2023-01-18 ENCOUNTER — Other Ambulatory Visit: Payer: Self-pay

## 2023-01-18 ENCOUNTER — Ambulatory Visit
Payer: Commercial Managed Care - PPO | Attending: Student in an Organized Health Care Education/Training Program | Admitting: Student in an Organized Health Care Education/Training Program

## 2023-01-18 DIAGNOSIS — H6122 Impacted cerumen, left ear: Secondary | ICD-10-CM | POA: Insufficient documentation

## 2023-01-18 DIAGNOSIS — R040 Epistaxis: Secondary | ICD-10-CM | POA: Insufficient documentation

## 2023-01-18 DIAGNOSIS — Z7902 Long term (current) use of antithrombotics/antiplatelets: Secondary | ICD-10-CM

## 2023-01-18 DIAGNOSIS — Z87898 Personal history of other specified conditions: Secondary | ICD-10-CM | POA: Insufficient documentation

## 2023-02-01 ENCOUNTER — Encounter (HOSPITAL_COMMUNITY): Payer: Self-pay | Admitting: Student in an Organized Health Care Education/Training Program

## 2023-02-01 NOTE — Progress Notes (Signed)
ENT, [facility]  [address]  [place] [address]  Operated by [facility]  Progress Note    Name: [name] MRN:  [id number]   Date: 01/18/2023 DOB:  01-01-1941 (82 y.o.)     SUBJECTIVE: This is a 82 y.o. male  for follow-up regarding recurrent nose bleeds and wax buildup in the ears. They report having had a recent nosebleed which they attribute to itching and rubbing their nose. However, they state that the bleeding was only minor. The patient recently went on a trip to [place] and did not experience any nosebleeds during that time.       OBJECTIVE:   Physical Exam    HEENT: Nasal mucosa appears healthy bilaterally without any exposed vessels or active bleeding noted. Nasal valve and nasal cavity well-healed from previous cauterization. No crusting or dryness observed.  Ears: Significant amount of wax buildup removed from the left ear. Tympanic membrane visualized and appears normal after wax removal from the left ear. Right ear canal and tympanic membrane clear without any wax accumulation.         ASSESSMENT:  Cerumen, left - removed.   Epistaxis hx s/p right septum cautery - no bleeding    PLAN:  Return in 6 months for cerumen and epistaxis f/u    Epistasis and cerumen instructions provided. OTC debrox as needed for cerumen. OTC afrin as needed for active bleeding    Jayme Cloud, MD      CC:    PCP Sheffield Slider, MD  [address] [place]  [place] [address]   Referring Provider No referring provider defined for this encounter.       This note was completed using voice recognition technology. Please excuse any errors that have occurred as a result and contact me if any parts of the note are unclear.

## 2023-02-01 NOTE — Procedures (Signed)
ENT, [facility]  [address]  [place] [address]  Operated by [facility]  Procedure Note    Name: ELIJAHA GOLDSON MRN:  [id number]   Date: 01/18/2023 DOB:  May 24, 1940 (82 y.o.)         Ear Cerumen Removal    Performed by: Jayme Cloud, MD  Authorized by: Jayme Cloud, MD      ENT, [facility]  [address]  [place] [address]  Operated by [facility]  Procedure Note    Name: MELVEN MITRANI MRN:  [id number]   Date: 01/18/2023 DOB:  10-Mar-1940 (82 y.o.)         Procedure: Cerumen cleaning  Pre-op Dx: Cerumen impaction    Left EAC(s) examined under binocular microscopy.  Cerumen and/or debris was cleaned from the canal(s) using curettes, suction, and alligator forceps.  Patient tolerated procedure well.     Jayme Cloud, MD
